# Patient Record
Sex: Female | Born: 1935
Health system: Southern US, Community
[De-identification: ages and names within clinical notes are randomized; demographics above are authoritative.]

## PROBLEM LIST (undated history)

## (undated) DIAGNOSIS — I4891 Unspecified atrial fibrillation: Secondary | ICD-10-CM

## (undated) DIAGNOSIS — J449 Chronic obstructive pulmonary disease, unspecified: Secondary | ICD-10-CM

## (undated) DIAGNOSIS — J45909 Unspecified asthma, uncomplicated: Secondary | ICD-10-CM

## (undated) DIAGNOSIS — F0390 Unspecified dementia without behavioral disturbance: Secondary | ICD-10-CM

---

## 2013-11-10 ENCOUNTER — Emergency Department (HOSPITAL_BASED_OUTPATIENT_CLINIC_OR_DEPARTMENT_OTHER): Payer: Medicare Other

## 2013-11-10 ENCOUNTER — Encounter (HOSPITAL_BASED_OUTPATIENT_CLINIC_OR_DEPARTMENT_OTHER): Payer: Self-pay | Admitting: Emergency Medicine

## 2013-11-10 ENCOUNTER — Inpatient Hospital Stay (HOSPITAL_BASED_OUTPATIENT_CLINIC_OR_DEPARTMENT_OTHER)
Admission: EM | Admit: 2013-11-10 | Discharge: 2013-11-14 | DRG: 492 | Disposition: A | Discharge status: Skilled Nursing Facility | Payer: Medicare Other | Attending: Internal Medicine | Admitting: Internal Medicine

## 2013-11-10 DIAGNOSIS — I4892 Unspecified atrial flutter: Secondary | ICD-10-CM | POA: Diagnosis not present

## 2013-11-10 DIAGNOSIS — J9600 Acute respiratory failure, unspecified whether with hypoxia or hypercapnia: Secondary | ICD-10-CM | POA: Diagnosis not present

## 2013-11-10 DIAGNOSIS — Z9119 Patient's noncompliance with other medical treatment and regimen: Secondary | ICD-10-CM

## 2013-11-10 DIAGNOSIS — F411 Generalized anxiety disorder: Secondary | ICD-10-CM | POA: Diagnosis present

## 2013-11-10 DIAGNOSIS — S82842A Displaced bimalleolar fracture of left lower leg, initial encounter for closed fracture: Secondary | ICD-10-CM

## 2013-11-10 DIAGNOSIS — F03918 Unspecified dementia, unspecified severity, with other behavioral disturbance: Secondary | ICD-10-CM | POA: Diagnosis present

## 2013-11-10 DIAGNOSIS — J8410 Pulmonary fibrosis, unspecified: Secondary | ICD-10-CM | POA: Diagnosis present

## 2013-11-10 DIAGNOSIS — F0391 Unspecified dementia with behavioral disturbance: Secondary | ICD-10-CM | POA: Diagnosis present

## 2013-11-10 DIAGNOSIS — W19XXXA Unspecified fall, initial encounter: Secondary | ICD-10-CM | POA: Diagnosis present

## 2013-11-10 DIAGNOSIS — J449 Chronic obstructive pulmonary disease, unspecified: Secondary | ICD-10-CM | POA: Diagnosis present

## 2013-11-10 DIAGNOSIS — S82843A Displaced bimalleolar fracture of unspecified lower leg, initial encounter for closed fracture: Principal | ICD-10-CM | POA: Diagnosis present

## 2013-11-10 DIAGNOSIS — Z79899 Other long term (current) drug therapy: Secondary | ICD-10-CM

## 2013-11-10 DIAGNOSIS — J4489 Other specified chronic obstructive pulmonary disease: Secondary | ICD-10-CM | POA: Diagnosis present

## 2013-11-10 DIAGNOSIS — J811 Chronic pulmonary edema: Secondary | ICD-10-CM | POA: Diagnosis present

## 2013-11-10 DIAGNOSIS — R0902 Hypoxemia: Secondary | ICD-10-CM

## 2013-11-10 DIAGNOSIS — Z91199 Patient's noncompliance with other medical treatment and regimen due to unspecified reason: Secondary | ICD-10-CM

## 2013-11-10 DIAGNOSIS — Z66 Do not resuscitate: Secondary | ICD-10-CM | POA: Diagnosis present

## 2013-11-10 DIAGNOSIS — J849 Interstitial pulmonary disease, unspecified: Secondary | ICD-10-CM

## 2013-11-10 HISTORY — DX: Chronic obstructive pulmonary disease, unspecified: J44.9

## 2013-11-10 HISTORY — DX: Unspecified dementia, unspecified severity, without behavioral disturbance, psychotic disturbance, mood disturbance, and anxiety: F03.90

## 2013-11-10 LAB — CBC
HEMATOCRIT: 43.2 % (ref 36.0–46.0)
HEMOGLOBIN: 14 g/dL (ref 12.0–15.0)
MCH: 30.8 pg (ref 26.0–34.0)
MCHC: 32.4 g/dL (ref 30.0–36.0)
MCV: 94.9 fL (ref 78.0–100.0)
Platelets: 228 10*3/uL (ref 150–400)
RBC: 4.55 MIL/uL (ref 3.87–5.11)
RDW: 13.1 % (ref 11.5–15.5)
WBC: 7.2 10*3/uL (ref 4.0–10.5)

## 2013-11-10 LAB — DIFFERENTIAL
BASOS PCT: 0 % (ref 0–1)
Basophils Absolute: 0 10*3/uL (ref 0.0–0.1)
EOS PCT: 2 % (ref 0–5)
Eosinophils Absolute: 0.1 10*3/uL (ref 0.0–0.7)
LYMPHS ABS: 2.6 10*3/uL (ref 0.7–4.0)
Lymphocytes Relative: 36 % (ref 12–46)
MONO ABS: 0.6 10*3/uL (ref 0.1–1.0)
Monocytes Relative: 8 % (ref 3–12)
NEUTROS ABS: 3.9 10*3/uL (ref 1.7–7.7)
Neutrophils Relative %: 54 % (ref 43–77)

## 2013-11-10 LAB — URINALYSIS, ROUTINE W REFLEX MICROSCOPIC
Glucose, UA: NEGATIVE mg/dL
Hgb urine dipstick: NEGATIVE
KETONES UR: 40 mg/dL — AB
Leukocytes, UA: NEGATIVE
NITRITE: NEGATIVE
PROTEIN: 30 mg/dL — AB
Specific Gravity, Urine: 1.027 (ref 1.005–1.030)
Urobilinogen, UA: 1 mg/dL (ref 0.0–1.0)
pH: 5.5 (ref 5.0–8.0)

## 2013-11-10 LAB — URINE MICROSCOPIC-ADD ON

## 2013-11-10 LAB — BASIC METABOLIC PANEL
BUN: 20 mg/dL (ref 6–23)
CO2: 26 mEq/L (ref 19–32)
CREATININE: 0.9 mg/dL (ref 0.50–1.10)
Calcium: 9.5 mg/dL (ref 8.4–10.5)
Chloride: 102 mEq/L (ref 96–112)
GFR calc Af Amer: 67 mL/min — ABNORMAL LOW (ref >90)
GFR calc non Af Amer: 58 mL/min — ABNORMAL LOW (ref >90)
Glucose, Bld: 108 mg/dL — ABNORMAL HIGH (ref 70–99)
Potassium: 3.7 mEq/L (ref 3.7–5.3)
Sodium: 145 mEq/L (ref 137–147)

## 2013-11-10 LAB — PRO B NATRIURETIC PEPTIDE: PRO B NATRI PEPTIDE: 1284 pg/mL — AB (ref 0–450)

## 2013-11-10 LAB — TROPONIN I: Troponin I: <0.3 ng/mL (ref <0.30)

## 2013-11-10 NOTE — ED Provider Notes (Signed)
CSN: [id number]     Arrival date & time 11/10/13  1946 History  This chart was scribed for Brooke B. Bernette Mayers, MD by Danella Maiers, ED Scribe. This patient was seen in room MH10/MH10 and the patient's care was started at 7:47 PM.   Chief Complaint  Patient presents with  . Fall  . Leg Pain   The history is provided by the patient and a relative. No language interpreter was used.   HPI Comments: Level 5 caveat due to ?dementia Brooke Phelps is a 80 y.o. female who presents to the Emergency Department complaining of left ankle pain with associated swelling this morning after a fall. Pt has had difficulty bearing weight on the left leg since that time. She denies hitting her head, LOC, neck pain.   Past Medical History  Diagnosis Date  . COPD (chronic obstructive pulmonary disease)   . Dementia    History reviewed. No pertinent past surgical history. History reviewed. No pertinent family history. History  Substance Use Topics  . Smoking status: Never Smoker   . Smokeless tobacco: Not on file  . Alcohol Use: No   OB History   Grav Para Term Preterm Abortions TAB SAB Ect Mult Living                 Review of Systems  Musculoskeletal: Positive for arthralgias (left ankle). Negative for neck pain.  Neurological: Negative for syncope.  Unable to fully assess due to mental status.   Allergies  Review of patient's allergies indicates not on file.  Home Medications   Current Outpatient Rx  Name  Route  Sig  Dispense  Refill  . ALPRAZolam (XANAX) 0.5 MG tablet   Oral   Take 0.5 mg by mouth as needed for anxiety.         Marland Kitchen PARoxetine (PAXIL) 10 MG tablet   Oral   Take 10 mg by mouth daily.          BP 155/75  Pulse 90  Temp(Src) 98 F (36.7 C) (Oral)  Resp 16  Ht 5\' 2"  (1.575 m)  Wt 150 lb (68.04 kg)  BMI 27.43 kg/m2  SpO2 89% Physical Exam  Nursing note and vitals reviewed. Constitutional: She appears well-developed and well-nourished.  HENT:  Head:  Normocephalic and atraumatic.  Eyes: EOM are normal. Pupils are equal, round, and reactive to light.  Neck: Normal range of motion. Neck supple.  Cardiovascular: Normal rate, normal heart sounds and intact distal pulses.   Pulmonary/Chest: Effort normal. No respiratory distress. She has no wheezes. She has no rales.  Rhonchi  Abdominal: Bowel sounds are normal. She exhibits no distension. There is no tenderness.  Musculoskeletal: She exhibits tenderness. She exhibits no edema.  left ankle swollen, ttp, ecchymosis.   Neurological: She is alert. She has normal strength. No cranial nerve deficit or sensory deficit.  Skin: Skin is warm and dry. No rash noted.  Psychiatric: She has a normal mood and affect.    ED Course  Procedures (including critical care time) Medications - No data to display  DIAGNOSTIC STUDIES: Oxygen Saturation is 89% on RA, low by my interpretation.    COORDINATION OF CARE: 8:03 PM- Discussed treatment plan with pt which includes ankle x-ray. Pt agrees to plan.    Labs Review Labs Reviewed  BASIC METABOLIC PANEL - Abnormal; Notable for the following:    Glucose, Bld 108 (*)    GFR calc non Af Amer 58 (*)    GFR calc  Af Amer 67 (*)    All other components within normal limits  URINALYSIS, ROUTINE W REFLEX MICROSCOPIC - Abnormal; Notable for the following:    Color, Urine AMBER (*)    APPearance CLOUDY (*)    Bilirubin Urine MODERATE (*)    Ketones, ur 40 (*)    Protein, ur 30 (*)    All other components within normal limits  URINE MICROSCOPIC-ADD ON - Abnormal; Notable for the following:    Squamous Epithelial / LPF FEW (*)    Bacteria, UA MANY (*)    All other components within normal limits  TROPONIN I  CBC  DIFFERENTIAL  CBC WITH DIFFERENTIAL   Imaging Review Dg Chest 2 View  11/10/2013   CLINICAL DATA:  Shortness of breath, cough.  EXAM: CHEST  2 VIEW  COMPARISON:  None.  FINDINGS: The heart size and mediastinal contours are within normal limits.  No pneumothorax or pleural effusion is noted. Small calcified granuloma is noted in right midlung. Mild central pulmonary vascular congestion is noted with possible bilateral perihilar edema. The visualized skeletal structures are unremarkable.  IMPRESSION: Mild central pulmonary vascular congestion with possible mild bilateral perihilar edema.   Electronically Signed   By: Roque Lias M.D.   On: 11/10/2013 21:24   Dg Ankle Complete Left  11/10/2013   CLINICAL DATA:  Traumatic injury with pain  EXAM: LEFT ANKLE COMPLETE - 3+ VIEW  COMPARISON:  None.  FINDINGS: Diffuse soft tissue swelling is identified. There is an oblique fracture through the distal fibula with mild displacement. Additionally a fracture through the medial malleolus is noted.  IMPRESSION: Bimalleolar fracture with generalized soft tissue swelling.   Electronically Signed   By: Alcide Clever M.D.   On: 11/10/2013 20:24   Ct Head Wo Contrast  11/10/2013   CLINICAL DATA:  Post fall, history of dementia  EXAM: CT HEAD WITHOUT CONTRAST  TECHNIQUE: Contiguous axial images were obtained from the base of the skull through the vertex without intravenous contrast.  COMPARISON:  None.  FINDINGS: Advanced atrophy with sulcal prominence and centralized volume loss with mild-to-moderate commensurate ex vacuo dilatation of the ventricular system. Periventricular hypodensities compatible with microvascular ischemic disease. Given background parenchymal abnormalities, there is no CT evidence of acute superimposed large territory infarct. No intraparenchymal or extra-axial mass or hemorrhage. Incidental note is made of a septum cavum pellucidum. Otherwise, normal configuration of the ventricles and basal cisterns. No midline shift. Intracranial atherosclerosis. An air-fluid level is noted within the left sphenoid sinus. The remaining paranasal sinuses and mastoid air cells appear normally aerated. Regional soft tissues appear normal. Post bilateral cataract  surgery. No displaced calvarial fracture.  IMPRESSION: Advanced atrophy and microvascular ischemic disease without acute intracranial process.   Electronically Signed   By: Simonne Come M.D.   On: 11/10/2013 21:32    EKG Interpretation    Date/Time:  Friday November 10 2013 21:02:01 EST Ventricular Rate:  85 PR Interval:  136 QRS Duration: 78 QT Interval:  394 QTC Calculation: 468 R Axis:   16 Text Interpretation:  Normal sinus rhythm Normal ECG No old tracing to compare Confirmed by [facility]  MD, Brooke [phone]) on 11/10/2013 9:09:02 PM            MDM   1. Bimalleolar fracture, left, closed, initial encounter   2. Hypoxia   3. ILD (interstitial lung disease)     Per daughter-in-law at bedside who is an Charity fundraiser, patient and husband have been living independently in [place]  until recently when family became concerned about their welfare. They had gone to their house yesterday to move them back to the [place] area when the patient had her fall. The DIL is also concerned about patient developing dementia, but states she has no regular medical care. A doctor there recently started her on Paxil for unclear reason and daughter gave her Xanax to facilitate moving her to this area. The patient is hypoxic on RA with recent coughing per daughter. No known fever. She has prior diagnosis of COPD, but has never been a smoker. Had previous admission for UTI as well. Xray images reviewed, will place patient in splint, due to hypoxia and other medical issues will expand workup to include labs, EKG, CXR and Head CT. Anticipate she will require admission for further evaluation of her multiple problems.    I personally performed the services described in this documentation, which was scribed in my presence. The recorded information has been reviewed and is accurate.      Brooke B. Bernette MayersSheldon, MD 11/11/13 1350

## 2013-11-10 NOTE — Progress Notes (Signed)
Performed left brachial arterial puncture to obtain blood for labs.  Pressure held until bleeding stopped.

## 2013-11-10 NOTE — ED Notes (Signed)
Patient transported to X-ray 

## 2013-11-10 NOTE — Progress Notes (Signed)
PENDING ACCEPTANCE TRANFER NOTE:  Call received from:   Dr Bernette [facility]ldon of [facility].  REASON FOR REQUESTING TRANSFER:  No local MD here, having fallen and Fx ankle.    HPI: Elderly patient, fell and hx of her ankle.  Also found to have COPD and a little Hypoxia.  She needs ortho consult, and EDP will speak with ortho.  Will need local set up for her.   PLAN:  According to telephone report, this patient was accepted for transfer to [facility],   [facility] team: 10,  I have requested an order be written to call Flow Manager at [phone] upon patient arrival to the floor for final physician assignment who will do the admission and give admitting orders.  SIGNEHouston Siren: Luciann Gossett, MD [facility]  11/10/2013, 11:08 PM

## 2013-11-10 NOTE — ED Notes (Signed)
Pt family states that this morning she fell, due to pain in her left leg. Pt has been complaining of pain to her left leg for a week. Pt family this evening noticed that her left ankle was swollen and pt could not put any pressure down on her left leg. Pt has pain to touch of left ankle and left calf.

## 2013-11-11 ENCOUNTER — Encounter (HOSPITAL_COMMUNITY): Payer: Self-pay | Admitting: Internal Medicine

## 2013-11-11 ENCOUNTER — Inpatient Hospital Stay (HOSPITAL_COMMUNITY): Payer: Medicare Other | Admitting: Certified Registered Nurse Anesthetist

## 2013-11-11 ENCOUNTER — Encounter (HOSPITAL_COMMUNITY): Payer: Medicare Other | Admitting: Certified Registered Nurse Anesthetist

## 2013-11-11 ENCOUNTER — Encounter (HOSPITAL_COMMUNITY)
Admission: EM | Disposition: A | Discharge status: Skilled Nursing Facility | Payer: Self-pay | Source: Home / Self Care | Attending: Internal Medicine

## 2013-11-11 DIAGNOSIS — S82843A Displaced bimalleolar fracture of unspecified lower leg, initial encounter for closed fracture: Principal | ICD-10-CM

## 2013-11-11 DIAGNOSIS — F0391 Unspecified dementia with behavioral disturbance: Secondary | ICD-10-CM | POA: Diagnosis present

## 2013-11-11 DIAGNOSIS — J8410 Pulmonary fibrosis, unspecified: Secondary | ICD-10-CM

## 2013-11-11 DIAGNOSIS — R0902 Hypoxemia: Secondary | ICD-10-CM

## 2013-11-11 DIAGNOSIS — J849 Interstitial pulmonary disease, unspecified: Secondary | ICD-10-CM | POA: Diagnosis present

## 2013-11-11 DIAGNOSIS — F03918 Unspecified dementia, unspecified severity, with other behavioral disturbance: Secondary | ICD-10-CM | POA: Diagnosis present

## 2013-11-11 HISTORY — PX: ORIF ANKLE FRACTURE: SHX5408

## 2013-11-11 LAB — CBC
HCT: 41.4 % (ref 36.0–46.0)
Hemoglobin: 13.9 g/dL (ref 12.0–15.0)
MCH: 31.7 pg (ref 26.0–34.0)
MCHC: 33.6 g/dL (ref 30.0–36.0)
MCV: 94.3 fL (ref 78.0–100.0)
PLATELETS: 261 10*3/uL (ref 150–400)
RBC: 4.39 MIL/uL (ref 3.87–5.11)
RDW: 13.2 % (ref 11.5–15.5)
WBC: 6.1 10*3/uL (ref 4.0–10.5)

## 2013-11-11 LAB — GLUCOSE, CAPILLARY: Glucose-Capillary: 112 mg/dL — ABNORMAL HIGH (ref 70–99)

## 2013-11-11 LAB — CREATININE, SERUM
CREATININE: 0.77 mg/dL (ref 0.50–1.10)
GFR, EST AFRICAN AMERICAN: 88 mL/min — AB (ref >90)
GFR, EST NON AFRICAN AMERICAN: 76 mL/min — AB (ref >90)

## 2013-11-11 SURGERY — OPEN REDUCTION INTERNAL FIXATION (ORIF) ANKLE FRACTURE
Anesthesia: Monitor Anesthesia Care | Site: Ankle | Laterality: Left

## 2013-11-11 MED ORDER — CEFAZOLIN SODIUM-DEXTROSE 2-3 GM-% IV SOLR
INTRAVENOUS | Status: AC
  Administered 2013-11-11: 09:00:00 2 g via INTRAVENOUS
  Filled 2013-11-11: qty 50

## 2013-11-11 MED ORDER — ONDANSETRON HCL 4 MG/2ML IJ SOLN
4.0000 mg | Freq: Once | INTRAMUSCULAR | Status: DC | PRN
Start: 2013-11-11 — End: 2013-11-11

## 2013-11-11 MED ORDER — ONDANSETRON HCL 4 MG PO TABS: 4.0000 mg | ORAL_TABLET | Freq: Four times a day (QID) | ORAL | Status: DC | PRN

## 2013-11-11 MED ORDER — ONDANSETRON HCL 4 MG/2ML IJ SOLN: 4.0000 mg | Freq: Four times a day (QID) | INTRAMUSCULAR | Status: DC | PRN

## 2013-11-11 MED ORDER — MEPERIDINE HCL 25 MG/ML IJ SOLN: 6.2500 mg | INTRAMUSCULAR | Status: DC | PRN

## 2013-11-11 MED ORDER — METOCLOPRAMIDE HCL 5 MG/ML IJ SOLN
5.0000 mg | Freq: Three times a day (TID) | INTRAMUSCULAR | Status: DC | PRN
  Filled 2013-11-11: qty 2

## 2013-11-11 MED ORDER — HYDROCODONE-ACETAMINOPHEN 5-325 MG PO TABS
1.0000 | ORAL_TABLET | ORAL | Status: DC | PRN
  Administered 2013-11-11 – 2013-11-13 (×6): 2 via ORAL
  Filled 2013-11-11 (×6): qty 2

## 2013-11-11 MED ORDER — PAROXETINE HCL 10 MG PO TABS
10.0000 mg | ORAL_TABLET | Freq: Every day | ORAL | Status: DC
  Administered 2013-11-12 – 2013-11-14 (×3): 10 mg via ORAL
  Filled 2013-11-11 (×4): qty 1

## 2013-11-11 MED ORDER — OXYCODONE HCL 5 MG PO TABS: 5.0000 mg | ORAL_TABLET | Freq: Once | ORAL | Status: DC | PRN

## 2013-11-11 MED ORDER — FENTANYL CITRATE 0.05 MG/ML IJ SOLN
INTRAMUSCULAR | Status: DC | PRN
  Administered 2013-11-11 (×4): 25 ug via INTRAVENOUS

## 2013-11-11 MED ORDER — HYDROMORPHONE HCL PF 1 MG/ML IJ SOLN: 1.0000 mg | INTRAMUSCULAR | Status: DC | PRN

## 2013-11-11 MED ORDER — CEFAZOLIN SODIUM-DEXTROSE 2-3 GM-% IV SOLR
2.0000 g | Freq: Four times a day (QID) | INTRAVENOUS | Status: AC
  Administered 2013-11-11 – 2013-11-12 (×3): 2 g via INTRAVENOUS
  Filled 2013-11-11 (×4): qty 50

## 2013-11-11 MED ORDER — DEXTROSE-NACL 5-0.9 % IV SOLN
INTRAVENOUS | Status: DC
  Administered 2013-11-11 – 2013-11-13 (×2): via INTRAVENOUS

## 2013-11-11 MED ORDER — OXYCODONE HCL 5 MG/5ML PO SOLN: 5.0000 mg | Freq: Once | ORAL | Status: DC | PRN

## 2013-11-11 MED ORDER — PROPOFOL 10 MG/ML IV BOLUS
INTRAVENOUS | Status: DC | PRN
  Administered 2013-11-11: 20 mg via INTRAVENOUS

## 2013-11-11 MED ORDER — HYDROMORPHONE HCL PF 1 MG/ML IJ SOLN: 0.2500 mg | INTRAMUSCULAR | Status: DC | PRN

## 2013-11-11 MED ORDER — HEPARIN SODIUM (PORCINE) 5000 UNIT/ML IJ SOLN
5000.0000 [IU] | Freq: Three times a day (TID) | INTRAMUSCULAR | Status: DC
  Administered 2013-11-11 – 2013-11-14 (×8): 5000 [IU] via SUBCUTANEOUS
  Filled 2013-11-11 (×13): qty 1

## 2013-11-11 MED ORDER — BUPIVACAINE-EPINEPHRINE PF 0.5-1:200000 % IJ SOLN
INTRAMUSCULAR | Status: DC | PRN
  Administered 2013-11-11: 30 mL via PERINEURAL

## 2013-11-11 MED ORDER — LIDOCAINE-EPINEPHRINE (PF) 1.5 %-1:200000 IJ SOLN
INTRAMUSCULAR | Status: DC | PRN
  Administered 2013-11-11: 30 mL via PERINEURAL

## 2013-11-11 MED ORDER — ALPRAZOLAM 0.5 MG PO TABS
0.5000 mg | ORAL_TABLET | Freq: Three times a day (TID) | ORAL | Status: DC | PRN
  Administered 2013-11-13 – 2013-11-14 (×3): 0.5 mg via ORAL
  Filled 2013-11-11 (×2): qty 1
  Filled 2013-11-11: qty 2

## 2013-11-11 MED ORDER — DOCUSATE SODIUM 100 MG PO CAPS
100.0000 mg | ORAL_CAPSULE | Freq: Two times a day (BID) | ORAL | Status: DC
  Administered 2013-11-12 – 2013-11-14 (×5): 100 mg via ORAL
  Filled 2013-11-11 (×9): qty 1

## 2013-11-11 MED ORDER — SODIUM CHLORIDE 0.9 % IV SOLN
INTRAVENOUS | Status: DC
  Administered 2013-11-11: 23:00:00 via INTRAVENOUS

## 2013-11-11 MED ORDER — LACTATED RINGERS IV SOLN
INTRAVENOUS | Status: DC | PRN
  Administered 2013-11-11: 09:00:00 via INTRAVENOUS

## 2013-11-11 MED ORDER — 0.9 % SODIUM CHLORIDE (POUR BTL) OPTIME
TOPICAL | Status: DC | PRN
  Administered 2013-11-11: 1000 mL

## 2013-11-11 MED ORDER — METOPROLOL TARTRATE 1 MG/ML IV SOLN
5.0000 mg | Freq: Three times a day (TID) | INTRAVENOUS | Status: DC
Start: 2013-11-11 — End: 2013-11-12
  Administered 2013-11-11 – 2013-11-12 (×2): 5 mg via INTRAVENOUS
  Filled 2013-11-11 (×5): qty 5

## 2013-11-11 MED ORDER — METOCLOPRAMIDE HCL 10 MG PO TABS
5.0000 mg | ORAL_TABLET | Freq: Three times a day (TID) | ORAL | Status: DC | PRN
  Filled 2013-11-11: qty 1

## 2013-11-11 SURGICAL SUPPLY — 52 items
BANDAGE ESMARK 6X9 LF (GAUZE/BANDAGES/DRESSINGS) IMPLANT
BANDAGE GAUZE ELAST BULKY 4 IN (GAUZE/BANDAGES/DRESSINGS) ×1 IMPLANT
BIT DRILL 2.5X2.75 QC CALB (BIT) ×1 IMPLANT
BNDG CMPR 9X6 STRL LF SNTH (GAUZE/BANDAGES/DRESSINGS) ×1
BNDG COHESIVE 4X5 TAN STRL (GAUZE/BANDAGES/DRESSINGS) ×2 IMPLANT
BNDG ESMARK 6X9 LF (GAUZE/BANDAGES/DRESSINGS) ×2
BNDG GAUZE STRTCH 6 (GAUZE/BANDAGES/DRESSINGS) ×2 IMPLANT
CANISTER SUCTION 2500CC (MISCELLANEOUS) ×1 IMPLANT
CLOTH BEACON ORANGE TIMEOUT ST (SAFETY) ×1 IMPLANT
COVER SURGICAL LIGHT HANDLE (MISCELLANEOUS) ×2 IMPLANT
CUFF TOURNIQUET SINGLE 34IN LL (TOURNIQUET CUFF) IMPLANT
CUFF TOURNIQUET SINGLE 44IN (TOURNIQUET CUFF) IMPLANT
DRAPE C-ARM MINI 42X72 WSTRAPS (DRAPES) ×1 IMPLANT
DRAPE INCISE IOBAN 66X45 STRL (DRAPES) ×2 IMPLANT
DRAPE PROXIMA HALF (DRAPES) ×2 IMPLANT
DRAPE U-SHAPE 47X51 STRL (DRAPES) ×2 IMPLANT
DRSG ADAPTIC 3X8 NADH LF (GAUZE/BANDAGES/DRESSINGS) ×2 IMPLANT
DRSG EMULSION OIL 3X3 NADH (GAUZE/BANDAGES/DRESSINGS) ×1 IMPLANT
DRSG PAD ABDOMINAL 8X10 ST (GAUZE/BANDAGES/DRESSINGS) ×1 IMPLANT
DURAPREP 26ML APPLICATOR (WOUND CARE) ×2 IMPLANT
ELECT REM PT RETURN 9FT ADLT (ELECTROSURGICAL) ×2
ELECTRODE REM PT RTRN 9FT ADLT (ELECTROSURGICAL) ×1 IMPLANT
GLOVE BIOGEL PI IND STRL 9 (GLOVE) ×1 IMPLANT
GLOVE BIOGEL PI INDICATOR 9 (GLOVE) ×1
GLOVE SURG ORTHO 9.0 STRL STRW (GLOVE) ×2 IMPLANT
GOWN PREVENTION PLUS XLARGE (GOWN DISPOSABLE) ×2 IMPLANT
GOWN SRG XL XLNG 56XLVL 4 (GOWN DISPOSABLE) ×2 IMPLANT
GOWN STRL NON-REIN XL XLG LVL4 (GOWN DISPOSABLE) ×2
K-WIRE ACE 1.6X6 (WIRE) ×2
KIT BASIN OR (CUSTOM PROCEDURE TRAY) ×2 IMPLANT
KIT ROOM TURNOVER OR (KITS) ×2 IMPLANT
KWIRE ACE 1.6X6 (WIRE) IMPLANT
MANIFOLD NEPTUNE II (INSTRUMENTS) ×1 IMPLANT
NS IRRIG 1000ML POUR BTL (IV SOLUTION) ×2 IMPLANT
PACK ORTHO EXTREMITY (CUSTOM PROCEDURE TRAY) ×2 IMPLANT
PAD ARMBOARD 7.5X6 YLW CONV (MISCELLANEOUS) ×4 IMPLANT
PADDING CAST COTTON 6X4 STRL (CAST SUPPLIES) ×2 IMPLANT
PLATE LOCK 7H 92 BILAT FIB (Plate) ×1 IMPLANT
SCREW ACE CAN 4.0 40M (Screw) ×1 IMPLANT
SCREW LOCK CORT STAR 3.5X10 (Screw) ×1 IMPLANT
SCREW LOCK CORT STAR 3.5X12 (Screw) ×2 IMPLANT
SCREW LOW PROFILE 12MMX3.5MM (Screw) ×3 IMPLANT
SPONGE GAUZE 4X4 12PLY (GAUZE/BANDAGES/DRESSINGS) ×2 IMPLANT
SPONGE LAP 18X18 X RAY DECT (DISPOSABLE) ×2 IMPLANT
STAPLER VISISTAT 35W (STAPLE) IMPLANT
SUCTION FRAZIER TIP 10 FR DISP (SUCTIONS) ×2 IMPLANT
SUT ETHILON 2 0 PSLX (SUTURE) ×2 IMPLANT
SUT VIC AB 2-0 CTB1 (SUTURE) ×4 IMPLANT
TOWEL OR 17X24 6PK STRL BLUE (TOWEL DISPOSABLE) ×2 IMPLANT
TOWEL OR 17X26 10 PK STRL BLUE (TOWEL DISPOSABLE) ×2 IMPLANT
TUBE CONNECTING 12X1/4 (SUCTIONS) ×2 IMPLANT
WATER STERILE IRR 1000ML POUR (IV SOLUTION) ×1 IMPLANT

## 2013-11-11 NOTE — H&P (Signed)
[facility] History and Physical  Brooke Phelps ZOX:[id number] DOB: 01-22-1928    PCP:   NONE  Chief Complaint: left ankle fracture after a mechanical fall.  HPI: Brooke Phelps is an 80 y.o. female with hx of dementia, progressive decline this year, but still recognizes her son who was at her bedside tonight, hx of COPD, [facility] with a broken L ankle after a mechanical fall in [place] where she used to live.  She was [facility] for consultation with orthopedics.  She has hx of COPD, but was never a smoker.  Evaluation there also included a CXR with mild central pulmonary vascular congestion, and head CT was negative.  Her renal fx test, WBC, Hb, were all normal.  Her UA showed many bacteria, but no WBC and neg leukocytes.  Apparently, she was living with her husband in [place], and her son and daughter in laws had brought them to [place] to take care of them.  She fell and wasn't able to put weight on her leg today.    Rewiew of Systems: Unable.   Past Medical History  Diagnosis Date  . COPD (chronic obstructive pulmonary disease)   . Dementia     History reviewed. No pertinent past surgical history.  Medications:  HOME MEDS: Prior to Admission medications   Medication Sig Start Date End Date Taking? Authorizing Provider  ALPRAZolam Prudy Feeler(XANAX) 0.5 MG tablet Take 0.5 mg by mouth as needed for anxiety.   Yes Historical Provider, MD  PARoxetine (PAXIL) 10 MG tablet Take 10 mg by mouth daily.   Yes Historical Provider, MD     Allergies:  Not on File  Social History:   reports that she has never smoked. She does not have any smokeless tobacco history on file. She reports that she does not drink alcohol or use illicit drugs.  Family History: History reviewed. No pertinent family history.   Physical Exam: Filed Vitals:   11/10/13 1950 11/10/13 2110 11/10/13 2222  BP: 155/75  178/76  Pulse: 90  86  Temp: 98 F (36.7 C)    TempSrc: Oral    Resp: 16  24  Height:  5\' 2"  (1.575 m)    Weight: 68.04 kg (150 lb)    SpO2: 89% 93% 96%   Blood pressure 178/76, pulse 86, temperature 98 F (36.7 C), temperature source Oral, resp. rate 24, height 5\' 2"  (1.575 m), weight 68.04 kg (150 lb), SpO2 96.00%.  GEN:  Pleasant  patient lying in the stretcher in no acute distress; cooperative with exam when asked to open her mouth. PSYCH: does not appear anxious or depressed; affect is appropriate. HEENT: Mucous membranes pink and anicteric; PERRLA; EOM intact; no cervical lymphadenopathy nor thyromegaly or carotid bruit; no JVD; There were no stridor. Neck is very supple. Breasts:: Not examined CHEST WALL: No tenderness CHEST: Normal respiration, clear to auscultation bilaterally.  HEART: Regular rate and rhythm.  There is a soft murmur with a split P2. BACK: No kyphosis or scoliosis; no CVA tenderness ABDOMEN: soft and non-tender; no masses, no organomegaly, normal abdominal bowel sounds; no pannus; no intertriginous candida. There is no rebound and no distention. Rectal Exam: Not done EXTREMITIES: No bone or joint deformity; age-appropriate arthropathy of the hands and knees; no edema; no ulcerations.  There is no calf tenderness. Genitalia: not examined PULSES: 2+ and symmetric SKIN: Normal hydration no rash or ulceration CNS: Cranial nerves 2-12 grossly intact no focal lateralizing neurologic deficit.  Speech is fluent;  uvula elevated with phonation, facial symmetry and tongue midline. DTR are normal bilaterally, cerebella exam is intact, barbinski is negative and strengths are equaled bilaterally.  No sensory loss.   Labs on Admission:  Basic Metabolic Panel:  Recent Labs Lab 11/10/13 2100  NA 145  K 3.7  CL 102  CO2 26  GLUCOSE 108*  BUN 20  CREATININE 0.90  CALCIUM 9.5   Liver Function Tests: No results found for this basename: AST, ALT, ALKPHOS, BILITOT, PROT, ALBUMIN,  in the last 168 hours No results found for this basename: LIPASE, AMYLASE,  in  the last 168 hours No results found for this basename: AMMONIA,  in the last 168 hours CBC:  Recent Labs Lab 11/10/13 2200  WBC 7.2  NEUTROABS 3.9  HGB 14.0  HCT 43.2  MCV 94.9  PLT 228   Cardiac Enzymes:  Recent Labs Lab 11/10/13 2100  TROPONINI <0.30    CBG: No results found for this basename: GLUCAP,  in the last 168 hours   Radiological Exams on Admission: Dg Chest 2 View  11/10/2013   CLINICAL DATA:  Shortness of breath, cough.  EXAM: CHEST  2 VIEW  COMPARISON:  None.  FINDINGS: The heart size and mediastinal contours are within normal limits. No pneumothorax or pleural effusion is noted. Small calcified granuloma is noted in right midlung. Mild central pulmonary vascular congestion is noted with possible bilateral perihilar edema. The visualized skeletal structures are unremarkable.  IMPRESSION: Mild central pulmonary vascular congestion with possible mild bilateral perihilar edema.   Electronically Signed   By: Roque Lias M.D.   On: 11/10/2013 21:24   Dg Ankle Complete Left  11/10/2013   CLINICAL DATA:  Traumatic injury with pain  EXAM: LEFT ANKLE COMPLETE - 3+ VIEW  COMPARISON:  None.  FINDINGS: Diffuse soft tissue swelling is identified. There is an oblique fracture through the distal fibula with mild displacement. Additionally a fracture through the medial malleolus is noted.  IMPRESSION: Bimalleolar fracture with generalized soft tissue swelling.   Electronically Signed   By: Alcide Clever M.D.   On: 11/10/2013 20:24   Ct Head Wo Contrast  11/10/2013   CLINICAL DATA:  Post fall, history of dementia  EXAM: CT HEAD WITHOUT CONTRAST  TECHNIQUE: Contiguous axial images were obtained from the base of the skull through the vertex without intravenous contrast.  COMPARISON:  None.  FINDINGS: Advanced atrophy with sulcal prominence and centralized volume loss with mild-to-moderate commensurate ex vacuo dilatation of the ventricular system. Periventricular hypodensities compatible  with microvascular ischemic disease. Given background parenchymal abnormalities, there is no CT evidence of acute superimposed large territory infarct. No intraparenchymal or extra-axial mass or hemorrhage. Incidental note is made of a septum cavum pellucidum. Otherwise, normal configuration of the ventricles and basal cisterns. No midline shift. Intracranial atherosclerosis. An air-fluid level is noted within the left sphenoid sinus. The remaining paranasal sinuses and mastoid air cells appear normally aerated. Regional soft tissues appear normal. Post bilateral cataract surgery. No displaced calvarial fracture.  IMPRESSION: Advanced atrophy and microvascular ischemic disease without acute intracranial process.   Electronically Signed   By: Simonne Come M.D.   On: 11/10/2013 21:32    EKG: Independently reviewed. NSR with no acute ST-T changes.   Assessment/Plan Present on Admission:  . Dementia with behavioral disturbance . ILD (interstitial lung disease) Left ankle Fx Spit P2. Slight hypoxia.  PLAN:  Will admit her for left ankle Fx.  She will be placed on NPO, orthopedics was  consulted by Dr Bernette Mayers in [facility].  She will be given SQ hep prophylaxis.  For her split P2 in the heart sound, will get ECHO.  This will help assess her LV fx as well.  She has dementia, and will likely benefit taking medication like Exelon patch, but will defer during this acute hospitalization.  Her hypoxia could be from edema, so ECHO will help.  She is otherwise stable.  I discussed code status with family, and determined that she is a DNR.  We ll honor this.  Thank you for asking me to participate in her care.  Other plans as per orders.  Code Status: DNR.   [place] Siren, MD. [facility] Pager [phone] 7pm to 7am.  11/11/2013, 2:05 AM

## 2013-11-11 NOTE — Progress Notes (Signed)
Patient briefly seen earlier today. She had a mechanical fall resulting in a left ankle fracture. Is for repair today with Dr. Lajoyce Cornersuda. Will require PT/OT evals after surgery to determine disposition. Will continue to follow.  Peggye PittEstela Hernandez, MD [facility] Pager: [phone]

## 2013-11-11 NOTE — H&P (Signed)
Brooke Phelps is an 80 y.o. female.   Chief Complaint: Left ankle bimalleolar fracture HPI: Brooke Phelps is an 80 year old woman with dementia who fell in [place] at skilled nursing sustaining a bimalleolar fracture. She was brought to the [facility] in [facility] and was transferred to [facility] for definitive treatment of her medical conditions and ankle fracture.  Past Medical History  Diagnosis Date  . COPD (chronic obstructive pulmonary disease)   . Dementia     History reviewed. No pertinent past surgical history.  History reviewed. No pertinent family history. Social History:  reports that she has never smoked. She does not have any smokeless tobacco history on file. She reports that she does not drink alcohol or use illicit drugs.  Allergies: Not on File  Medications Prior to Admission  Medication Sig Dispense Refill  . ALPRAZolam (XANAX) 0.5 MG tablet Take 0.5 mg by mouth as needed for anxiety.      Marland Kitchen PARoxetine (PAXIL) 10 MG tablet Take 10 mg by mouth daily.        Results for orders placed during the hospital encounter of 11/10/13 (from the past 48 hour(s))  BASIC METABOLIC PANEL     Status: Abnormal   Collection Time    11/10/13  9:00 PM      Result Value Range   Sodium 145  137 - 147 mEq/L   Potassium 3.7  3.7 - 5.3 mEq/L   Chloride 102  96 - 112 mEq/L   CO2 26  19 - 32 mEq/L   Glucose, Bld 108 (*) 70 - 99 mg/dL   BUN 20  6 - 23 mg/dL   Creatinine, Ser 0.90  0.50 - 1.10 mg/dL   Calcium 9.5  8.4 - 10.5 mg/dL   GFR calc non Af Amer 58 (*) >90 mL/min   GFR calc Af Amer 67 (*) >90 mL/min   Comment: (NOTE)     The eGFR has been calculated using the CKD EPI equation.     This calculation has not been validated in all clinical situations.     eGFR's persistently <90 mL/min signify possible Chronic Kidney     Disease.  TROPONIN I     Status: None   Collection Time    11/10/13  9:00 PM      Result Value Range   Troponin I <0.30  <0.30 ng/mL   Comment:             Due to the release kinetics of cTnI,     a negative result within the first hours     of the onset of symptoms does not rule out     myocardial infarction with certainty.     If myocardial infarction is still suspected,     repeat the test at appropriate intervals.  PRO B NATRIURETIC PEPTIDE     Status: Abnormal   Collection Time    11/10/13  9:00 PM      Result Value Range   Pro B Natriuretic peptide (BNP) 1284.0 (*) 0 - 450 pg/mL  URINALYSIS, ROUTINE W REFLEX MICROSCOPIC     Status: Abnormal   Collection Time    11/10/13  9:30 PM      Result Value Range   Color, Urine AMBER (*) YELLOW   Comment: BIOCHEMICALS MAY BE AFFECTED BY COLOR   APPearance CLOUDY (*) CLEAR   Specific Gravity, Urine 1.027  1.005 - 1.030   pH 5.5  5.0 - 8.0   Glucose, UA  NEGATIVE  NEGATIVE mg/dL   Hgb urine dipstick NEGATIVE  NEGATIVE   Bilirubin Urine MODERATE (*) NEGATIVE   Ketones, ur 40 (*) NEGATIVE mg/dL   Protein, ur 30 (*) NEGATIVE mg/dL   Urobilinogen, UA 1.0  0.0 - 1.0 mg/dL   Nitrite NEGATIVE  NEGATIVE   Leukocytes, UA NEGATIVE  NEGATIVE  URINE MICROSCOPIC-ADD ON     Status: Abnormal   Collection Time    11/10/13  9:30 PM      Result Value Range   Squamous Epithelial / LPF FEW (*) RARE   WBC, UA 0-2  <3 WBC/hpf   Bacteria, UA MANY (*) RARE  CBC     Status: None   Collection Time    11/10/13 10:00 PM      Result Value Range   WBC 7.2  4.0 - 10.5 K/uL   RBC 4.55  3.87 - 5.11 MIL/uL   Hemoglobin 14.0  12.0 - 15.0 g/dL   HCT 43.2  36.0 - 46.0 %   MCV 94.9  78.0 - 100.0 fL   MCH 30.8  26.0 - 34.0 pg   MCHC 32.4  30.0 - 36.0 g/dL   RDW 13.1  11.5 - 15.5 %   Platelets 228  150 - 400 K/uL  DIFFERENTIAL     Status: None   Collection Time    11/10/13 10:00 PM      Result Value Range   Neutrophils Relative % 54  43 - 77 %   Lymphocytes Relative 36  12 - 46 %   Monocytes Relative 8  3 - 12 %   Eosinophils Relative 2  0 - 5 %   Basophils Relative 0  0 - 1 %   Neutro Abs 3.9  1.7  - 7.7 K/uL   Lymphs Abs 2.6  0.7 - 4.0 K/uL   Monocytes Absolute 0.6  0.1 - 1.0 K/uL   Eosinophils Absolute 0.1  0.0 - 0.7 K/uL   Basophils Absolute 0.0  0.0 - 0.1 K/uL  CBC     Status: None   Collection Time    11/11/13  5:15 AM      Result Value Range   WBC 6.1  4.0 - 10.5 K/uL   Comment: WHITE COUNT CONFIRMED ON SMEAR     REPEATED TO VERIFY   RBC 4.39  3.87 - 5.11 MIL/uL   Hemoglobin 13.9  12.0 - 15.0 g/dL   HCT 41.4  36.0 - 46.0 %   MCV 94.3  78.0 - 100.0 fL   MCH 31.7  26.0 - 34.0 pg   MCHC 33.6  30.0 - 36.0 g/dL   RDW 13.2  11.5 - 15.5 %   Platelets 261  150 - 400 K/uL  CREATININE, SERUM     Status: Abnormal   Collection Time    11/11/13  5:15 AM      Result Value Range   Creatinine, Ser 0.77  0.50 - 1.10 mg/dL   GFR calc non Af Amer 76 (*) >90 mL/min   GFR calc Af Amer 88 (*) >90 mL/min   Comment: (NOTE)     The eGFR has been calculated using the CKD EPI equation.     This calculation has not been validated in all clinical situations.     eGFR's persistently <90 mL/min signify possible Chronic Kidney     Disease.   Dg Chest 2 View  11/10/2013   CLINICAL DATA:  Shortness of breath, cough.  EXAM: CHEST  2 VIEW  COMPARISON:  None.  FINDINGS: The heart size and mediastinal contours are within normal limits. No pneumothorax or pleural effusion is noted. Small calcified granuloma is noted in right midlung. Mild central pulmonary vascular congestion is noted with possible bilateral perihilar edema. The visualized skeletal structures are unremarkable.  IMPRESSION: Mild central pulmonary vascular congestion with possible mild bilateral perihilar edema.   Electronically Signed   By: Sabino Dick M.D.   On: 11/10/2013 21:24   Dg Ankle Complete Left  11/10/2013   CLINICAL DATA:  Traumatic injury with pain  EXAM: LEFT ANKLE COMPLETE - 3+ VIEW  COMPARISON:  None.  FINDINGS: Diffuse soft tissue swelling is identified. There is an oblique fracture through the distal fibula with mild  displacement. Additionally a fracture through the medial malleolus is noted.  IMPRESSION: Bimalleolar fracture with generalized soft tissue swelling.   Electronically Signed   By: Inez Catalina M.D.   On: 11/10/2013 20:24   Ct Head Wo Contrast  11/10/2013   CLINICAL DATA:  Post fall, history of dementia  EXAM: CT HEAD WITHOUT CONTRAST  TECHNIQUE: Contiguous axial images were obtained from the base of the skull through the vertex without intravenous contrast.  COMPARISON:  None.  FINDINGS: Advanced atrophy with sulcal prominence and centralized volume loss with mild-to-moderate commensurate ex vacuo dilatation of the ventricular system. Periventricular hypodensities compatible with microvascular ischemic disease. Given background parenchymal abnormalities, there is no CT evidence of acute superimposed large territory infarct. No intraparenchymal or extra-axial mass or hemorrhage. Incidental note is made of a septum cavum pellucidum. Otherwise, normal configuration of the ventricles and basal cisterns. No midline shift. Intracranial atherosclerosis. An air-fluid level is noted within the left sphenoid sinus. The remaining paranasal sinuses and mastoid air cells appear normally aerated. Regional soft tissues appear normal. Post bilateral cataract surgery. No displaced calvarial fracture.  IMPRESSION: Advanced atrophy and microvascular ischemic disease without acute intracranial process.   Electronically Signed   By: Sandi Mariscal M.D.   On: 11/10/2013 21:32    Review of Systems  All other systems reviewed and are negative.    Blood pressure 158/71, pulse 91, temperature 97.8 F (36.6 C), temperature source Oral, resp. rate 18, height $RemoveBe'5\' 2"'ywsvwbBfr$  (1.575 m), weight 68.04 kg (150 lb), SpO2 92.00%. Physical Exam  On examination Brooke Phelps has deformity of the ankle. There is no skin breakdown. She has good capillary refill. Radiographs shows a impacted medial malleolar fracture with displaced Weber B. fibular  fracture Assessment/Plan Assessment: Bimalleolar left ankle fracture displaced Weber B. fracture.  Plan: Will plan for open reduction internal fixation do to the displacement of the mortise. Risks and benefits were discussed with the Brooke Phelps's family including infection neurovascular injury nonhealing of the wound nonhealing of the bone need for additional surgery. Brooke Phelps and her family state understand and wish to proceed at this time.  DUDA,MARCUS V 11/11/2013, 8:14 AM

## 2013-11-11 NOTE — Anesthesia Procedure Notes (Signed)
Anesthesia Regional Block:  Popliteal block  Pre-Anesthetic Checklist: ,, timeout performed, Correct Patient, Correct Site, Correct Laterality, Correct Procedure, Correct Position, site marked, Risks and benefits discussed,  Surgical consent,  Pre-op evaluation,  At surgeon's request and post-op pain management  Laterality: Left  Prep: chloraprep       Needles:  Injection technique: Single-shot  Needle Type: Echogenic Stimulator Needle     Needle Length: 10cm 10 cm Needle Gauge: 21 and 21 G    Additional Needles:  Procedures: ultrasound guided (picture in chart) and nerve stimulator Popliteal block  Nerve Stimulator or Paresthesia:  Response: 0.4 mA,   Additional Responses:   Narrative:  Start time: 11/11/2013 9:15 AM End time: 11/11/2013 9:25 AM Injection made incrementally with aspirations every 5 mL.  Performed by: Personally  Anesthesiologist: Arta BruceKevin Dysen Edmondson MD  Additional Notes: Monitors applied. Patient sedated. Sterile prep and drape,hand hygiene and sterile gloves were used. Relevant anatomy identified.Needle position confirmed.Local anesthetic injected incrementally after negative aspiration. Local anesthetic spread visualized around nerve(s). Vascular puncture avoided. No complications. Image printed for medical record.The patient tolerated the procedure well.  Additional Saphenous nerve block performed. 15cc Local Anesthetic mixture placed under ultrasonic guidance along the medio-inferior border of the Sartorious muscle 6 inches above the knee.  No Problems encountered.  Arta BruceKevin Lavaun Greenfield MD

## 2013-11-11 NOTE — Op Note (Signed)
OPERATIVE REPORT  DATE OF SURGERY: 11/11/2013  PATIENT:  Brooke Phelps,  80 y.o. female  PRE-OPERATIVE DIAGNOSIS:  left ankle fracture bimalleolar. Onychomycotic nails x10  POST-OPERATIVE DIAGNOSIS:  left ankle fracture bimalleolar . Onychomycotic nails x10  PROCEDURE:  Procedure(s): OPEN REDUCTION INTERNAL FIXATION (ORIF) ANKLE FRACTURE medial and lateral malleolus. Nails trimmed x10  SURGEON:  Surgeon(s): Nadara MustardMarcus V Anavey Coombes, MD  ANESTHESIA:   regional  EBL:  min ML  SPECIMEN:  No Specimen  TOURNIQUET:  * No tourniquets in log *  PROCEDURE DETAILS: Patient is an 80 year old woman with dementia who fell in [place] sustaining a bimalleolar fracture. The patient's family [place] for [place] and postoperative care. Risks and benefits were discussed with the patient and her son including infection neurovascular injury arthritis wound healing complications DVT need for additional surgery. Patient and her son state they understand and wish to proceed at this time. Description of procedure patient was brought to the operating room and underwent a popliteal block. After adequate levels and anesthesia obtained a timeout was called and the left lower extremity was prepped using DuraPrep and draped into a sterile field. A lateral incision was made this carried down to the fracture site. The fracture was essentially transverse in a lag screw was not placed. The fracture was reduced and stabilized with a locking plate with 3 locking screws distally and 3 compression screws proximally. The wound is irrigated with normal saline the incision was closed using 2-0 nylon. A separate incision was made over the medial malleolus the fracture was reduced a guidewire was placed and a 40 mm partially-threaded screw was placed to stabilize the fracture. C-arm fluoroscopy verified reduction in AP and lateral planes. The wounds were irrigated incisions closed in 2-0 nylon the wounds were covered  with Adaptic orthopedic sponges ABDs dressing Kerlix and Coban. Patient was taken to the PACU in stable condition. Patient had extremely long onychomycotic nails and after the dressing was applied the nails were trimmed x10 without complications.  PLAN OF CARE: Admit to inpatient   PATIENT DISPOSITION:  PACU - hemodynamically stable.   Nadara MustardUDA,Jayse Hodkinson V, MD 11/11/2013 10:08 AM

## 2013-11-11 NOTE — Transfer of Care (Signed)
Immediate Anesthesia Transfer of Care Note  Patient: Brooke Phelps  Procedure(s) Performed: Procedure(s): OPEN REDUCTION INTERNAL FIXATION (ORIF) ANKLE FRACTURE (Left)  Patient Location: PACU  Anesthesia Type:MAC and Regional  Level of Consciousness: patient cooperative and responds to stimulation  Airway & Oxygen Therapy: Patient Spontanous Breathing and Patient connected to nasal cannula oxygen  Post-op Assessment: Report given to PACU RN and Post -op Vital signs reviewed and stable  Post vital signs: Reviewed and stable  Complications: No apparent anesthesia complications

## 2013-11-11 NOTE — Preoperative (Signed)
Beta Blockers   Reason not to administer Beta Blockers:Not Applicable 

## 2013-11-11 NOTE — Progress Notes (Signed)
Dentures removed and given to son at bedside.

## 2013-11-11 NOTE — Anesthesia Postprocedure Evaluation (Signed)
Anesthesia Post Note  Patient: Brooke Phelps  Procedure(s) Performed: Procedure(s) (LRB): OPEN REDUCTION INTERNAL FIXATION (ORIF) ANKLE FRACTURE (Left)  Anesthesia type: general  Patient location: PACU  Post pain: Pain level controlled  Post assessment: Patient's Cardiovascular Status Stable  Last Vitals:  Filed Vitals:   11/11/13 1102  BP: 154/62  Pulse: 84  Temp: 37 C  Resp: 19    Post vital signs: Reviewed and stable  Level of consciousness: sedated  Complications: No apparent anesthesia complications

## 2013-11-11 NOTE — Progress Notes (Signed)
Orthopedic Tech Progress Note Patient Details:  Brooke Phelps 01-Apr-1928 [id number]  Ortho Devices Type of Ortho Device: CAM walker Ortho Device/Splint Location: lle Ortho Device/Splint Interventions: Application   Brooke Phelps 11/11/2013, 1:20 PM

## 2013-11-11 NOTE — Anesthesia Preprocedure Evaluation (Addendum)
Anesthesia Evaluation  Patient identified by MRN, date of birth, ID band Patient confused    Reviewed: Allergy & Precautions, H&P , NPO status , Patient's Chart, lab work & pertinent test results  Airway Mallampati: II TM Distance: >3 FB Neck ROM: Full    Dental  (+) Upper Dentures   Pulmonary pneumonia -, COPD         Cardiovascular + dysrhythmias Atrial Fibrillation Rhythm:Irregular Rate:Normal  BNP elevated, CXR shows mild edema. Pt has a chronic cough. ? Incipient heart failure. Will proceed with regional.   Neuro/Psych negative neurological ROS     GI/Hepatic negative GI ROS, Neg liver ROS,   Endo/Other  negative endocrine ROS  Renal/GU negative Renal ROS     Musculoskeletal   Abdominal Normal abdominal exam  (+)   Peds  Hematology negative hematology ROS (+)   Anesthesia Other Findings   Reproductive/Obstetrics                          Anesthesia Physical Anesthesia Plan  ASA: III  Anesthesia Plan: Regional   Post-op Pain Management:    Induction: Intravenous  Airway Management Planned: Natural Airway  Additional Equipment:   Intra-op Plan:   Post-operative Plan:   Informed Consent: I have reviewed the patients History and Physical, chart, labs and discussed the procedure including the risks, benefits and alternatives for the proposed anesthesia with the patient or authorized representative who has indicated his/her understanding and acceptance.   Dental advisory given  Plan Discussed with: CRNA and Surgeon  Anesthesia Plan Comments:        Anesthesia Quick Evaluation

## 2013-11-12 ENCOUNTER — Inpatient Hospital Stay (HOSPITAL_COMMUNITY): Payer: Medicare Other

## 2013-11-12 DIAGNOSIS — I4892 Unspecified atrial flutter: Secondary | ICD-10-CM

## 2013-11-12 DIAGNOSIS — I369 Nonrheumatic tricuspid valve disorder, unspecified: Secondary | ICD-10-CM

## 2013-11-12 LAB — BLOOD GAS, ARTERIAL
Acid-Base Excess: 6.6 mmol/L — ABNORMAL HIGH (ref 0.0–2.0)
Bicarbonate: 31.7 mEq/L — ABNORMAL HIGH (ref 20.0–24.0)
DRAWN BY: 36527
O2 CONTENT: 2 L/min
O2 Saturation: 94.4 %
PATIENT TEMPERATURE: 98.6
TCO2: 33.4 mmol/L (ref 0–100)
pCO2 arterial: 55.3 mmHg — ABNORMAL HIGH (ref 35.0–45.0)
pH, Arterial: 7.377 (ref 7.350–7.450)
pO2, Arterial: 72.5 mmHg — ABNORMAL LOW (ref 80.0–100.0)

## 2013-11-12 LAB — GLUCOSE, CAPILLARY
GLUCOSE-CAPILLARY: 109 mg/dL — AB (ref 70–99)
Glucose-Capillary: 91 mg/dL (ref 70–99)
Glucose-Capillary: 141 mg/dL — ABNORMAL HIGH (ref 70–99)

## 2013-11-12 MED ORDER — METOPROLOL TARTRATE 12.5 MG HALF TABLET
12.5000 mg | ORAL_TABLET | Freq: Two times a day (BID) | ORAL | Status: DC
  Administered 2013-11-12 – 2013-11-13 (×3): 12.5 mg via ORAL
  Filled 2013-11-12 (×4): qty 1

## 2013-11-12 MED ORDER — PNEUMOCOCCAL VAC POLYVALENT 25 MCG/0.5ML IJ INJ
0.5000 mL | INJECTION | INTRAMUSCULAR | Status: DC
  Filled 2013-11-12: qty 0.5

## 2013-11-12 MED ORDER — INFLUENZA VAC SPLIT QUAD 0.5 ML IM SUSP
0.5000 mL | INTRAMUSCULAR | Status: AC
  Administered 2013-11-13: 0.5 mL via INTRAMUSCULAR
  Filled 2013-11-12: qty 0.5

## 2013-11-12 NOTE — Progress Notes (Signed)
Utilization review completed.  

## 2013-11-12 NOTE — Evaluation (Signed)
Physical Therapy Evaluation Patient Details Name: Brooke Phelps [place] MRN: [id number] DOB: [birth year] Today's Date: 11/12/2013 Time: 0920-0950 PT Time Calculation (min): 30 min  PT Assessment / Plan / Recommendation History of Present Illness  patient fell and sustained left ankle fx, ORIF on 11/11/13  Clinical Impression  Patient did well with mobility today, mostly limited by pain.  Feel that patient will steadily improve as pain decreases and reach min assist for transfers.  Agree with short term SNF as MD wants limited weight bearing on left and thus patient will mostly be bed to chair transfers only.  Will benefit from PT to increase independence with mobility.    PT Assessment  Patient needs continued PT services    Follow Up Recommendations  SNF    Does the patient have the potential to tolerate intense rehabilitation      Barriers to Discharge        Equipment Recommendations  None recommended by PT    Recommendations for Other Services     Frequency Min 3X/week    Precautions / Restrictions Precautions Precautions: Fall Required Braces or Orthoses: Other Brace/Splint Other Brace/Splint: cam walker left leg Restrictions Weight Bearing Restrictions: Yes LLE Weight Bearing: Weight bearing as tolerated Other Position/Activity Restrictions: MD would prefer limited WB but is allowing WBAT due to dementia.  Limit weight bearing on left.   Pertinent Vitals/Pain Patient with pain throughout session.  RN gave pain meds at beginning of session.      Mobility  Bed Mobility Overal bed mobility: Needs Assistance Bed Mobility: Supine to Sit Supine to sit: Min assist Transfers Overall transfer level: Needs assistance Equipment used: None Transfers: Stand Pivot Transfers Stand pivot transfers: Mod assist    Exercises     PT Diagnosis: Acute pain;Difficulty walking  PT Problem List: Decreased activity tolerance;Decreased mobility;Decreased knowledge of use of DME;Pain PT  Treatment Interventions: DME instruction;Gait training;Functional mobility training;Therapeutic activities     PT Goals(Current goals can be found in the care plan section) Acute Rehab PT Goals Patient Stated Goal: none per patient PT Goal Formulation: With patient/family Time For Goal Achievement: 11/26/13 Potential to Achieve Goals: Good  Visit Information  Last PT Received On: 11/12/13 Assistance Needed: +1 History of Present Illness: patient fell and sustained left ankle fx, ORIF on 11/11/13       Prior Functioning  Home Living Family/patient expects to be discharged to:: Skilled nursing facility Additional Comments: hopes to return to home once able to fully weight bearing and mobilize Prior Function Level of Independence: Independent Communication Communication: No difficulties    Cognition  Cognition Arousal/Alertness: Awake/alert Behavior During Therapy: Anxious Overall Cognitive Status: History of cognitive impairments - at baseline    Extremity/Trunk Assessment Upper Extremity Assessment Upper Extremity Assessment: Overall WFL for tasks assessed Lower Extremity Assessment Lower Extremity Assessment: Overall WFL for tasks assessed;LLE deficits/detail LLE: Unable to fully assess due to immobilization;Unable to fully assess due to pain   Balance    End of Session PT - End of Session Activity Tolerance: Patient limited by pain Patient left: in chair;with call bell/phone within reach;with family/visitor present Nurse Communication: Mobility status  GP     Olivia CanterMoton, Cianni Manny M, [place] CarolinaPT [phone] 11/12/2013, 9:56 AM

## 2013-11-12 NOTE — Progress Notes (Signed)
[facility] PROGRESS NOTE  Brooke Phelps ZOX:[id number] DOB: [birth year] DOA: 11/10/2013 PCP: No primary provider on file.  Assessment/Plan: Left Ankle Fracture -Management as per ortho. -Will likely need SNF.  Transient A Flutter -After surgery. -Converted back to NSR after a few hours. -Continue PO metoprolol. -See no need for chronic anticoagulation unless this were to recur.   Pulmonary Edema -Noted on admission CXR. -ECHO pending to eval for CHF. -Repeat CXR shows edema has resolved.  Hypoxemia -Will check an ABG. -Per daughter she has been told she has COPD but refuses to treat it.  Code Status: Full COde Family Communication: Discussed with multiple family members at bedside, including husband, son and daughter.  Disposition Plan: Likely SNF.   Consultants:  Ortho   Antibiotics:  None   Subjective: No complaints  Objective: Filed Vitals:   11/11/13 1811 11/11/13 2000 11/12/13 0511 11/12/13 0916  BP: 117/65 140/72 153/73 166/68  Pulse:  80 76 68  Temp:  98.2 F (36.8 C) 98.1 F (36.7 C)   TempSrc:  Oral Oral   Resp:  16 16   Height:  5\' 2"  (1.575 m)    Weight:      SpO2:  90% 94%    No intake or output data in the 24 hours ending 11/12/13 1618 Filed Weights   11/10/13 1950  Weight: 68.04 kg (150 lb)    Exam:   General:  AA Ox2  Cardiovascular: RRR  Respiratory: ronchorus BS  Abdomen: S/NT/ND/+BS  Extremities: 1+ edema bilaterally   Neurologic:  Moves all 4 spontaneously.  Data Reviewed: Basic Metabolic Panel:  Recent Labs Lab 11/10/13 2100 11/11/13 0515  NA 145  --   K 3.7  --   CL 102  --   CO2 26  --   GLUCOSE 108*  --   BUN 20  --   CREATININE 0.90 0.77  CALCIUM 9.5  --    Liver Function Tests: No results found for this basename: AST, ALT, ALKPHOS, BILITOT, PROT, ALBUMIN,  in the last 168 hours No results found for this basename: LIPASE, AMYLASE,  in the last 168 hours No results found for this basename:  AMMONIA,  in the last 168 hours CBC:  Recent Labs Lab 11/10/13 2200 11/11/13 0515  WBC 7.2 6.1  NEUTROABS 3.9  --   HGB 14.0 13.9  HCT 43.2 41.4  MCV 94.9 94.3  PLT 228 261   Cardiac Enzymes:  Recent Labs Lab 11/10/13 2100  TROPONINI <0.30   BNP (last 3 results)  Recent Labs  11/10/13 2100  PROBNP 1284.0*   CBG:  Recent Labs Lab 11/11/13 2040 11/12/13 0750 11/12/13 1108  GLUCAP 112* 109* 91    No results found for this or any previous visit (from the past 240 hour(s)).   Studies: Dg Chest 2 View  11/10/2013   CLINICAL DATA:  Shortness of breath, cough.  EXAM: CHEST  2 VIEW  COMPARISON:  None.  FINDINGS: The heart size and mediastinal contours are within normal limits. No pneumothorax or pleural effusion is noted. Small calcified granuloma is noted in right midlung. Mild central pulmonary vascular congestion is noted with possible bilateral perihilar edema. The visualized skeletal structures are unremarkable.  IMPRESSION: Mild central pulmonary vascular congestion with possible mild bilateral perihilar edema.   Electronically Signed   By: Roque Lias M.D.   On: 11/10/2013 21:24   Dg Ankle Complete Left  11/10/2013   CLINICAL DATA:  Traumatic injury with pain  EXAM: LEFT  ANKLE COMPLETE - 3+ VIEW  COMPARISON:  None.  FINDINGS: Diffuse soft tissue swelling is identified. There is an oblique fracture through the distal fibula with mild displacement. Additionally a fracture through the medial malleolus is noted.  IMPRESSION: Bimalleolar fracture with generalized soft tissue swelling.   Electronically Signed   By: Alcide CleverMark  Lukens M.D.   On: 11/10/2013 20:24   Ct Head Wo Contrast  11/10/2013   CLINICAL DATA:  Post fall, history of dementia  EXAM: CT HEAD WITHOUT CONTRAST  TECHNIQUE: Contiguous axial images were obtained from the base of the skull through the vertex without intravenous contrast.  COMPARISON:  None.  FINDINGS: Advanced atrophy with sulcal prominence and centralized  volume loss with mild-to-moderate commensurate ex vacuo dilatation of the ventricular system. Periventricular hypodensities compatible with microvascular ischemic disease. Given background parenchymal abnormalities, there is no CT evidence of acute superimposed large territory infarct. No intraparenchymal or extra-axial mass or hemorrhage. Incidental note is made of a septum cavum pellucidum. Otherwise, normal configuration of the ventricles and basal cisterns. No midline shift. Intracranial atherosclerosis. An air-fluid level is noted within the left sphenoid sinus. The remaining paranasal sinuses and mastoid air cells appear normally aerated. Regional soft tissues appear normal. Post bilateral cataract surgery. No displaced calvarial fracture.  IMPRESSION: Advanced atrophy and microvascular ischemic disease without acute intracranial process.   Electronically Signed   By: Simonne ComeJohn  Watts M.D.   On: 11/10/2013 21:32   Dg Chest Port 1 View  11/12/2013   CLINICAL DATA:  Hypoxia, edema, COPD and cough. Status post ORIF of the left ankle.  EXAM: PORTABLE CHEST - 1 VIEW  COMPARISON:  11/10/2013  FINDINGS: Stable small calcified granuloma of the right lung. No edema, infiltrate or pleural effusion is identified. The heart size is stable and within normal limits.  IMPRESSION: No acute findings.   Electronically Signed   By: Irish LackGlenn  Yamagata M.D.   On: 11/12/2013 14:33    Scheduled Meds: . docusate sodium  100 mg Oral BID  . heparin  5,000 Units Subcutaneous Q8H  . [START ON 06-20-202015] influenza vac split quadrivalent PF  0.5 mL Intramuscular Tomorrow-1000  . metoprolol tartrate  12.5 mg Oral BID  . PARoxetine  10 mg Oral Daily  . [START ON 06-20-202015] pneumococcal 23 valent vaccine  0.5 mL Intramuscular Tomorrow-1000   Continuous Infusions: . sodium chloride 20 mL/hr at 11/11/13 2236  . dextrose 5 % and 0.9% NaCl 50 mL/hr at 11/11/13 0225    Principal Problem:   Bimalleolar ankle fracture Active Problems:    Hypoxia   Dementia with behavioral disturbance   ILD (interstitial lung disease)   Atrial flutter    Time spent: 35 minutes. Greater than 50% of this time was spent in direct contact with the patient coordinating care.    Chaya JanHERNANDEZ ACOSTA,ESTELA  [facility] Pager [phone]  If 7PM-7AM, please contact night-coverage at [email or web address], password [email or web address] 11/12/2013, 4:18 PM  LOS: 2 days

## 2013-11-12 NOTE — Progress Notes (Signed)
Late entry for 11/11/13 Received call from RN that when ECHO tech went in to perform 2D ECHO her HR was in the 150s. EKG ordered that confirmed a flutter with a HR in the 140s. Have started on metoprolol and have requested a transfer to telemetry to monitor HR. Will continue to follow.  Peggye PittEstela Hernandez, MD [facility] Pager: [phone]

## 2013-11-12 NOTE — Progress Notes (Signed)
Patient ID: Brooke Phelps, female   DOB: 16-May-1928, 80 y.o.   MRN: [id number] Postoperative day 1 open reduction internal fixation left ankle bimalleolar fracture. Patient can be compliant with protected weightbearing on the left lower extremity so she may be weightbearing as tolerated with the fracture boot in place. Minimize weightbearing on the left lower extremity. Plan for discharge to skilled nursing. Keep dressing clean and dry. I will followup in the office in one week.

## 2013-11-12 NOTE — Progress Notes (Signed)
Echo Lab  2D Echocardiogram completed.  Clairessa Boulet L Nashira Mcglynn, RDCS 11/12/2013 1:58 PM   

## 2013-11-13 ENCOUNTER — Encounter (HOSPITAL_COMMUNITY): Payer: Self-pay | Admitting: Orthopedic Surgery

## 2013-11-13 DIAGNOSIS — F0391 Unspecified dementia with behavioral disturbance: Secondary | ICD-10-CM

## 2013-11-13 DIAGNOSIS — F03918 Unspecified dementia, unspecified severity, with other behavioral disturbance: Secondary | ICD-10-CM

## 2013-11-13 LAB — BASIC METABOLIC PANEL
BUN: 14 mg/dL (ref 6–23)
CALCIUM: 8.6 mg/dL (ref 8.4–10.5)
CO2: 30 mEq/L (ref 19–32)
CREATININE: 0.64 mg/dL (ref 0.50–1.10)
Chloride: 100 mEq/L (ref 96–112)
GFR calc non Af Amer: 80 mL/min — ABNORMAL LOW (ref >90)
GLUCOSE: 86 mg/dL (ref 70–99)
Potassium: 3.4 mEq/L — ABNORMAL LOW (ref 3.7–5.3)
Sodium: 142 mEq/L (ref 137–147)

## 2013-11-13 LAB — CBC
HCT: 38.8 % (ref 36.0–46.0)
Hemoglobin: 12.6 g/dL (ref 12.0–15.0)
MCH: 30.3 pg (ref 26.0–34.0)
MCHC: 32.5 g/dL (ref 30.0–36.0)
MCV: 93.3 fL (ref 78.0–100.0)
PLATELETS: 218 10*3/uL (ref 150–400)
RBC: 4.16 MIL/uL (ref 3.87–5.11)
RDW: 13.1 % (ref 11.5–15.5)
WBC: 5.5 10*3/uL (ref 4.0–10.5)

## 2013-11-13 MED ORDER — SODIUM CHLORIDE 0.9 % IV BOLUS (SEPSIS)
250.0000 mL | Freq: Once | INTRAVENOUS | Status: AC
  Administered 2013-11-13: 250 mL via INTRAVENOUS

## 2013-11-13 MED ORDER — METOPROLOL TARTRATE 25 MG PO TABS
25.0000 mg | ORAL_TABLET | Freq: Two times a day (BID) | ORAL | Status: DC
  Administered 2013-11-13 (×2): 25 mg via ORAL
  Filled 2013-11-13 (×4): qty 1

## 2013-11-13 MED ORDER — ACETAMINOPHEN 500 MG PO TABS
1000.0000 mg | ORAL_TABLET | Freq: Three times a day (TID) | ORAL | Status: DC
  Administered 2013-11-13 – 2013-11-14 (×3): 1000 mg via ORAL
  Filled 2013-11-13 (×5): qty 2

## 2013-11-13 MED ORDER — ASPIRIN 325 MG PO TABS
325.0000 mg | ORAL_TABLET | Freq: Every day | ORAL | Status: DC
  Administered 2013-11-13 – 2013-11-14 (×2): 325 mg via ORAL
  Filled 2013-11-13 (×2): qty 1

## 2013-11-13 MED ORDER — TRAMADOL HCL 50 MG PO TABS
50.0000 mg | ORAL_TABLET | Freq: Four times a day (QID) | ORAL | Status: DC | PRN
  Administered 2013-11-13: 50 mg via ORAL
  Filled 2013-11-13: qty 1

## 2013-11-13 MED ORDER — SODIUM CHLORIDE 0.9 % IV SOLN: INTRAVENOUS | Status: DC

## 2013-11-13 MED ORDER — LEVALBUTEROL HCL 0.63 MG/3ML IN NEBU
0.6300 mg | INHALATION_SOLUTION | Freq: Four times a day (QID) | RESPIRATORY_TRACT | Status: DC
  Administered 2013-11-13 – 2013-11-14 (×2): 0.63 mg via RESPIRATORY_TRACT
  Filled 2013-11-13 (×6): qty 3

## 2013-11-13 NOTE — Clinical Social Work Placement (Addendum)
Clinical Social Work Department  CLINICAL SOCIAL WORK PLACEMENT NOTE   Patient: Brooke Phelps  Account Number:  [id number] Admit date: 11/10/13 Clinical Social Worker: Sabino NiemannAmy Valjean Ruppel LCSWA Date/time: April 21, 202015 11:30 AM  Clinical Social Work is seeking post-discharge placement for this patient at the following level of care: SKILLED NURSING (*CSW will update this form in Epic as items are completed)  April 21, 202015 Patient/family provided with [facility] of Clinical Social Work's list of facilities offering this level of care within the geographic area requested by the patient (or if unable, by the patient's family).  April 21, 202015  Patient/family informed of their freedom to choose among providers that offer the needed level of care, that participate in Medicare, Medicaid or managed care program needed by the patient, have an available bed and are willing to accept the patient.  April 21, 202015 Patient/family informed of [facility]' ownership interest in [facility], as well as of the fact that they are under no obligation to receive care at this facility.  PASARR submitted to EDS on 11/14/2013 PASARR number received from EDS on 11/14/2013  FL2 transmitted to all facilities in geographic area requested by pt/family on April 21, 202015   FL2 transmitted to all facilities within larger geographic area on  Patient informed that his/her managed care company has contracts with or will negotiate with certain facilities, including the following:  Patient/family informed of bed offers received:  11/14/2013 [facility]  Patient to be transferred to on 11/14/2013 [facility] The following physician request were entered in Epic:  Additional Comments:

## 2013-11-13 NOTE — Progress Notes (Signed)
Physical Therapy Treatment Patient Details Name: Brooke Phelps MRN: [id number] DOB: 05/09/1928 Today's Date: 03/03/202015 Time: [id number] PT Time Calculation (min): 29 min  PT Assessment / Plan / Recommendation  History of Present Illness patient fell and sustained left ankle fx, ORIF on 11/11/13   PT Comments   Pt continues to demonstrate increased anxiety and pain with being upright.  She was able to stand at min/mod assist, however once in standing, requires mod assist to complete transfer with daughter in law providing verbal encouragement for pt.    Follow Up Recommendations  SNF     Does the patient have the potential to tolerate intense rehabilitation     Barriers to Discharge        Equipment Recommendations  None recommended by PT    Recommendations for Other Services    Frequency Min 3X/week   Progress towards PT Goals Progress towards PT goals: Progressing toward goals  Plan Current plan remains appropriate    Precautions / Restrictions Precautions Precautions: Fall Required Braces or Orthoses: Other Brace/Splint Other Brace/Splint: cam walker left leg Restrictions Weight Bearing Restrictions: Yes LLE Weight Bearing: Weight bearing as tolerated Other Position/Activity Restrictions: MD would prefer limited WB but is allowing WBAT due to dementia.  Limit weight bearing on left.   Pertinent Vitals/Pain Per RN, pt received pain meds prior to session.  Also applied ice pack inside of Cam boot at end of session.     Mobility  Bed Mobility Overal bed mobility: Needs Assistance Bed Mobility: Supine to Sit Supine to sit: Min assist General bed mobility comments: Pt able to bring BLEs out of bed on her own, however requires min HHA to elevate trunk into sitting, as well as mod assist to scoot hips to EOB.  Transfers Overall transfer level: Needs assistance Equipment used: Rolling walker (2 wheeled) Transfers: Stand Pivot Transfers Stand pivot transfers: Mod  assist General transfer comment: Pt does well standing, requiring min to mod assist, however once in standing, demonstrates increased anxiety and pain in ankle (probable) and requires mod assist at hips to maintain standing, as she demonstrates "bouncing" when in standing.  Had daughter in law provide encouragement during transfer in order for pt to continue with transfer.  Also attempted use of RW, however due to dementia, feel she will not be able to use one until pain decreases? Ambulation/Gait Ambulation/Gait assistance:  (not tested)    Exercises     PT Diagnosis:    PT Problem List:   PT Treatment Interventions:     PT Goals (current goals can now be found in the care plan section) Acute Rehab PT Goals PT Goal Formulation: With patient/family Time For Goal Achievement: 11/26/13 Potential to Achieve Goals: Good  Visit Information  Last PT Received On: 11/13/13 Assistance Needed: +1 History of Present Illness: patient fell and sustained left ankle fx, ORIF on 11/11/13    Subjective Data      Cognition  Cognition Arousal/Alertness: Awake/alert Behavior During Therapy: Anxious Overall Cognitive Status: History of cognitive impairments - at baseline Memory: Decreased recall of precautions;Decreased short-term memory    Balance     End of Session PT - End of Session Equipment Utilized During Treatment: Gait belt Activity Tolerance: Patient limited by pain Patient left: in chair;with call bell/phone within reach;with family/visitor present Nurse Communication: Mobility status   GP     Vista Deckarcell, Naszir Cott Ann 03/03/202015, 9:52 AM

## 2013-11-13 NOTE — Clinical Social Work Psychosocial (Signed)
Clinical Social Work Department  BRIEF PSYCHOSOCIAL ASSESSMENT  Patient: Brooke Phelps  Account Number: [id number]   Admit date: 11/10/12 Clinical Social Worker Rhea Pink, MSW Date/Time: 11/13/2013 Referred by: Physician Date Referred: 11/12/13 Referred for   SNF Placement   Other Referral:  Interview type: Patient's daughter. Patient has dementia Other interview type: PSYCHOSOCIAL DATA  Living Status: Family Admitted from facility:  Level of care:  Primary support name: Brooke Phelps Primary support relationship to patient: Son Degree of support available:  Strong and vested  CURRENT CONCERNS  Current Concerns   Post-Acute Placement   Other Concerns:  SOCIAL WORK ASSESSMENT / PLAN  CSW met with pt re: PT recommendation for SNF.   Pt lives with family  CSW explained placement process and answered questions.   Pt reports [facility]  as their preference    CSW completed FL2 and initiated SNF search.     Assessment/plan status: Information/Referral to [employer]  Other assessment/ plan:  Information/referral to community resources:  SNF     PATIENT'S/FAMILY'S RESPONSE TO PLAN OF CARE:  Pt's daughter  reports that the patient is agreeable to [facility] SNF in order to increase strength and independence with mobility prior to returning home. Patient expressed anxiety over placement but  verbalized understanding of placement process and appreciation for CSW assist.   Rhea Pink, MSW, [place]

## 2013-11-13 NOTE — Progress Notes (Signed)
PATIENT DETAILS Name: Brooke Phelps Age: 80 y.o. Sex: female Date of Birth: 06-20-1928 Admit Date: 11/10/2013 Admitting Physician Houston Siren, MD PCP:No primary provider on file.  Subjective: Pleasantly confused- but is able to answer some questions appropriately. Husband and daughter at bedside.  Assessment/Plan: Principal Problem:  Left Bimalleolar ankle fracture - POD 2-open reduction and internal fixation of left ankle fracture - Defer care to orthopedics - This occurred after a mechanical fall  Paroxysmal atrial flutter/atrial fibrillation - Currently sinus rhythm - Continue with metoprolol for rate control - Given history of dementia, fall causing left ankle fracture-will not anticoagulate. Family aware of risk of catastrophic embolic phenomenon including CVA, accepting chances on just aspirin. - Per family, and they're aware of this issue, previously she was on amiodarone and Cardizem, however she has been noncompliant with his medications. - Echocardiogram done on 11/12/13 shows EF around 45-50%.  Acute hypoxic respiratory failure - Multifactorial-? Suspected COPD, atrial flutter, atelectasis - Apparently history of COPD in the past for Dr. Hardie Shackleton note, however a nonsmoker - Will start nebulized bronchodilators, incentive spirometry.    Dementia with behavioral disturbance - Currently stable  Disposition: Remain inpatient- SNF on discharge  DVT Prophylaxis: Prophylactic  Heparin   Code Status: Full code  Family Communication Daughter and husband at bedside  Procedures:  Open reduction internal fixation of left ankle on 1/10  CONSULTS:  orthopedic surgery  Time spent 40 minutes-which includes 50% of the time with face-to-face with patient/ family and coordinating care related to the above assessment and plan.   MEDICATIONS: Scheduled Meds: . docusate sodium  100 mg Oral BID  . heparin  5,000 Units Subcutaneous Q8H  . influenza vac split  quadrivalent PF  0.5 mL Intramuscular Tomorrow-1000  . metoprolol tartrate  12.5 mg Oral BID  . PARoxetine  10 mg Oral Daily  . pneumococcal 23 valent vaccine  0.5 mL Intramuscular Tomorrow-1000   Continuous Infusions: . sodium chloride 20 mL/hr at 11/11/13 2236  . dextrose 5 % and 0.9% NaCl 50 mL/hr at 11/11/13 0225   PRN Meds:.ALPRAZolam, HYDROcodone-acetaminophen, HYDROmorphone (DILAUDID) injection, metoCLOPramide (REGLAN) injection, metoCLOPramide, ondansetron (ZOFRAN) IV, ondansetron  Antibiotics: Anti-infectives   Start     Dose/Rate Route Frequency Ordered Stop   11/11/13 1330  ceFAZolin (ANCEF) IVPB 2 g/50 mL premix     2 g 100 mL/hr over 30 Minutes Intravenous Every 6 hours 11/11/13 1302 11/12/13 0745       PHYSICAL EXAM: Vital signs in last 24 hours: Filed Vitals:   11/12/13 0916 11/12/13 2100 11/13/13 0600 11/13/13 1111  BP: 166/68 184/77 161/67 127/51  Pulse: 68 65 79 72  Temp:  97.8 F (36.6 C) 98.3 F (36.8 C)   TempSrc:      Resp:  15 16   Height:      Weight:      SpO2:  94% 90%     Weight change:  Filed Weights   11/10/13 1950  Weight: 68.04 kg (150 lb)   Body mass index is 27.43 kg/(m^2).   Gen Exam: Awake and somewhat pleasantly confused, with clear speech.  Neck: Supple, No JVD.   Chest: Good air entry bilaterally, few bibasilar rales.  CVS: S1 S2 Regular, no murmurs.  Abdomen: soft, BS +, non tender, non distended.  Extremities: no edema, lower extremities warm to touch. Neurologic: Non Focal.   Skin: No Rash.   Wounds: N/A.    Intake/Output from previous day:  Intake/Output Summary (Last 24 hours) at  11/13/13 1410 Last data filed at 11/13/13 1225  Gross per 24 hour  Intake    480 ml  Output    850 ml  Net   -370 ml     LAB RESULTS: CBC  Recent Labs Lab 11/10/13 2200 11/11/13 0515 11/13/13 0625  WBC 7.2 6.1 5.5  HGB 14.0 13.9 12.6  HCT 43.2 41.4 38.8  PLT 228 261 218  MCV 94.9 94.3 93.3  MCH 30.8 31.7 30.3  MCHC 32.4  33.6 32.5  RDW 13.1 13.2 13.1  LYMPHSABS 2.6  --   --   MONOABS 0.6  --   --   EOSABS 0.1  --   --   BASOSABS 0.0  --   --     Chemistries   Recent Labs Lab 11/10/13 2100 11/11/13 0515 11/13/13 0625  NA 145  --  142  K 3.7  --  3.4*  CL 102  --  100  CO2 26  --  30  GLUCOSE 108*  --  86  BUN 20  --  14  CREATININE 0.90 0.77 0.64  CALCIUM 9.5  --  8.6    CBG:  Recent Labs Lab 11/11/13 2040 11/12/13 0750 11/12/13 1108 11/12/13 1631  GLUCAP 112* 109* 91 141*    GFR Estimated Creatinine Clearance: 48.2 ml/min (by C-G formula based on Cr of 0.64).  Coagulation profile No results found for this basename: INR, PROTIME,  in the last 168 hours  Cardiac Enzymes  Recent Labs Lab 11/10/13 2100  TROPONINI <0.30    No components found with this basename: POCBNP,  No results found for this basename: DDIMER,  in the last 72 hours No results found for this basename: HGBA1C,  in the last 72 hours No results found for this basename: CHOL, HDL, LDLCALC, TRIG, CHOLHDL, LDLDIRECT,  in the last 72 hours No results found for this basename: TSH, T4TOTAL, FREET3, T3FREE, THYROIDAB,  in the last 72 hours No results found for this basename: VITAMINB12, FOLATE, FERRITIN, TIBC, IRON, RETICCTPCT,  in the last 72 hours No results found for this basename: LIPASE, AMYLASE,  in the last 72 hours  Urine Studies No results found for this basename: UACOL, UAPR, USPG, UPH, UTP, UGL, UKET, UBIL, UHGB, UNIT, UROB, ULEU, UEPI, UWBC, URBC, UBAC, CAST, CRYS, UCOM, BILUA,  in the last 72 hours  MICROBIOLOGY: No results found for this or any previous visit (from the past 240 hour(s)).  RADIOLOGY STUDIES/RESULTS: Dg Chest 2 View  11/10/2013   CLINICAL DATA:  Shortness of breath, cough.  EXAM: CHEST  2 VIEW  COMPARISON:  None.  FINDINGS: The heart size and mediastinal contours are within normal limits. No pneumothorax or pleural effusion is noted. Small calcified granuloma is noted in right midlung.  Mild central pulmonary vascular congestion is noted with possible bilateral perihilar edema. The visualized skeletal structures are unremarkable.  IMPRESSION: Mild central pulmonary vascular congestion with possible mild bilateral perihilar edema.   Electronically Signed   By: Roque LiasJames  Green M.D.   On: 11/10/2013 21:24   Dg Ankle Complete Left  11/10/2013   CLINICAL DATA:  Traumatic injury with pain  EXAM: LEFT ANKLE COMPLETE - 3+ VIEW  COMPARISON:  None.  FINDINGS: Diffuse soft tissue swelling is identified. There is an oblique fracture through the distal fibula with mild displacement. Additionally a fracture through the medial malleolus is noted.  IMPRESSION: Bimalleolar fracture with generalized soft tissue swelling.   Electronically Signed   By: Eulah PontMark  Lukens M.D.  On: 11/10/2013 20:24   Ct Head Wo Contrast  11/10/2013   CLINICAL DATA:  Post fall, history of dementia  EXAM: CT HEAD WITHOUT CONTRAST  TECHNIQUE: Contiguous axial images were obtained from the base of the skull through the vertex without intravenous contrast.  COMPARISON:  None.  FINDINGS: Advanced atrophy with sulcal prominence and centralized volume loss with mild-to-moderate commensurate ex vacuo dilatation of the ventricular system. Periventricular hypodensities compatible with microvascular ischemic disease. Given background parenchymal abnormalities, there is no CT evidence of acute superimposed large territory infarct. No intraparenchymal or extra-axial mass or hemorrhage. Incidental note is made of a septum cavum pellucidum. Otherwise, normal configuration of the ventricles and basal cisterns. No midline shift. Intracranial atherosclerosis. An air-fluid level is noted within the left sphenoid sinus. The remaining paranasal sinuses and mastoid air cells appear normally aerated. Regional soft tissues appear normal. Post bilateral cataract surgery. No displaced calvarial fracture.  IMPRESSION: Advanced atrophy and microvascular ischemic  disease without acute intracranial process.   Electronically Signed   By: Simonne Come M.D.   On: 11/10/2013 21:32   Dg Chest Port 1 View  11/12/2013   CLINICAL DATA:  Hypoxia, edema, COPD and cough. Status post ORIF of the left ankle.  EXAM: PORTABLE CHEST - 1 VIEW  COMPARISON:  11/10/2013  FINDINGS: Stable small calcified granuloma of the right lung. No edema, infiltrate or pleural effusion is identified. The heart size is stable and within normal limits.  IMPRESSION: No acute findings.   Electronically Signed   By: Irish Lack M.D.   On: 11/12/2013 14:33    Jeoffrey Massed, MD  [facility] Pager:[phone]  If 7PM-7AM, please contact night-coverage [email or web address] Password [email or web address] 04/28/202015, 2:10 PM   LOS: 3 days

## 2013-11-14 LAB — BASIC METABOLIC PANEL
BUN: 14 mg/dL (ref 6–23)
CALCIUM: 8.4 mg/dL (ref 8.4–10.5)
CHLORIDE: 99 meq/L (ref 96–112)
CO2: 30 meq/L (ref 19–32)
CREATININE: 0.59 mg/dL (ref 0.50–1.10)
GFR calc Af Amer: >90 mL/min (ref >90)
GFR calc non Af Amer: 82 mL/min — ABNORMAL LOW (ref >90)
Glucose, Bld: 117 mg/dL — ABNORMAL HIGH (ref 70–99)
Potassium: 3.3 mEq/L — ABNORMAL LOW (ref 3.7–5.3)
SODIUM: 138 meq/L (ref 137–147)

## 2013-11-14 LAB — CBC
HCT: 38.6 % (ref 36.0–46.0)
Hemoglobin: 13 g/dL (ref 12.0–15.0)
MCH: 31.1 pg (ref 26.0–34.0)
MCHC: 33.7 g/dL (ref 30.0–36.0)
MCV: 92.3 fL (ref 78.0–100.0)
Platelets: 240 10*3/uL (ref 150–400)
RBC: 4.18 MIL/uL (ref 3.87–5.11)
RDW: 13 % (ref 11.5–15.5)
WBC: 6 10*3/uL (ref 4.0–10.5)

## 2013-11-14 MED ORDER — LEVALBUTEROL HCL 0.63 MG/3ML IN NEBU: 0.6300 mg | INHALATION_SOLUTION | RESPIRATORY_TRACT | Status: DC | PRN

## 2013-11-14 MED ORDER — ASPIRIN 325 MG PO TABS: 325.0000 mg | ORAL_TABLET | Freq: Every day | ORAL | Status: AC

## 2013-11-14 MED ORDER — METOPROLOL TARTRATE 50 MG PO TABS: 50.0000 mg | ORAL_TABLET | Freq: Two times a day (BID) | ORAL | Status: DC

## 2013-11-14 MED ORDER — DSS 100 MG PO CAPS: 100.0000 mg | ORAL_CAPSULE | Freq: Two times a day (BID) | ORAL | Status: AC

## 2013-11-14 MED ORDER — PAROXETINE HCL 10 MG PO TABS: 10.0000 mg | ORAL_TABLET | Freq: Every day | ORAL | Status: AC

## 2013-11-14 MED ORDER — LEVALBUTEROL HCL 0.63 MG/3ML IN NEBU: 0.6300 mg | INHALATION_SOLUTION | Freq: Three times a day (TID) | RESPIRATORY_TRACT | Status: DC

## 2013-11-14 MED ORDER — ALPRAZOLAM 0.5 MG PO TABS: 0.5000 mg | ORAL_TABLET | Freq: Three times a day (TID) | ORAL | Status: DC | PRN

## 2013-11-14 MED ORDER — METOPROLOL TARTRATE 50 MG PO TABS
50.0000 mg | ORAL_TABLET | Freq: Two times a day (BID) | ORAL | Status: DC
  Administered 2013-11-14: 50 mg via ORAL
  Filled 2013-11-14 (×2): qty 1

## 2013-11-14 MED ORDER — ACETAMINOPHEN 500 MG PO TABS: 1000.0000 mg | ORAL_TABLET | Freq: Three times a day (TID) | ORAL | Status: AC

## 2013-11-14 MED ORDER — ONDANSETRON HCL 4 MG PO TABS: 4.0000 mg | ORAL_TABLET | Freq: Four times a day (QID) | ORAL | Status: DC | PRN

## 2013-11-14 MED ORDER — TRAMADOL HCL 50 MG PO TABS: 50.0000 mg | ORAL_TABLET | Freq: Four times a day (QID) | ORAL | Status: DC | PRN

## 2013-11-14 NOTE — Care Management Note (Signed)
    Page 1 of 1   11/14/2013     9:51:24 AM   CARE MANAGEMENT NOTE 11/14/2013  Patient:  Brooke Phelps,[name]   Account Number:  [id number]  Date Initiated:  11/14/2013  Documentation initiated by:  GRAVES-BIGELOW,Romy Ipock  Subjective/Objective Assessment:   Pt admitted for  Left ankle bimalleolar fracture. S/p open reduction and internal fixation of left ankle fracture. Plan for SNF 11-14-13.     Action/Plan:   CSW to assist with disposition needs.   Anticipated DC Date:  11/14/2013   Anticipated DC Plan:  SKILLED NURSING FACILITY  In-house referral  Clinical Social Worker      DC Planning Services  CM consult      Choice offered to / List presented to:             Status of service:  Completed, signed off Medicare Important Message given?   (If response is "NO", the following Medicare IM given date fields will be blank) Date Medicare IM given:   Date Additional Medicare IM given:    Discharge Disposition:  SKILLED NURSING FACILITY  Per UR Regulation:  Reviewed for med. necessity/level of care/duration of stay  If discussed at Long Length of Stay Meetings, dates discussed:    Comments:

## 2013-11-14 NOTE — Progress Notes (Signed)
Patient unable to void after getting up to [facility].  [facility].  Bladder scan shows 323 ml.  Maren ReamerKaren Kirby notified.  Order received for I&O cath.  250 ml clear amber urine obtained via I&O cath.  Patient reports relief from full bladder.  Will continue to monitor.  Alonza Bogusuvall, Sabeen Piechocki Gray

## 2013-11-14 NOTE — Progress Notes (Signed)
Clinical social worker assisted with patient discharge to skilled nursing facility, [facility].  CSW addressed all family questions and concerns. CSW copied chart and added all important documents. CSW also set up patient transportation with [facility]. Clinical Social Worker will sign off for now as social work intervention is no longer needed.   Faigy Stretch, MSW, LCSWA [phone] 

## 2013-11-14 NOTE — Discharge Instructions (Signed)
Weightbearing as tolerated with fracture boot in place on left. Wet-to-dry dressing as needed Voiding trial in 2 weeks to see her if Foley catheter can be discontinued, may need urology evaluation.

## 2013-11-14 NOTE — Discharge Summary (Signed)
PATIENT DETAILS Name: Brooke Phelps Age: 80 y.o. Sex: female Date of Birth: [birth year] MRN: [id number]. Admit Date: 11/10/2013 Admitting Physician: Houston Siren, MD PCP:No primary provider on file.  Recommendations for Outpatient Follow-up:  1. Check CBC and chemistries periodically 2. Wet-to-dry dressing as needed 3. Will need a primary care practitioner on discharge from skilled nursing facility  PRIMARY DISCHARGE DIAGNOSIS:  Principal Problem:   Bimalleolar ankle fracture Active Problems:   Hypoxia   Dementia with behavioral disturbance   ILD (interstitial lung disease)   Atrial flutter      PAST MEDICAL HISTORY: Past Medical History  Diagnosis Date  . COPD (chronic obstructive pulmonary disease)   . Dementia     DISCHARGE MEDICATIONS:   Medication List         acetaminophen 500 MG tablet  Commonly known as:  TYLENOL  Take 2 tablets (1,000 mg total) by mouth 3 (three) times daily.     ALPRAZolam 0.5 MG tablet  Commonly known as:  XANAX  Take 1 tablet (0.5 mg total) by mouth every 8 (eight) hours as needed for anxiety.     aspirin 325 MG tablet  Take 1 tablet (325 mg total) by mouth daily.     DSS 100 MG Caps  Take 100 mg by mouth 2 (two) times daily.     levalbuterol 0.63 MG/3ML nebulizer solution  Commonly known as:  XOPENEX  Take 3 mLs (0.63 mg total) by nebulization 3 (three) times daily.     levalbuterol 0.63 MG/3ML nebulizer solution  Commonly known as:  XOPENEX  Take 3 mLs (0.63 mg total) by nebulization every 4 (four) hours as needed for wheezing or shortness of breath.     metoprolol 50 MG tablet  Commonly known as:  LOPRESSOR  Take 1 tablet (50 mg total) by mouth 2 (two) times daily.     ondansetron 4 MG tablet  Commonly known as:  ZOFRAN  Take 1 tablet (4 mg total) by mouth every 6 (six) hours as needed for nausea.     PARoxetine 10 MG tablet  Commonly known as:  PAXIL  Take 1 tablet (10 mg total) by mouth daily.     traMADol 50 MG tablet   Commonly known as:  ULTRAM  Take 1 tablet (50 mg total) by mouth every 6 (six) hours as needed for moderate pain.        ALLERGIES:  No Known Allergies  BRIEF HPI:  See H&P, Labs, Consult and Test reports for all details in brief, patient is 80 year old female with a history of dementia, progressive decline over the past few months, who felt in [place] at the skilled nursing facility sustaining a left ankle fracture, she was brought with [facility] by her family as they were in the process of moving to [place]. She was then admitted for further evaluation and treatment   CONSULTATIONS:   cardiology  PERTINENT RADIOLOGIC STUDIES: Dg Chest 2 View  11/10/2013   CLINICAL DATA:  Shortness of breath, cough.  EXAM: CHEST  2 VIEW  COMPARISON:  None.  FINDINGS: The heart size and mediastinal contours are within normal limits. No pneumothorax or pleural effusion is noted. Small calcified granuloma is noted in right midlung. Mild central pulmonary vascular congestion is noted with possible bilateral perihilar edema. The visualized skeletal structures are unremarkable.  IMPRESSION: Mild central pulmonary vascular congestion with possible mild bilateral perihilar edema.   Electronically Signed   By: Roque Lias M.D.   On: 11/10/2013 21:24  Dg Ankle Complete Left  11/10/2013   CLINICAL DATA:  Traumatic injury with pain  EXAM: LEFT ANKLE COMPLETE - 3+ VIEW  COMPARISON:  None.  FINDINGS: Diffuse soft tissue swelling is identified. There is an oblique fracture through the distal fibula with mild displacement. Additionally a fracture through the medial malleolus is noted.  IMPRESSION: Bimalleolar fracture with generalized soft tissue swelling.   Electronically Signed   By: Alcide CleverMark  Lukens M.D.   On: 11/10/2013 20:24   Ct Head Wo Contrast  11/10/2013   CLINICAL DATA:  Post fall, history of dementia  EXAM: CT HEAD WITHOUT CONTRAST  TECHNIQUE: Contiguous axial images were obtained from the base of the skull  through the vertex without intravenous contrast.  COMPARISON:  None.  FINDINGS: Advanced atrophy with sulcal prominence and centralized volume loss with mild-to-moderate commensurate ex vacuo dilatation of the ventricular system. Periventricular hypodensities compatible with microvascular ischemic disease. Given background parenchymal abnormalities, there is no CT evidence of acute superimposed large territory infarct. No intraparenchymal or extra-axial mass or hemorrhage. Incidental note is made of a septum cavum pellucidum. Otherwise, normal configuration of the ventricles and basal cisterns. No midline shift. Intracranial atherosclerosis. An air-fluid level is noted within the left sphenoid sinus. The remaining paranasal sinuses and mastoid air cells appear normally aerated. Regional soft tissues appear normal. Post bilateral cataract surgery. No displaced calvarial fracture.  IMPRESSION: Advanced atrophy and microvascular ischemic disease without acute intracranial process.   Electronically Signed   By: Simonne ComeJohn  Watts M.D.   On: 11/10/2013 21:32   Dg Chest Port 1 View  11/12/2013   CLINICAL DATA:  Hypoxia, edema, COPD and cough. Status post ORIF of the left ankle.  EXAM: PORTABLE CHEST - 1 VIEW  COMPARISON:  11/10/2013  FINDINGS: Stable small calcified granuloma of the right lung. No edema, infiltrate or pleural effusion is identified. The heart size is stable and within normal limits.  IMPRESSION: No acute findings.   Electronically Signed   By: Irish LackGlenn  Yamagata M.D.   On: 11/12/2013 14:33     PERTINENT LAB RESULTS: CBC:  Recent Labs  11/13/13 0625 11/14/13 0345  WBC 5.5 6.0  HGB 12.6 13.0  HCT 38.8 38.6  PLT 218 240   CMET CMP     Component Value Date/Time   NA 138 11/14/2013 0345   K 3.3* 11/14/2013 0345   CL 99 11/14/2013 0345   CO2 30 11/14/2013 0345   GLUCOSE 117* 11/14/2013 0345   BUN 14 11/14/2013 0345   CREATININE 0.59 11/14/2013 0345   CALCIUM 8.4 11/14/2013 0345   GFRNONAA 82*  11/14/2013 0345   GFRAA >90 11/14/2013 0345    GFR Estimated Creatinine Clearance: 48.2 ml/min (by C-G formula based on Cr of 0.59). No results found for this basename: LIPASE, AMYLASE,  in the last 72 hours No results found for this basename: CKTOTAL, CKMB, CKMBINDEX, TROPONINI,  in the last 72 hours No components found with this basename: POCBNP,  No results found for this basename: DDIMER,  in the last 72 hours No results found for this basename: HGBA1C,  in the last 72 hours No results found for this basename: CHOL, HDL, LDLCALC, TRIG, CHOLHDL, LDLDIRECT,  in the last 72 hours No results found for this basename: TSH, T4TOTAL, FREET3, T3FREE, THYROIDAB,  in the last 72 hours No results found for this basename: VITAMINB12, FOLATE, FERRITIN, TIBC, IRON, RETICCTPCT,  in the last 72 hours Coags: No results found for this basename: PT, INR,  in the last  72 hours Microbiology: No results found for this or any previous visit (from the past 240 hour(s)).   BRIEF HOSPITAL COURSE:   Principal Problem:   Left Bimalleolar ankle fracture - This is secondary to a mechanical fall - Patient was admitted, seen in consultation by orthopedics-Dr. Lajoyce Corners. Patient was taken to the operating room on 11/11/13, for a open reduction and internal fixation. - Postop course has been largely unremarkable except for development of mild delirium and a brief episode of atrial flutter with rapid ventricular response. - Unfortunately, patient did require Foley catheter during this admission, it was attempted to be removed yesterday, however she has failed a voiding trial. Foley catheter will be reinserted today on the day of discharge, she would need a voiding trial to be done in 2 weeks, she will likely need a urology evaluation at some point as well. We will defer this to be done while at the skilled nursing facility.  Paroxysmal atrial flutter/atrial fibrillation - As noted above, patient did have a brief episode of  atrial flutter this admission.Per family, and they're aware of this issue, she apparently has had numerous episodes in the past as well, previously she was on amiodarone and Cardizem, however she has been noncompliant with his medications. - In any event, she was monitored on telemetry, echocardiogram done on 11/12/13 shows EF around 45-50%. - Even a history of fall, worsening dementia, after discussion with family, we have decided not to anticoagulate, we will just place on aspirin. Family aware of risk of catastrophic embolic phenomenon including CVA, accepting chances on just aspirin.  Acute hypoxic respiratory failure  - Multifactorial-? Suspected COPD, atrial flutter, atelectasis  - Apparently history of COPD in the past for Dr. Hardie Shackleton note, however a nonsmoker  - Started nebulized bronchodilators, incentive spirometry- this will be continued on admission, significantly better, use oxygen as needed while at skilled nursing facility  Dementia with behavioral disturbance  - Currently stable  - Does have episodes of mild delirium, continue with Xanax and Paxil on discharge.   TODAY-DAY OF DISCHARGE:  Subjective:   Brooke Phelps today has no headache,no chest abdominal pain,no new weakness tingling or numbness.  Objective:   Blood pressure 174/59, pulse 61, temperature 98.8 F (37.1 C), temperature source Oral, resp. rate 18, height 5\' 2"  (1.575 m), weight 68.04 kg (150 lb), SpO2 95.00%.  Intake/Output Summary (Last 24 hours) at 11/14/13 1109 Last data filed at 11/14/13 0641  Gross per 24 hour  Intake    240 ml  Output    250 ml  Net    -10 ml   Filed Weights   11/10/13 1950  Weight: 68.04 kg (150 lb)    Exam Awake Alert, , No new F.N deficits, Normal affect [place].AT,PERRAL Supple Neck,No JVD, No cervical lymphadenopathy appriciated.  Symmetrical Chest wall movement, Good air movement bilaterally, CTAB RRR,No Gallops,Rubs or new Murmurs, No Parasternal Heave +ve B.Sounds,  Abd Soft, Non tender, No organomegaly appriciated, No rebound -guarding or rigidity. No Cyanosis, Clubbing or edema, No new Rash or bruise  DISCHARGE CONDITION: Stable  DISPOSITION: SNF  DISCHARGE INSTRUCTIONS:    Activity:  As tolerated with Full fall precautions use walker/cane & assistance as needed  Diet recommendation: Heart Healthy diet       Discharge Orders   Future Orders Complete By Expires   Call MD for:  extreme fatigue  As directed    Call MD for:  redness, tenderness, or signs of infection (pain, swelling, redness, odor or  green/yellow discharge around incision site)  As directed    Diet - low sodium heart healthy  As directed    Increase activity slowly  As directed       Follow-up Information   Follow up with DUDA,MARCUS V, MD In 2 weeks.   Specialty:  Orthopedic Surgery   Contact information:   [address] [place] [address] [place] [address] [phone]       Schedule an appointment as soon as possible for a visit in 2 weeks to follow up. (after discharge from SNF)    Contact information:   PCP of your choice     Total Time spent on discharge equals 45 minutes.  SignedJeoffrey Massed 11/14/2013 11:09 AM

## 2014-12-25 DIAGNOSIS — I4891 Unspecified atrial fibrillation: Secondary | ICD-10-CM | POA: Diagnosis not present

## 2014-12-25 DIAGNOSIS — D649 Anemia, unspecified: Secondary | ICD-10-CM | POA: Diagnosis not present

## 2014-12-25 DIAGNOSIS — Z7982 Long term (current) use of aspirin: Secondary | ICD-10-CM | POA: Diagnosis not present

## 2014-12-25 DIAGNOSIS — F0390 Unspecified dementia without behavioral disturbance: Secondary | ICD-10-CM | POA: Diagnosis not present

## 2014-12-25 DIAGNOSIS — M6281 Muscle weakness (generalized): Secondary | ICD-10-CM | POA: Diagnosis not present

## 2014-12-25 DIAGNOSIS — J449 Chronic obstructive pulmonary disease, unspecified: Secondary | ICD-10-CM | POA: Diagnosis not present

## 2014-12-25 DIAGNOSIS — Z9181 History of falling: Secondary | ICD-10-CM | POA: Diagnosis not present

## 2014-12-25 DIAGNOSIS — R269 Unspecified abnormalities of gait and mobility: Secondary | ICD-10-CM | POA: Diagnosis not present

## 2014-12-31 DIAGNOSIS — M79671 Pain in right foot: Secondary | ICD-10-CM | POA: Diagnosis not present

## 2014-12-31 DIAGNOSIS — B351 Tinea unguium: Secondary | ICD-10-CM | POA: Diagnosis not present

## 2014-12-31 DIAGNOSIS — M79674 Pain in right toe(s): Secondary | ICD-10-CM | POA: Diagnosis not present

## 2015-01-04 DIAGNOSIS — D649 Anemia, unspecified: Secondary | ICD-10-CM | POA: Diagnosis not present

## 2015-01-04 DIAGNOSIS — F0390 Unspecified dementia without behavioral disturbance: Secondary | ICD-10-CM | POA: Diagnosis not present

## 2015-01-04 DIAGNOSIS — M6281 Muscle weakness (generalized): Secondary | ICD-10-CM | POA: Diagnosis not present

## 2015-01-04 DIAGNOSIS — R269 Unspecified abnormalities of gait and mobility: Secondary | ICD-10-CM | POA: Diagnosis not present

## 2015-01-04 DIAGNOSIS — J449 Chronic obstructive pulmonary disease, unspecified: Secondary | ICD-10-CM | POA: Diagnosis not present

## 2015-01-04 DIAGNOSIS — I4891 Unspecified atrial fibrillation: Secondary | ICD-10-CM | POA: Diagnosis not present

## 2015-01-05 DIAGNOSIS — I4891 Unspecified atrial fibrillation: Secondary | ICD-10-CM | POA: Diagnosis not present

## 2015-01-05 DIAGNOSIS — F0390 Unspecified dementia without behavioral disturbance: Secondary | ICD-10-CM | POA: Diagnosis not present

## 2015-01-05 DIAGNOSIS — J449 Chronic obstructive pulmonary disease, unspecified: Secondary | ICD-10-CM | POA: Diagnosis not present

## 2015-01-05 DIAGNOSIS — M6281 Muscle weakness (generalized): Secondary | ICD-10-CM | POA: Diagnosis not present

## 2015-01-05 DIAGNOSIS — D649 Anemia, unspecified: Secondary | ICD-10-CM | POA: Diagnosis not present

## 2015-01-05 DIAGNOSIS — R269 Unspecified abnormalities of gait and mobility: Secondary | ICD-10-CM | POA: Diagnosis not present

## 2015-01-07 DIAGNOSIS — D649 Anemia, unspecified: Secondary | ICD-10-CM | POA: Diagnosis not present

## 2015-01-07 DIAGNOSIS — F0390 Unspecified dementia without behavioral disturbance: Secondary | ICD-10-CM | POA: Diagnosis not present

## 2015-01-07 DIAGNOSIS — I4891 Unspecified atrial fibrillation: Secondary | ICD-10-CM | POA: Diagnosis not present

## 2015-01-07 DIAGNOSIS — J449 Chronic obstructive pulmonary disease, unspecified: Secondary | ICD-10-CM | POA: Diagnosis not present

## 2015-01-07 DIAGNOSIS — R269 Unspecified abnormalities of gait and mobility: Secondary | ICD-10-CM | POA: Diagnosis not present

## 2015-01-07 DIAGNOSIS — M6281 Muscle weakness (generalized): Secondary | ICD-10-CM | POA: Diagnosis not present

## 2015-01-08 DIAGNOSIS — J449 Chronic obstructive pulmonary disease, unspecified: Secondary | ICD-10-CM | POA: Diagnosis not present

## 2015-01-08 DIAGNOSIS — I4891 Unspecified atrial fibrillation: Secondary | ICD-10-CM | POA: Diagnosis not present

## 2015-01-08 DIAGNOSIS — R269 Unspecified abnormalities of gait and mobility: Secondary | ICD-10-CM | POA: Diagnosis not present

## 2015-01-08 DIAGNOSIS — D649 Anemia, unspecified: Secondary | ICD-10-CM | POA: Diagnosis not present

## 2015-01-08 DIAGNOSIS — M6281 Muscle weakness (generalized): Secondary | ICD-10-CM | POA: Diagnosis not present

## 2015-01-08 DIAGNOSIS — F0390 Unspecified dementia without behavioral disturbance: Secondary | ICD-10-CM | POA: Diagnosis not present

## 2015-01-09 DIAGNOSIS — F0390 Unspecified dementia without behavioral disturbance: Secondary | ICD-10-CM | POA: Diagnosis not present

## 2015-01-09 DIAGNOSIS — D649 Anemia, unspecified: Secondary | ICD-10-CM | POA: Diagnosis not present

## 2015-01-09 DIAGNOSIS — J449 Chronic obstructive pulmonary disease, unspecified: Secondary | ICD-10-CM | POA: Diagnosis not present

## 2015-01-09 DIAGNOSIS — R269 Unspecified abnormalities of gait and mobility: Secondary | ICD-10-CM | POA: Diagnosis not present

## 2015-01-09 DIAGNOSIS — I4891 Unspecified atrial fibrillation: Secondary | ICD-10-CM | POA: Diagnosis not present

## 2015-01-09 DIAGNOSIS — M6281 Muscle weakness (generalized): Secondary | ICD-10-CM | POA: Diagnosis not present

## 2015-01-12 DIAGNOSIS — F0390 Unspecified dementia without behavioral disturbance: Secondary | ICD-10-CM | POA: Diagnosis not present

## 2015-01-12 DIAGNOSIS — I4891 Unspecified atrial fibrillation: Secondary | ICD-10-CM | POA: Diagnosis not present

## 2015-01-12 DIAGNOSIS — J449 Chronic obstructive pulmonary disease, unspecified: Secondary | ICD-10-CM | POA: Diagnosis not present

## 2015-01-12 DIAGNOSIS — M6281 Muscle weakness (generalized): Secondary | ICD-10-CM | POA: Diagnosis not present

## 2015-01-12 DIAGNOSIS — D649 Anemia, unspecified: Secondary | ICD-10-CM | POA: Diagnosis not present

## 2015-01-12 DIAGNOSIS — R269 Unspecified abnormalities of gait and mobility: Secondary | ICD-10-CM | POA: Diagnosis not present

## 2015-01-14 DIAGNOSIS — J449 Chronic obstructive pulmonary disease, unspecified: Secondary | ICD-10-CM | POA: Diagnosis not present

## 2015-01-14 DIAGNOSIS — M6281 Muscle weakness (generalized): Secondary | ICD-10-CM | POA: Diagnosis not present

## 2015-01-14 DIAGNOSIS — I4891 Unspecified atrial fibrillation: Secondary | ICD-10-CM | POA: Diagnosis not present

## 2015-01-14 DIAGNOSIS — F0390 Unspecified dementia without behavioral disturbance: Secondary | ICD-10-CM | POA: Diagnosis not present

## 2015-01-14 DIAGNOSIS — R269 Unspecified abnormalities of gait and mobility: Secondary | ICD-10-CM | POA: Diagnosis not present

## 2015-01-14 DIAGNOSIS — D649 Anemia, unspecified: Secondary | ICD-10-CM | POA: Diagnosis not present

## 2015-01-15 DIAGNOSIS — M6281 Muscle weakness (generalized): Secondary | ICD-10-CM | POA: Diagnosis not present

## 2015-01-15 DIAGNOSIS — R269 Unspecified abnormalities of gait and mobility: Secondary | ICD-10-CM | POA: Diagnosis not present

## 2015-01-15 DIAGNOSIS — I4891 Unspecified atrial fibrillation: Secondary | ICD-10-CM | POA: Diagnosis not present

## 2015-01-15 DIAGNOSIS — F0390 Unspecified dementia without behavioral disturbance: Secondary | ICD-10-CM | POA: Diagnosis not present

## 2015-01-15 DIAGNOSIS — D649 Anemia, unspecified: Secondary | ICD-10-CM | POA: Diagnosis not present

## 2015-01-15 DIAGNOSIS — J449 Chronic obstructive pulmonary disease, unspecified: Secondary | ICD-10-CM | POA: Diagnosis not present

## 2015-01-16 DIAGNOSIS — D649 Anemia, unspecified: Secondary | ICD-10-CM | POA: Diagnosis not present

## 2015-01-16 DIAGNOSIS — I4891 Unspecified atrial fibrillation: Secondary | ICD-10-CM | POA: Diagnosis not present

## 2015-01-16 DIAGNOSIS — M6281 Muscle weakness (generalized): Secondary | ICD-10-CM | POA: Diagnosis not present

## 2015-01-16 DIAGNOSIS — J449 Chronic obstructive pulmonary disease, unspecified: Secondary | ICD-10-CM | POA: Diagnosis not present

## 2015-01-16 DIAGNOSIS — R269 Unspecified abnormalities of gait and mobility: Secondary | ICD-10-CM | POA: Diagnosis not present

## 2015-01-16 DIAGNOSIS — F0390 Unspecified dementia without behavioral disturbance: Secondary | ICD-10-CM | POA: Diagnosis not present

## 2015-01-17 DIAGNOSIS — R269 Unspecified abnormalities of gait and mobility: Secondary | ICD-10-CM | POA: Diagnosis not present

## 2015-01-17 DIAGNOSIS — J449 Chronic obstructive pulmonary disease, unspecified: Secondary | ICD-10-CM | POA: Diagnosis not present

## 2015-01-17 DIAGNOSIS — F0390 Unspecified dementia without behavioral disturbance: Secondary | ICD-10-CM | POA: Diagnosis not present

## 2015-01-17 DIAGNOSIS — D649 Anemia, unspecified: Secondary | ICD-10-CM | POA: Diagnosis not present

## 2015-01-17 DIAGNOSIS — M6281 Muscle weakness (generalized): Secondary | ICD-10-CM | POA: Diagnosis not present

## 2015-01-17 DIAGNOSIS — I4891 Unspecified atrial fibrillation: Secondary | ICD-10-CM | POA: Diagnosis not present

## 2015-01-18 DIAGNOSIS — F0390 Unspecified dementia without behavioral disturbance: Secondary | ICD-10-CM | POA: Diagnosis not present

## 2015-01-18 DIAGNOSIS — M6281 Muscle weakness (generalized): Secondary | ICD-10-CM | POA: Diagnosis not present

## 2015-01-18 DIAGNOSIS — R269 Unspecified abnormalities of gait and mobility: Secondary | ICD-10-CM | POA: Diagnosis not present

## 2015-01-18 DIAGNOSIS — J449 Chronic obstructive pulmonary disease, unspecified: Secondary | ICD-10-CM | POA: Diagnosis not present

## 2015-01-18 DIAGNOSIS — I4891 Unspecified atrial fibrillation: Secondary | ICD-10-CM | POA: Diagnosis not present

## 2015-01-18 DIAGNOSIS — D649 Anemia, unspecified: Secondary | ICD-10-CM | POA: Diagnosis not present

## 2015-01-21 DIAGNOSIS — M6281 Muscle weakness (generalized): Secondary | ICD-10-CM | POA: Diagnosis not present

## 2015-01-21 DIAGNOSIS — D649 Anemia, unspecified: Secondary | ICD-10-CM | POA: Diagnosis not present

## 2015-01-21 DIAGNOSIS — R269 Unspecified abnormalities of gait and mobility: Secondary | ICD-10-CM | POA: Diagnosis not present

## 2015-01-21 DIAGNOSIS — I4891 Unspecified atrial fibrillation: Secondary | ICD-10-CM | POA: Diagnosis not present

## 2015-01-21 DIAGNOSIS — F0390 Unspecified dementia without behavioral disturbance: Secondary | ICD-10-CM | POA: Diagnosis not present

## 2015-01-21 DIAGNOSIS — J449 Chronic obstructive pulmonary disease, unspecified: Secondary | ICD-10-CM | POA: Diagnosis not present

## 2015-01-23 DIAGNOSIS — J449 Chronic obstructive pulmonary disease, unspecified: Secondary | ICD-10-CM | POA: Diagnosis not present

## 2015-01-23 DIAGNOSIS — D649 Anemia, unspecified: Secondary | ICD-10-CM | POA: Diagnosis not present

## 2015-01-23 DIAGNOSIS — M6281 Muscle weakness (generalized): Secondary | ICD-10-CM | POA: Diagnosis not present

## 2015-01-23 DIAGNOSIS — F0390 Unspecified dementia without behavioral disturbance: Secondary | ICD-10-CM | POA: Diagnosis not present

## 2015-01-23 DIAGNOSIS — R269 Unspecified abnormalities of gait and mobility: Secondary | ICD-10-CM | POA: Diagnosis not present

## 2015-01-23 DIAGNOSIS — I4891 Unspecified atrial fibrillation: Secondary | ICD-10-CM | POA: Diagnosis not present

## 2015-01-26 DIAGNOSIS — M6281 Muscle weakness (generalized): Secondary | ICD-10-CM | POA: Diagnosis not present

## 2015-01-26 DIAGNOSIS — F0390 Unspecified dementia without behavioral disturbance: Secondary | ICD-10-CM | POA: Diagnosis not present

## 2015-01-26 DIAGNOSIS — R269 Unspecified abnormalities of gait and mobility: Secondary | ICD-10-CM | POA: Diagnosis not present

## 2015-01-26 DIAGNOSIS — I4891 Unspecified atrial fibrillation: Secondary | ICD-10-CM | POA: Diagnosis not present

## 2015-01-26 DIAGNOSIS — D649 Anemia, unspecified: Secondary | ICD-10-CM | POA: Diagnosis not present

## 2015-01-26 DIAGNOSIS — J449 Chronic obstructive pulmonary disease, unspecified: Secondary | ICD-10-CM | POA: Diagnosis not present

## 2015-01-28 DIAGNOSIS — J449 Chronic obstructive pulmonary disease, unspecified: Secondary | ICD-10-CM | POA: Diagnosis not present

## 2015-01-28 DIAGNOSIS — R269 Unspecified abnormalities of gait and mobility: Secondary | ICD-10-CM | POA: Diagnosis not present

## 2015-01-28 DIAGNOSIS — M6281 Muscle weakness (generalized): Secondary | ICD-10-CM | POA: Diagnosis not present

## 2015-01-28 DIAGNOSIS — F0390 Unspecified dementia without behavioral disturbance: Secondary | ICD-10-CM | POA: Diagnosis not present

## 2015-01-28 DIAGNOSIS — D649 Anemia, unspecified: Secondary | ICD-10-CM | POA: Diagnosis not present

## 2015-01-28 DIAGNOSIS — I4891 Unspecified atrial fibrillation: Secondary | ICD-10-CM | POA: Diagnosis not present

## 2015-01-30 DIAGNOSIS — R739 Hyperglycemia, unspecified: Secondary | ICD-10-CM | POA: Diagnosis not present

## 2015-01-30 DIAGNOSIS — M6281 Muscle weakness (generalized): Secondary | ICD-10-CM | POA: Diagnosis not present

## 2015-01-30 DIAGNOSIS — L821 Other seborrheic keratosis: Secondary | ICD-10-CM | POA: Diagnosis not present

## 2015-01-30 DIAGNOSIS — I4891 Unspecified atrial fibrillation: Secondary | ICD-10-CM | POA: Diagnosis not present

## 2015-01-30 DIAGNOSIS — I10 Essential (primary) hypertension: Secondary | ICD-10-CM | POA: Diagnosis not present

## 2015-01-30 DIAGNOSIS — D649 Anemia, unspecified: Secondary | ICD-10-CM | POA: Diagnosis not present

## 2015-01-30 DIAGNOSIS — F0390 Unspecified dementia without behavioral disturbance: Secondary | ICD-10-CM | POA: Diagnosis not present

## 2015-01-30 DIAGNOSIS — R54 Age-related physical debility: Secondary | ICD-10-CM | POA: Diagnosis not present

## 2015-01-30 DIAGNOSIS — R269 Unspecified abnormalities of gait and mobility: Secondary | ICD-10-CM | POA: Diagnosis not present

## 2015-01-30 DIAGNOSIS — R5383 Other fatigue: Secondary | ICD-10-CM | POA: Diagnosis not present

## 2015-01-30 DIAGNOSIS — E785 Hyperlipidemia, unspecified: Secondary | ICD-10-CM | POA: Diagnosis not present

## 2015-01-30 DIAGNOSIS — R5381 Other malaise: Secondary | ICD-10-CM | POA: Diagnosis not present

## 2015-01-30 DIAGNOSIS — J449 Chronic obstructive pulmonary disease, unspecified: Secondary | ICD-10-CM | POA: Diagnosis not present

## 2015-02-02 DIAGNOSIS — J449 Chronic obstructive pulmonary disease, unspecified: Secondary | ICD-10-CM | POA: Diagnosis not present

## 2015-02-02 DIAGNOSIS — R269 Unspecified abnormalities of gait and mobility: Secondary | ICD-10-CM | POA: Diagnosis not present

## 2015-02-02 DIAGNOSIS — F0390 Unspecified dementia without behavioral disturbance: Secondary | ICD-10-CM | POA: Diagnosis not present

## 2015-02-02 DIAGNOSIS — M6281 Muscle weakness (generalized): Secondary | ICD-10-CM | POA: Diagnosis not present

## 2015-02-02 DIAGNOSIS — D649 Anemia, unspecified: Secondary | ICD-10-CM | POA: Diagnosis not present

## 2015-02-02 DIAGNOSIS — I4891 Unspecified atrial fibrillation: Secondary | ICD-10-CM | POA: Diagnosis not present

## 2015-02-04 DIAGNOSIS — J449 Chronic obstructive pulmonary disease, unspecified: Secondary | ICD-10-CM | POA: Diagnosis not present

## 2015-02-04 DIAGNOSIS — M6281 Muscle weakness (generalized): Secondary | ICD-10-CM | POA: Diagnosis not present

## 2015-02-04 DIAGNOSIS — R269 Unspecified abnormalities of gait and mobility: Secondary | ICD-10-CM | POA: Diagnosis not present

## 2015-02-04 DIAGNOSIS — I4891 Unspecified atrial fibrillation: Secondary | ICD-10-CM | POA: Diagnosis not present

## 2015-02-04 DIAGNOSIS — F0390 Unspecified dementia without behavioral disturbance: Secondary | ICD-10-CM | POA: Diagnosis not present

## 2015-02-04 DIAGNOSIS — D649 Anemia, unspecified: Secondary | ICD-10-CM | POA: Diagnosis not present

## 2015-02-05 DIAGNOSIS — M6281 Muscle weakness (generalized): Secondary | ICD-10-CM | POA: Diagnosis not present

## 2015-02-05 DIAGNOSIS — F0390 Unspecified dementia without behavioral disturbance: Secondary | ICD-10-CM | POA: Diagnosis not present

## 2015-02-05 DIAGNOSIS — I4891 Unspecified atrial fibrillation: Secondary | ICD-10-CM | POA: Diagnosis not present

## 2015-02-05 DIAGNOSIS — D649 Anemia, unspecified: Secondary | ICD-10-CM | POA: Diagnosis not present

## 2015-02-05 DIAGNOSIS — J449 Chronic obstructive pulmonary disease, unspecified: Secondary | ICD-10-CM | POA: Diagnosis not present

## 2015-02-05 DIAGNOSIS — R269 Unspecified abnormalities of gait and mobility: Secondary | ICD-10-CM | POA: Diagnosis not present

## 2015-02-07 DIAGNOSIS — I4891 Unspecified atrial fibrillation: Secondary | ICD-10-CM | POA: Diagnosis not present

## 2015-02-07 DIAGNOSIS — M6281 Muscle weakness (generalized): Secondary | ICD-10-CM | POA: Diagnosis not present

## 2015-02-07 DIAGNOSIS — D649 Anemia, unspecified: Secondary | ICD-10-CM | POA: Diagnosis not present

## 2015-02-07 DIAGNOSIS — R269 Unspecified abnormalities of gait and mobility: Secondary | ICD-10-CM | POA: Diagnosis not present

## 2015-02-07 DIAGNOSIS — J449 Chronic obstructive pulmonary disease, unspecified: Secondary | ICD-10-CM | POA: Diagnosis not present

## 2015-02-07 DIAGNOSIS — F0390 Unspecified dementia without behavioral disturbance: Secondary | ICD-10-CM | POA: Diagnosis not present

## 2015-02-12 DIAGNOSIS — J449 Chronic obstructive pulmonary disease, unspecified: Secondary | ICD-10-CM | POA: Diagnosis not present

## 2015-02-12 DIAGNOSIS — R269 Unspecified abnormalities of gait and mobility: Secondary | ICD-10-CM | POA: Diagnosis not present

## 2015-02-12 DIAGNOSIS — D649 Anemia, unspecified: Secondary | ICD-10-CM | POA: Diagnosis not present

## 2015-02-12 DIAGNOSIS — I4891 Unspecified atrial fibrillation: Secondary | ICD-10-CM | POA: Diagnosis not present

## 2015-02-12 DIAGNOSIS — M6281 Muscle weakness (generalized): Secondary | ICD-10-CM | POA: Diagnosis not present

## 2015-02-12 DIAGNOSIS — F0390 Unspecified dementia without behavioral disturbance: Secondary | ICD-10-CM | POA: Diagnosis not present

## 2015-02-14 DIAGNOSIS — M6281 Muscle weakness (generalized): Secondary | ICD-10-CM | POA: Diagnosis not present

## 2015-02-14 DIAGNOSIS — R269 Unspecified abnormalities of gait and mobility: Secondary | ICD-10-CM | POA: Diagnosis not present

## 2015-02-14 DIAGNOSIS — J449 Chronic obstructive pulmonary disease, unspecified: Secondary | ICD-10-CM | POA: Diagnosis not present

## 2015-02-14 DIAGNOSIS — F0390 Unspecified dementia without behavioral disturbance: Secondary | ICD-10-CM | POA: Diagnosis not present

## 2015-02-14 DIAGNOSIS — I4891 Unspecified atrial fibrillation: Secondary | ICD-10-CM | POA: Diagnosis not present

## 2015-02-14 DIAGNOSIS — D649 Anemia, unspecified: Secondary | ICD-10-CM | POA: Diagnosis not present

## 2015-02-18 DIAGNOSIS — F0390 Unspecified dementia without behavioral disturbance: Secondary | ICD-10-CM | POA: Diagnosis not present

## 2015-02-18 DIAGNOSIS — I4891 Unspecified atrial fibrillation: Secondary | ICD-10-CM | POA: Diagnosis not present

## 2015-02-18 DIAGNOSIS — M6281 Muscle weakness (generalized): Secondary | ICD-10-CM | POA: Diagnosis not present

## 2015-02-18 DIAGNOSIS — R269 Unspecified abnormalities of gait and mobility: Secondary | ICD-10-CM | POA: Diagnosis not present

## 2015-02-18 DIAGNOSIS — J449 Chronic obstructive pulmonary disease, unspecified: Secondary | ICD-10-CM | POA: Diagnosis not present

## 2015-02-18 DIAGNOSIS — D649 Anemia, unspecified: Secondary | ICD-10-CM | POA: Diagnosis not present

## 2015-02-28 ENCOUNTER — Emergency Department (HOSPITAL_COMMUNITY): Payer: Medicare Other

## 2015-02-28 ENCOUNTER — Emergency Department (HOSPITAL_COMMUNITY)
Admission: EM | Admit: 2015-02-28 | Discharge: 2015-02-28 | Disposition: A | Discharge status: Home / Self Care | Payer: Medicare Other | Attending: Emergency Medicine | Admitting: Emergency Medicine

## 2015-02-28 ENCOUNTER — Encounter (HOSPITAL_COMMUNITY): Payer: Self-pay | Admitting: *Deleted

## 2015-02-28 DIAGNOSIS — M25521 Pain in right elbow: Secondary | ICD-10-CM | POA: Diagnosis not present

## 2015-02-28 DIAGNOSIS — F0390 Unspecified dementia without behavioral disturbance: Secondary | ICD-10-CM | POA: Insufficient documentation

## 2015-02-28 DIAGNOSIS — R9431 Abnormal electrocardiogram [ECG] [EKG]: Secondary | ICD-10-CM | POA: Diagnosis not present

## 2015-02-28 DIAGNOSIS — Y9389 Activity, other specified: Secondary | ICD-10-CM | POA: Diagnosis not present

## 2015-02-28 DIAGNOSIS — I4891 Unspecified atrial fibrillation: Secondary | ICD-10-CM | POA: Insufficient documentation

## 2015-02-28 DIAGNOSIS — R52 Pain, unspecified: Secondary | ICD-10-CM | POA: Diagnosis not present

## 2015-02-28 DIAGNOSIS — W1839XA Other fall on same level, initial encounter: Secondary | ICD-10-CM | POA: Insufficient documentation

## 2015-02-28 DIAGNOSIS — J449 Chronic obstructive pulmonary disease, unspecified: Secondary | ICD-10-CM | POA: Insufficient documentation

## 2015-02-28 DIAGNOSIS — Y998 Other external cause status: Secondary | ICD-10-CM | POA: Diagnosis not present

## 2015-02-28 DIAGNOSIS — S5001XA Contusion of right elbow, initial encounter: Secondary | ICD-10-CM | POA: Diagnosis not present

## 2015-02-28 DIAGNOSIS — Y92128 Other place in nursing home as the place of occurrence of the external cause: Secondary | ICD-10-CM | POA: Insufficient documentation

## 2015-02-28 DIAGNOSIS — W19XXXA Unspecified fall, initial encounter: Secondary | ICD-10-CM

## 2015-02-28 DIAGNOSIS — S59901A Unspecified injury of right elbow, initial encounter: Secondary | ICD-10-CM | POA: Diagnosis not present

## 2015-02-28 DIAGNOSIS — Z79899 Other long term (current) drug therapy: Secondary | ICD-10-CM | POA: Insufficient documentation

## 2015-02-28 DIAGNOSIS — Z7982 Long term (current) use of aspirin: Secondary | ICD-10-CM | POA: Diagnosis not present

## 2015-02-28 DIAGNOSIS — J8410 Pulmonary fibrosis, unspecified: Secondary | ICD-10-CM | POA: Diagnosis not present

## 2015-02-28 DIAGNOSIS — S0990XA Unspecified injury of head, initial encounter: Secondary | ICD-10-CM | POA: Diagnosis not present

## 2015-02-28 DIAGNOSIS — T148 Other injury of unspecified body region: Secondary | ICD-10-CM | POA: Diagnosis not present

## 2015-02-28 DIAGNOSIS — S199XXA Unspecified injury of neck, initial encounter: Secondary | ICD-10-CM | POA: Diagnosis not present

## 2015-02-28 HISTORY — DX: Unspecified atrial fibrillation: I48.91

## 2015-02-28 MED ORDER — ALBUTEROL SULFATE (2.5 MG/3ML) 0.083% IN NEBU
5.0000 mg | INHALATION_SOLUTION | Freq: Once | RESPIRATORY_TRACT | Status: AC
  Administered 2015-02-28: 5 mg via RESPIRATORY_TRACT
  Filled 2015-02-28: qty 6

## 2015-02-28 NOTE — ED Notes (Signed)
Patient back in the room and placed on the monitor.

## 2015-02-28 NOTE — ED Notes (Signed)
Soft collar applied

## 2015-02-28 NOTE — Discharge Instructions (Signed)
Ice and elevate right elbow. Tylenol for pain. Follow up with primary care doctor. Pt did drop her oxygen saturation into 80s while in ED. She received a breathing treatment. Chest xray normal. Please follow up with primary care doctor for recheck in 1-2 days.    Contusion A contusion is a deep bruise. Contusions are the result of an injury that caused bleeding under the skin. The contusion may turn blue, purple, or yellow. Minor injuries will give you a painless contusion, but more severe contusions may stay painful and swollen for a few weeks.  CAUSES  A contusion is usually caused by a blow, trauma, or direct force to an area of the body. SYMPTOMS   Swelling and redness of the injured area.  Bruising of the injured area.  Tenderness and soreness of the injured area.  Pain. DIAGNOSIS  The diagnosis can be made by taking a history and physical exam. An X-ray, CT scan, or MRI may be needed to determine if there were any associated injuries, such as fractures. TREATMENT  Specific treatment will depend on what area of the body was injured. In general, the best treatment for a contusion is resting, icing, elevating, and applying cold compresses to the injured area. Over-the-counter medicines may also be recommended for pain control. Ask your caregiver what the best treatment is for your contusion. HOME CARE INSTRUCTIONS   Put ice on the injured area.  Put ice in a plastic bag.  Place a towel between your skin and the bag.  Leave the ice on for 15-20 minutes, 3-4 times a day, or as directed by your health care provider.  Only take over-the-counter or prescription medicines for pain, discomfort, or fever as directed by your caregiver. Your caregiver may recommend avoiding anti-inflammatory medicines (aspirin, ibuprofen, and naproxen) for 48 hours because these medicines may increase bruising.  Rest the injured area.  If possible, elevate the injured area to reduce swelling. SEEK  IMMEDIATE MEDICAL CARE IF:   You have increased bruising or swelling.  You have pain that is getting worse.  Your swelling or pain is not relieved with medicines. MAKE SURE YOU:   Understand these instructions.  Will watch your condition.  Will get help right away if you are not doing well or get worse. Document Released: 07/29/2005 Document Revised: 10/24/2013 Document Reviewed: 08/24/2011 [facility] Patient Information 2015 [employer], [place]. This information is not intended to replace advice given to you by your health care provider. Make sure you discuss any questions you have with your health care provider. Fall Prevention and Home Safety Falls cause injuries and can affect all age groups. It is possible to use preventive measures to significantly decrease the likelihood of falls. There are many simple measures which can make your home safer and prevent falls. OUTDOORS  Repair cracks and edges of walkways and driveways.  Remove high doorway thresholds.  Trim shrubbery on the main path into your home.  Have good outside lighting.  Clear walkways of tools, rocks, debris, and clutter.  Check that handrails are not broken and are securely fastened. Both sides of steps should have handrails.  Have leaves, snow, and ice cleared regularly.  Use sand or salt on walkways during winter months.  In the garage, clean up grease or oil spills. BATHROOM  Install night lights.  Install grab bars by the toilet and in the tub and shower.  Use non-skid mats or decals in the tub or shower.  Place a plastic non-slip stool in the  shower to sit on, if needed.  Keep floors dry and clean up all water on the floor immediately.  Remove soap buildup in the tub or shower on a regular basis.  Secure bath mats with non-slip, double-sided rug tape.  Remove throw rugs and tripping hazards from the floors. BEDROOMS  Install night lights.  Make sure a bedside light is easy to reach.  Do not  use oversized bedding.  Keep a telephone by your bedside.  Have a firm chair with side arms to use for getting dressed.  Remove throw rugs and tripping hazards from the floor. KITCHEN  Keep handles on pots and pans turned toward the center of the stove. Use back burners when possible.  Clean up spills quickly and allow time for drying.  Avoid walking on wet floors.  Avoid hot utensils and knives.  Position shelves so they are not too high or low.  Place commonly used objects within easy reach.  If necessary, use a sturdy step stool with a grab bar when reaching.  Keep electrical cables out of the way.  Do not use floor polish or wax that makes floors slippery. If you must use wax, use non-skid floor wax.  Remove throw rugs and tripping hazards from the floor. STAIRWAYS  Never leave objects on stairs.  Place handrails on both sides of stairways and use them. Fix any loose handrails. Make sure handrails on both sides of the stairways are as long as the stairs.  Check carpeting to make sure it is firmly attached along stairs. Make repairs to worn or loose carpet promptly.  Avoid placing throw rugs at the top or bottom of stairways, or properly secure the rug with carpet tape to prevent slippage. Get rid of throw rugs, if possible.  Have an electrician put in a light switch at the top and bottom of the stairs. OTHER FALL PREVENTION TIPS  Wear low-heel or rubber-soled shoes that are supportive and fit well. Wear closed toe shoes.  When using a stepladder, make sure it is fully opened and both spreaders are firmly locked. Do not climb a closed stepladder.  Add color or contrast paint or tape to grab bars and handrails in your home. Place contrasting color strips on first and last steps.  Learn and use mobility aids as needed. Install an electrical emergency response system.  Turn on lights to avoid dark areas. Replace light bulbs that burn out immediately. Get light  switches that glow.  Arrange furniture to create clear pathways. Keep furniture in the same place.  Firmly attach carpet with non-skid or double-sided tape.  Eliminate uneven floor surfaces.  Select a carpet pattern that does not visually hide the edge of steps.  Be aware of all pets. OTHER HOME SAFETY TIPS  Set the water temperature for 120 F (48.8 C).  Keep emergency numbers on or near the telephone.  Keep smoke detectors on every level of the home and near sleeping areas. Document Released: 10/09/2002 Document Revised: 04/19/2012 Document Reviewed: 01/08/2012 [facility] Patient Information 2015 [place], [place]. This information is not intended to replace advice given to you by your health care provider. Make sure you discuss any questions you have with your health care provider.

## 2015-02-28 NOTE — ED Notes (Signed)
[facility] Pt found sitting up against a wall. Hx of hypoxia, initial O2 sats 86%. Placed on 2L, sats returned to normal. No obvious deformities noted. Pt placed in C-Collar due to hx of dementia. Pt has no complaints. CBG 115.

## 2015-02-28 NOTE — ED Provider Notes (Signed)
CSN: [id number]     Arrival date & time 02/28/15  4098 History   First MD Initiated Contact with Patient 02/28/15 [phone]     Chief Complaint  Patient presents with  . Fall     (Consider location/radiation/quality/duration/timing/severity/associated sxs/prior Treatment) HPI Brooke Phelps is a 81 y.o. female with hx of COPD, afib, dementia, presents to ED after a fall. Pt states she fell in her room. Complaining of right elbow pain and bruising. Spoke with pt's nurse at [facility], pt was apparently found on the floor by a housekeeping staff. Pt told them that she was getting out of bed when she fell. Pt does have slightly unsteady gait. Supposed to use a walker but per staff she is refusing. Pt denies any headache at this time, however per staff pt was holding her head with incident happened. Pt denies any chest pain or shortness of breath. No abdominal pain. No back pain. Pt was able to get up and ambulate after her fall.   Past Medical History  Diagnosis Date  . COPD (chronic obstructive pulmonary disease)   . Dementia   . Atrial fibrillation    Past Surgical History  Procedure Laterality Date  . Orif ankle fracture Left 11/11/2013    Procedure: OPEN REDUCTION INTERNAL FIXATION (ORIF) ANKLE FRACTURE;  Surgeon: Nadara Mustard, MD;  Location: [facility] OR;  Service: Orthopedics;  Laterality: Left;   No family history on file. History  Substance Use Topics  . Smoking status: Never Smoker   . Smokeless tobacco: Not on file  . Alcohol Use: No   OB History    No data available     Review of Systems  Unable to perform ROS: Dementia  All other systems reviewed and are negative.     Allergies  Review of patient's allergies indicates no known allergies.  Home Medications   Prior to Admission medications   Medication Sig Start Date End Date Taking? Authorizing Provider  acetaminophen (TYLENOL) 500 MG tablet Take 2 tablets (1,000 mg total) by mouth 3 (three) times daily. 11/14/13    Shanker Levora Dredge, MD  ALPRAZolam Prudy Feeler) 0.5 MG tablet Take 1 tablet (0.5 mg total) by mouth every 8 (eight) hours as needed for anxiety. 11/14/13   Shanker Levora Dredge, MD  aspirin 325 MG tablet Take 1 tablet (325 mg total) by mouth daily. 11/14/13   Shanker Levora Dredge, MD  docusate sodium 100 MG CAPS Take 100 mg by mouth 2 (two) times daily. 11/14/13   Shanker Levora Dredge, MD  levalbuterol (XOPENEX) 0.63 MG/3ML nebulizer solution Take 3 mLs (0.63 mg total) by nebulization 3 (three) times daily. 11/14/13   Shanker Levora Dredge, MD  levalbuterol (XOPENEX) 0.63 MG/3ML nebulizer solution Take 3 mLs (0.63 mg total) by nebulization every 4 (four) hours as needed for wheezing or shortness of breath. 11/14/13   Shanker Levora Dredge, MD  metoprolol (LOPRESSOR) 50 MG tablet Take 1 tablet (50 mg total) by mouth 2 (two) times daily. 11/14/13   Shanker Levora Dredge, MD  ondansetron (ZOFRAN) 4 MG tablet Take 1 tablet (4 mg total) by mouth every 6 (six) hours as needed for nausea. 11/14/13   Shanker Levora Dredge, MD  PARoxetine (PAXIL) 10 MG tablet Take 1 tablet (10 mg total) by mouth daily. 11/14/13   Shanker Levora Dredge, MD  traMADol (ULTRAM) 50 MG tablet Take 1 tablet (50 mg total) by mouth every 6 (six) hours as needed for moderate pain. 11/14/13   Shanker Levora Dredge, MD  BP 185/69 mmHg  Pulse 52  Temp(Src) 98.3 F (36.8 C) (Oral)  Resp 23  SpO2 99% Physical Exam  Constitutional: She appears well-developed and well-nourished. No distress.  HENT:  Head: Normocephalic.  Eyes: Conjunctivae and EOM are normal.  Neck: Neck supple.  In c-collar  Cardiovascular: Normal rate, regular rhythm and normal heart sounds.   Pulmonary/Chest: Effort normal and breath sounds normal. No respiratory distress. She has no wheezes. She has no rales.  Abdominal: Soft. Bowel sounds are normal. She exhibits no distension. There is no tenderness. There is no rebound.  Musculoskeletal: She exhibits no edema.  Large contusion to the right elbow. Pain  with rom. No midline thoracic or lumbar spine tenderness. No pain with compression of the pelvis. Full rom of bilateral hips.   Neurological: She is alert. No cranial nerve deficit. Coordination normal.  oriented to self only. 5/5 and equal bilateral upper and lower extremity strength bilaterally  Skin: Skin is warm and dry.  Psychiatric: She has a normal mood and affect. Her behavior is normal.  Nursing note and vitals reviewed.   ED Course  Procedures (including critical care time) Labs Review Labs Reviewed - No data to display  Imaging Review Dg Chest 2 View  02/28/2015   CLINICAL DATA:  Decreased oxygen saturation.  EXAM: CHEST  2 VIEW  COMPARISON:  Single view of the chest 11/12/2013 and 11/10/2013.  FINDINGS: Punctate calcified granuloma is identified in the right lower lobe. The pulmonary interstitium is coarsened. No consolidative process, pneumothorax or pleural effusion is identified. Heart size is normal.  IMPRESSION: Chronic interstitial change.  No acute finding.   Electronically Signed   By: Drusilla Kannerhomas  Dalessio M.D.   On: 02/28/2015 11:48   Dg Elbow Complete Right  02/28/2015   CLINICAL DATA:  Pain following fall  EXAM: RIGHT ELBOW - COMPLETE 3+ VIEW  COMPARISON:  None.  FINDINGS: Frontal, lateral, and bilateral oblique views were obtained. There is no fracture or dislocation. No joint effusion. Joint spaces appear intact. No erosive change.  IMPRESSION: No fracture or dislocation.  No appreciable arthropathic change.   Electronically Signed   By: Bretta BangWilliam  Woodruff III M.D.   On: 02/28/2015 10:10   Ct Head Wo Contrast  02/28/2015   CLINICAL DATA:  Status post fall today.  Initial encounter.  EXAM: CT HEAD WITHOUT CONTRAST  CT CERVICAL SPINE WITHOUT CONTRAST  TECHNIQUE: Multidetector CT imaging of the head and cervical spine was performed following the standard protocol without intravenous contrast. Multiplanar CT image reconstructions of the cervical spine were also generated.   COMPARISON:  Head CT scan 11/10/2013.  FINDINGS: CT HEAD FINDINGS  Cortical atrophy and chronic microvascular ischemic change are identified. There is no evidence of acute intracranial abnormality including hemorrhage, infarct, mass lesion, mass effect, midline shift or abnormal extra-axial fluid collection. Cavum septum pellucidum is incidentally noted. Small bilateral mastoid effusions are unchanged. Mild mucosal thickening and a calcification dependently in the left sphenoid sinus is also unchanged.  CT CERVICAL SPINE FINDINGS  No fracture or malalignment of the cervical spine is identified. Scattered facet degenerative change is seen. Mild loss of disc space height is noted at C5-6. The lung apices are unremarkable.  IMPRESSION: No acute abnormality head or cervical spine.  Atrophy and chronic microvascular ischemic change.   Electronically Signed   By: Drusilla Kannerhomas  Dalessio M.D.   On: 02/28/2015 10:47   Ct Cervical Spine Wo Contrast  02/28/2015   CLINICAL DATA:  Status post fall today.  Initial encounter.  EXAM: CT HEAD WITHOUT CONTRAST  CT CERVICAL SPINE WITHOUT CONTRAST  TECHNIQUE: Multidetector CT imaging of the head and cervical spine was performed following the standard protocol without intravenous contrast. Multiplanar CT image reconstructions of the cervical spine were also generated.  COMPARISON:  Head CT scan 11/10/2013.  FINDINGS: CT HEAD FINDINGS  Cortical atrophy and chronic microvascular ischemic change are identified. There is no evidence of acute intracranial abnormality including hemorrhage, infarct, mass lesion, mass effect, midline shift or abnormal extra-axial fluid collection. Cavum septum pellucidum is incidentally noted. Small bilateral mastoid effusions are unchanged. Mild mucosal thickening and a calcification dependently in the left sphenoid sinus is also unchanged.  CT CERVICAL SPINE FINDINGS  No fracture or malalignment of the cervical spine is identified. Scattered facet degenerative  change is seen. Mild loss of disc space height is noted at C5-6. The lung apices are unremarkable.  IMPRESSION: No acute abnormality head or cervical spine.  Atrophy and chronic microvascular ischemic change.   Electronically Signed   By: Drusilla Kanner M.D.   On: 02/28/2015 10:47     EKG Interpretation None      MDM   Final diagnoses:  Fall, initial encounter  Elbow contusion, right, initial encounter    Pt with unwitnessed fall from SN[facility]. Found on the floor. Pt states she slipped while trying to get out of bed. Pt complaining of right elbow pain. Bruising noted to the elbow. She apparently was holding her head when she was found, however she denies any headache at this time. Pt denies any chest pain, dizziness, weakness, nausea. Will get ct head, cervical spine, right elbow xray. Pt is ambulatory since the fall.    Pt's xray and CTs are negative. Patient was found to desat into the upper 80s while in emergency department and was placed on oxygen. I took the oxygen off and patient seemed to be doing well without it and have oxygen saturation in the 90s. She did drop again, chest x-ray and breathing treatment was ordered. Patient stated she felt much better after. Her oxygen saturation is up to 100% on room air. Patient is anxious to go home. We'll discharge her home with close follow-up.  Filed Vitals:   02/28/15 1125 02/28/15 1128 02/28/15 1130 02/28/15 1145  BP: 171/64  154/59 142/61  Pulse: 53  51 62  Temp:      TempSrc:      Resp: SpO2: 88% 93% 91% 100%      Jaynie Crumble, PA-C 02/28/15 1409  Rolan Bucco, MD 02/28/15 1527

## 2015-02-28 NOTE — ED Notes (Signed)
Patient was ambulatory to the wheelchair with steady stand by assist gait.

## 2015-02-28 NOTE — ED Notes (Signed)
Pt given water to drink. Receiving breathing tx at this time. Family at bedside. Denies pain.

## 2015-03-12 DIAGNOSIS — B351 Tinea unguium: Secondary | ICD-10-CM | POA: Diagnosis not present

## 2015-03-12 DIAGNOSIS — M79675 Pain in left toe(s): Secondary | ICD-10-CM | POA: Diagnosis not present

## 2015-06-03 DIAGNOSIS — R296 Repeated falls: Secondary | ICD-10-CM | POA: Diagnosis not present

## 2015-06-03 DIAGNOSIS — M6281 Muscle weakness (generalized): Secondary | ICD-10-CM | POA: Diagnosis not present

## 2015-06-04 DIAGNOSIS — M6281 Muscle weakness (generalized): Secondary | ICD-10-CM | POA: Diagnosis not present

## 2015-06-04 DIAGNOSIS — R296 Repeated falls: Secondary | ICD-10-CM | POA: Diagnosis not present

## 2015-06-06 DIAGNOSIS — R296 Repeated falls: Secondary | ICD-10-CM | POA: Diagnosis not present

## 2015-06-06 DIAGNOSIS — M6281 Muscle weakness (generalized): Secondary | ICD-10-CM | POA: Diagnosis not present

## 2015-06-07 DIAGNOSIS — R296 Repeated falls: Secondary | ICD-10-CM | POA: Diagnosis not present

## 2015-06-07 DIAGNOSIS — M6281 Muscle weakness (generalized): Secondary | ICD-10-CM | POA: Diagnosis not present

## 2015-06-11 DIAGNOSIS — M79675 Pain in left toe(s): Secondary | ICD-10-CM | POA: Diagnosis not present

## 2015-06-11 DIAGNOSIS — B351 Tinea unguium: Secondary | ICD-10-CM | POA: Diagnosis not present

## 2015-06-13 DIAGNOSIS — R296 Repeated falls: Secondary | ICD-10-CM | POA: Diagnosis not present

## 2015-06-13 DIAGNOSIS — M6281 Muscle weakness (generalized): Secondary | ICD-10-CM | POA: Diagnosis not present

## 2015-06-14 DIAGNOSIS — M6281 Muscle weakness (generalized): Secondary | ICD-10-CM | POA: Diagnosis not present

## 2015-06-14 DIAGNOSIS — R296 Repeated falls: Secondary | ICD-10-CM | POA: Diagnosis not present

## 2015-06-18 DIAGNOSIS — R296 Repeated falls: Secondary | ICD-10-CM | POA: Diagnosis not present

## 2015-06-18 DIAGNOSIS — M6281 Muscle weakness (generalized): Secondary | ICD-10-CM | POA: Diagnosis not present

## 2015-06-19 DIAGNOSIS — R296 Repeated falls: Secondary | ICD-10-CM | POA: Diagnosis not present

## 2015-06-19 DIAGNOSIS — M6281 Muscle weakness (generalized): Secondary | ICD-10-CM | POA: Diagnosis not present

## 2015-06-22 DIAGNOSIS — M6281 Muscle weakness (generalized): Secondary | ICD-10-CM | POA: Diagnosis not present

## 2015-06-22 DIAGNOSIS — R296 Repeated falls: Secondary | ICD-10-CM | POA: Diagnosis not present

## 2015-06-27 DIAGNOSIS — M6281 Muscle weakness (generalized): Secondary | ICD-10-CM | POA: Diagnosis not present

## 2015-06-27 DIAGNOSIS — R296 Repeated falls: Secondary | ICD-10-CM | POA: Diagnosis not present

## 2015-06-28 DIAGNOSIS — M6281 Muscle weakness (generalized): Secondary | ICD-10-CM | POA: Diagnosis not present

## 2015-06-28 DIAGNOSIS — R296 Repeated falls: Secondary | ICD-10-CM | POA: Diagnosis not present

## 2015-07-01 DIAGNOSIS — R296 Repeated falls: Secondary | ICD-10-CM | POA: Diagnosis not present

## 2015-07-01 DIAGNOSIS — M6281 Muscle weakness (generalized): Secondary | ICD-10-CM | POA: Diagnosis not present

## 2015-07-03 DIAGNOSIS — R296 Repeated falls: Secondary | ICD-10-CM | POA: Diagnosis not present

## 2015-07-03 DIAGNOSIS — M6281 Muscle weakness (generalized): Secondary | ICD-10-CM | POA: Diagnosis not present

## 2015-07-11 DIAGNOSIS — R2689 Other abnormalities of gait and mobility: Secondary | ICD-10-CM | POA: Diagnosis not present

## 2015-07-11 DIAGNOSIS — M6281 Muscle weakness (generalized): Secondary | ICD-10-CM | POA: Diagnosis not present

## 2015-07-11 DIAGNOSIS — R262 Difficulty in walking, not elsewhere classified: Secondary | ICD-10-CM | POA: Diagnosis not present

## 2015-07-14 DIAGNOSIS — R262 Difficulty in walking, not elsewhere classified: Secondary | ICD-10-CM | POA: Diagnosis not present

## 2015-07-14 DIAGNOSIS — M6281 Muscle weakness (generalized): Secondary | ICD-10-CM | POA: Diagnosis not present

## 2015-07-14 DIAGNOSIS — R2689 Other abnormalities of gait and mobility: Secondary | ICD-10-CM | POA: Diagnosis not present

## 2015-07-16 DIAGNOSIS — R2689 Other abnormalities of gait and mobility: Secondary | ICD-10-CM | POA: Diagnosis not present

## 2015-07-16 DIAGNOSIS — R262 Difficulty in walking, not elsewhere classified: Secondary | ICD-10-CM | POA: Diagnosis not present

## 2015-07-16 DIAGNOSIS — M6281 Muscle weakness (generalized): Secondary | ICD-10-CM | POA: Diagnosis not present

## 2015-07-17 DIAGNOSIS — R262 Difficulty in walking, not elsewhere classified: Secondary | ICD-10-CM | POA: Diagnosis not present

## 2015-07-17 DIAGNOSIS — R2689 Other abnormalities of gait and mobility: Secondary | ICD-10-CM | POA: Diagnosis not present

## 2015-07-17 DIAGNOSIS — M6281 Muscle weakness (generalized): Secondary | ICD-10-CM | POA: Diagnosis not present

## 2015-07-19 DIAGNOSIS — R262 Difficulty in walking, not elsewhere classified: Secondary | ICD-10-CM | POA: Diagnosis not present

## 2015-07-19 DIAGNOSIS — R2689 Other abnormalities of gait and mobility: Secondary | ICD-10-CM | POA: Diagnosis not present

## 2015-07-19 DIAGNOSIS — M6281 Muscle weakness (generalized): Secondary | ICD-10-CM | POA: Diagnosis not present

## 2015-07-22 DIAGNOSIS — R262 Difficulty in walking, not elsewhere classified: Secondary | ICD-10-CM | POA: Diagnosis not present

## 2015-07-22 DIAGNOSIS — R2689 Other abnormalities of gait and mobility: Secondary | ICD-10-CM | POA: Diagnosis not present

## 2015-07-22 DIAGNOSIS — M6281 Muscle weakness (generalized): Secondary | ICD-10-CM | POA: Diagnosis not present

## 2015-07-23 DIAGNOSIS — I10 Essential (primary) hypertension: Secondary | ICD-10-CM | POA: Diagnosis not present

## 2015-07-23 DIAGNOSIS — E782 Mixed hyperlipidemia: Secondary | ICD-10-CM | POA: Diagnosis not present

## 2015-07-23 DIAGNOSIS — K047 Periapical abscess without sinus: Secondary | ICD-10-CM | POA: Diagnosis not present

## 2015-07-23 DIAGNOSIS — I480 Paroxysmal atrial fibrillation: Secondary | ICD-10-CM | POA: Diagnosis not present

## 2015-08-22 DIAGNOSIS — J440 Chronic obstructive pulmonary disease with acute lower respiratory infection: Secondary | ICD-10-CM | POA: Diagnosis not present

## 2015-08-23 DIAGNOSIS — J440 Chronic obstructive pulmonary disease with acute lower respiratory infection: Secondary | ICD-10-CM | POA: Diagnosis not present

## 2015-09-13 DIAGNOSIS — M79672 Pain in left foot: Secondary | ICD-10-CM | POA: Diagnosis not present

## 2015-09-13 DIAGNOSIS — B351 Tinea unguium: Secondary | ICD-10-CM | POA: Diagnosis not present

## 2015-09-13 DIAGNOSIS — M79675 Pain in left toe(s): Secondary | ICD-10-CM | POA: Diagnosis not present

## 2015-09-16 DIAGNOSIS — Z23 Encounter for immunization: Secondary | ICD-10-CM | POA: Diagnosis not present

## 2015-09-25 DIAGNOSIS — J449 Chronic obstructive pulmonary disease, unspecified: Secondary | ICD-10-CM | POA: Diagnosis not present

## 2015-09-25 DIAGNOSIS — J209 Acute bronchitis, unspecified: Secondary | ICD-10-CM | POA: Diagnosis not present

## 2015-11-02 DIAGNOSIS — M6281 Muscle weakness (generalized): Secondary | ICD-10-CM | POA: Diagnosis not present

## 2015-11-02 DIAGNOSIS — R296 Repeated falls: Secondary | ICD-10-CM | POA: Diagnosis not present

## 2015-11-04 DIAGNOSIS — R262 Difficulty in walking, not elsewhere classified: Secondary | ICD-10-CM | POA: Diagnosis not present

## 2015-11-04 DIAGNOSIS — M6281 Muscle weakness (generalized): Secondary | ICD-10-CM | POA: Diagnosis not present

## 2015-11-04 DIAGNOSIS — R2689 Other abnormalities of gait and mobility: Secondary | ICD-10-CM | POA: Diagnosis not present

## 2015-11-05 DIAGNOSIS — R2689 Other abnormalities of gait and mobility: Secondary | ICD-10-CM | POA: Diagnosis not present

## 2015-11-05 DIAGNOSIS — M6281 Muscle weakness (generalized): Secondary | ICD-10-CM | POA: Diagnosis not present

## 2015-11-05 DIAGNOSIS — R262 Difficulty in walking, not elsewhere classified: Secondary | ICD-10-CM | POA: Diagnosis not present

## 2015-11-06 DIAGNOSIS — Z23 Encounter for immunization: Secondary | ICD-10-CM | POA: Diagnosis not present

## 2015-11-07 DIAGNOSIS — M6281 Muscle weakness (generalized): Secondary | ICD-10-CM | POA: Diagnosis not present

## 2015-11-07 DIAGNOSIS — R2689 Other abnormalities of gait and mobility: Secondary | ICD-10-CM | POA: Diagnosis not present

## 2015-11-07 DIAGNOSIS — R262 Difficulty in walking, not elsewhere classified: Secondary | ICD-10-CM | POA: Diagnosis not present

## 2015-11-12 DIAGNOSIS — R2689 Other abnormalities of gait and mobility: Secondary | ICD-10-CM | POA: Diagnosis not present

## 2015-11-12 DIAGNOSIS — M6281 Muscle weakness (generalized): Secondary | ICD-10-CM | POA: Diagnosis not present

## 2015-11-12 DIAGNOSIS — R262 Difficulty in walking, not elsewhere classified: Secondary | ICD-10-CM | POA: Diagnosis not present

## 2015-11-13 DIAGNOSIS — M6281 Muscle weakness (generalized): Secondary | ICD-10-CM | POA: Diagnosis not present

## 2015-11-13 DIAGNOSIS — R262 Difficulty in walking, not elsewhere classified: Secondary | ICD-10-CM | POA: Diagnosis not present

## 2015-11-13 DIAGNOSIS — R2689 Other abnormalities of gait and mobility: Secondary | ICD-10-CM | POA: Diagnosis not present

## 2015-11-15 DIAGNOSIS — M6281 Muscle weakness (generalized): Secondary | ICD-10-CM | POA: Diagnosis not present

## 2015-11-15 DIAGNOSIS — R262 Difficulty in walking, not elsewhere classified: Secondary | ICD-10-CM | POA: Diagnosis not present

## 2015-11-15 DIAGNOSIS — R2689 Other abnormalities of gait and mobility: Secondary | ICD-10-CM | POA: Diagnosis not present

## 2015-11-16 DIAGNOSIS — M6281 Muscle weakness (generalized): Secondary | ICD-10-CM | POA: Diagnosis not present

## 2015-11-16 DIAGNOSIS — R262 Difficulty in walking, not elsewhere classified: Secondary | ICD-10-CM | POA: Diagnosis not present

## 2015-11-16 DIAGNOSIS — R2689 Other abnormalities of gait and mobility: Secondary | ICD-10-CM | POA: Diagnosis not present

## 2015-11-18 DIAGNOSIS — R2689 Other abnormalities of gait and mobility: Secondary | ICD-10-CM | POA: Diagnosis not present

## 2015-11-18 DIAGNOSIS — R262 Difficulty in walking, not elsewhere classified: Secondary | ICD-10-CM | POA: Diagnosis not present

## 2015-11-18 DIAGNOSIS — M6281 Muscle weakness (generalized): Secondary | ICD-10-CM | POA: Diagnosis not present

## 2015-11-19 DIAGNOSIS — M6281 Muscle weakness (generalized): Secondary | ICD-10-CM | POA: Diagnosis not present

## 2015-11-19 DIAGNOSIS — R262 Difficulty in walking, not elsewhere classified: Secondary | ICD-10-CM | POA: Diagnosis not present

## 2015-11-19 DIAGNOSIS — R2689 Other abnormalities of gait and mobility: Secondary | ICD-10-CM | POA: Diagnosis not present

## 2015-11-20 DIAGNOSIS — R2689 Other abnormalities of gait and mobility: Secondary | ICD-10-CM | POA: Diagnosis not present

## 2015-11-20 DIAGNOSIS — M6281 Muscle weakness (generalized): Secondary | ICD-10-CM | POA: Diagnosis not present

## 2015-11-20 DIAGNOSIS — R262 Difficulty in walking, not elsewhere classified: Secondary | ICD-10-CM | POA: Diagnosis not present

## 2015-11-21 DIAGNOSIS — R262 Difficulty in walking, not elsewhere classified: Secondary | ICD-10-CM | POA: Diagnosis not present

## 2015-11-21 DIAGNOSIS — R2689 Other abnormalities of gait and mobility: Secondary | ICD-10-CM | POA: Diagnosis not present

## 2015-11-21 DIAGNOSIS — M6281 Muscle weakness (generalized): Secondary | ICD-10-CM | POA: Diagnosis not present

## 2015-11-22 DIAGNOSIS — R2689 Other abnormalities of gait and mobility: Secondary | ICD-10-CM | POA: Diagnosis not present

## 2015-11-22 DIAGNOSIS — M6281 Muscle weakness (generalized): Secondary | ICD-10-CM | POA: Diagnosis not present

## 2015-11-22 DIAGNOSIS — R262 Difficulty in walking, not elsewhere classified: Secondary | ICD-10-CM | POA: Diagnosis not present

## 2015-11-25 DIAGNOSIS — R262 Difficulty in walking, not elsewhere classified: Secondary | ICD-10-CM | POA: Diagnosis not present

## 2015-11-25 DIAGNOSIS — R2689 Other abnormalities of gait and mobility: Secondary | ICD-10-CM | POA: Diagnosis not present

## 2015-11-25 DIAGNOSIS — M6281 Muscle weakness (generalized): Secondary | ICD-10-CM | POA: Diagnosis not present

## 2015-11-26 DIAGNOSIS — R262 Difficulty in walking, not elsewhere classified: Secondary | ICD-10-CM | POA: Diagnosis not present

## 2015-11-26 DIAGNOSIS — M6281 Muscle weakness (generalized): Secondary | ICD-10-CM | POA: Diagnosis not present

## 2015-11-26 DIAGNOSIS — R2689 Other abnormalities of gait and mobility: Secondary | ICD-10-CM | POA: Diagnosis not present

## 2015-11-27 DIAGNOSIS — R262 Difficulty in walking, not elsewhere classified: Secondary | ICD-10-CM | POA: Diagnosis not present

## 2015-11-27 DIAGNOSIS — R2689 Other abnormalities of gait and mobility: Secondary | ICD-10-CM | POA: Diagnosis not present

## 2015-11-27 DIAGNOSIS — M6281 Muscle weakness (generalized): Secondary | ICD-10-CM | POA: Diagnosis not present

## 2015-11-29 DIAGNOSIS — M6281 Muscle weakness (generalized): Secondary | ICD-10-CM | POA: Diagnosis not present

## 2015-11-29 DIAGNOSIS — R2689 Other abnormalities of gait and mobility: Secondary | ICD-10-CM | POA: Diagnosis not present

## 2015-11-29 DIAGNOSIS — R262 Difficulty in walking, not elsewhere classified: Secondary | ICD-10-CM | POA: Diagnosis not present

## 2015-12-03 DIAGNOSIS — M6281 Muscle weakness (generalized): Secondary | ICD-10-CM | POA: Diagnosis not present

## 2015-12-03 DIAGNOSIS — R262 Difficulty in walking, not elsewhere classified: Secondary | ICD-10-CM | POA: Diagnosis not present

## 2015-12-03 DIAGNOSIS — R2689 Other abnormalities of gait and mobility: Secondary | ICD-10-CM | POA: Diagnosis not present

## 2015-12-05 DIAGNOSIS — M6281 Muscle weakness (generalized): Secondary | ICD-10-CM | POA: Diagnosis not present

## 2015-12-05 DIAGNOSIS — R296 Repeated falls: Secondary | ICD-10-CM | POA: Diagnosis not present

## 2015-12-09 DIAGNOSIS — R296 Repeated falls: Secondary | ICD-10-CM | POA: Diagnosis not present

## 2015-12-09 DIAGNOSIS — M6281 Muscle weakness (generalized): Secondary | ICD-10-CM | POA: Diagnosis not present

## 2015-12-11 DIAGNOSIS — M6281 Muscle weakness (generalized): Secondary | ICD-10-CM | POA: Diagnosis not present

## 2015-12-11 DIAGNOSIS — R296 Repeated falls: Secondary | ICD-10-CM | POA: Diagnosis not present

## 2015-12-13 DIAGNOSIS — R296 Repeated falls: Secondary | ICD-10-CM | POA: Diagnosis not present

## 2015-12-13 DIAGNOSIS — M6281 Muscle weakness (generalized): Secondary | ICD-10-CM | POA: Diagnosis not present

## 2015-12-17 DIAGNOSIS — R296 Repeated falls: Secondary | ICD-10-CM | POA: Diagnosis not present

## 2015-12-17 DIAGNOSIS — M6281 Muscle weakness (generalized): Secondary | ICD-10-CM | POA: Diagnosis not present

## 2015-12-18 DIAGNOSIS — M6281 Muscle weakness (generalized): Secondary | ICD-10-CM | POA: Diagnosis not present

## 2015-12-18 DIAGNOSIS — R296 Repeated falls: Secondary | ICD-10-CM | POA: Diagnosis not present

## 2016-03-11 ENCOUNTER — Emergency Department (HOSPITAL_BASED_OUTPATIENT_CLINIC_OR_DEPARTMENT_OTHER): Payer: Medicare Other

## 2016-03-11 ENCOUNTER — Inpatient Hospital Stay (HOSPITAL_BASED_OUTPATIENT_CLINIC_OR_DEPARTMENT_OTHER)
Admission: EM | Admit: 2016-03-11 | Discharge: 2016-03-13 | DRG: 689 | Disposition: A | Discharge status: Home / Self Care | Payer: Medicare Other | Attending: Family Medicine | Admitting: Family Medicine

## 2016-03-11 ENCOUNTER — Encounter (HOSPITAL_BASED_OUTPATIENT_CLINIC_OR_DEPARTMENT_OTHER): Payer: Self-pay | Admitting: Emergency Medicine

## 2016-03-11 DIAGNOSIS — N390 Urinary tract infection, site not specified: Secondary | ICD-10-CM

## 2016-03-11 DIAGNOSIS — G92 Toxic encephalopathy: Secondary | ICD-10-CM | POA: Diagnosis present

## 2016-03-11 DIAGNOSIS — R4182 Altered mental status, unspecified: Secondary | ICD-10-CM | POA: Insufficient documentation

## 2016-03-11 DIAGNOSIS — R0902 Hypoxemia: Secondary | ICD-10-CM | POA: Diagnosis not present

## 2016-03-11 DIAGNOSIS — G9340 Encephalopathy, unspecified: Secondary | ICD-10-CM | POA: Diagnosis not present

## 2016-03-11 DIAGNOSIS — F0391 Unspecified dementia with behavioral disturbance: Secondary | ICD-10-CM | POA: Diagnosis not present

## 2016-03-11 DIAGNOSIS — N12 Tubulo-interstitial nephritis, not specified as acute or chronic: Secondary | ICD-10-CM | POA: Diagnosis not present

## 2016-03-11 DIAGNOSIS — I2699 Other pulmonary embolism without acute cor pulmonale: Secondary | ICD-10-CM | POA: Diagnosis not present

## 2016-03-11 DIAGNOSIS — Z66 Do not resuscitate: Secondary | ICD-10-CM | POA: Diagnosis present

## 2016-03-11 DIAGNOSIS — I4891 Unspecified atrial fibrillation: Secondary | ICD-10-CM | POA: Diagnosis present

## 2016-03-11 DIAGNOSIS — I4892 Unspecified atrial flutter: Secondary | ICD-10-CM | POA: Diagnosis present

## 2016-03-11 DIAGNOSIS — B9620 Unspecified Escherichia coli [E. coli] as the cause of diseases classified elsewhere: Secondary | ICD-10-CM | POA: Diagnosis present

## 2016-03-11 DIAGNOSIS — J449 Chronic obstructive pulmonary disease, unspecified: Secondary | ICD-10-CM | POA: Diagnosis present

## 2016-03-11 DIAGNOSIS — J9601 Acute respiratory failure with hypoxia: Secondary | ICD-10-CM | POA: Diagnosis present

## 2016-03-11 DIAGNOSIS — F03918 Unspecified dementia, unspecified severity, with other behavioral disturbance: Secondary | ICD-10-CM | POA: Diagnosis present

## 2016-03-11 DIAGNOSIS — Z9181 History of falling: Secondary | ICD-10-CM

## 2016-03-11 HISTORY — DX: Unspecified asthma, uncomplicated: J45.909

## 2016-03-11 LAB — URINALYSIS, ROUTINE W REFLEX MICROSCOPIC
Glucose, UA: NEGATIVE mg/dL
Ketones, ur: 15 mg/dL — AB
NITRITE: POSITIVE — AB
PH: 5.5 (ref 5.0–8.0)
Protein, ur: 100 mg/dL — AB
SPECIFIC GRAVITY, URINE: 1.019 (ref 1.005–1.030)

## 2016-03-11 LAB — COMPREHENSIVE METABOLIC PANEL
ALT: 10 U/L — ABNORMAL LOW (ref 14–54)
ANION GAP: 5 (ref 5–15)
AST: 19 U/L (ref 15–41)
Albumin: 3.5 g/dL (ref 3.5–5.0)
Alkaline Phosphatase: 59 U/L (ref 38–126)
BILIRUBIN TOTAL: 0.9 mg/dL (ref 0.3–1.2)
BUN: 14 mg/dL (ref 6–20)
CO2: 27 mmol/L (ref 22–32)
Calcium: 8.5 mg/dL — ABNORMAL LOW (ref 8.9–10.3)
Chloride: 101 mmol/L (ref 101–111)
Creatinine, Ser: 0.98 mg/dL (ref 0.44–1.00)
GFR, EST AFRICAN AMERICAN: 59 mL/min — AB (ref >60)
GFR, EST NON AFRICAN AMERICAN: 51 mL/min — AB (ref >60)
Glucose, Bld: 188 mg/dL — ABNORMAL HIGH (ref 65–99)
POTASSIUM: 3.7 mmol/L (ref 3.5–5.1)
Sodium: 133 mmol/L — ABNORMAL LOW (ref 135–145)
TOTAL PROTEIN: 7.1 g/dL (ref 6.5–8.1)

## 2016-03-11 LAB — I-STAT ARTERIAL BLOOD GAS, ED
ACID-BASE EXCESS: 2 mmol/L (ref 0.0–2.0)
BICARBONATE: 27.8 meq/L — AB (ref 20.0–24.0)
O2 Saturation: 95 %
TCO2: 29 mmol/L (ref 0–100)
pCO2 arterial: 48 mmHg — ABNORMAL HIGH (ref 35.0–45.0)
pH, Arterial: 7.371 (ref 7.350–7.450)
pO2, Arterial: 80 mmHg (ref 80.0–100.0)

## 2016-03-11 LAB — CBC WITH DIFFERENTIAL/PLATELET
Basophils Absolute: 0 10*3/uL (ref 0.0–0.1)
Basophils Relative: 0 %
EOS ABS: 0 10*3/uL (ref 0.0–0.7)
Eosinophils Relative: 0 %
HCT: 40.8 % (ref 36.0–46.0)
Hemoglobin: 13.3 g/dL (ref 12.0–15.0)
LYMPHS PCT: 8 %
Lymphs Abs: 0.8 10*3/uL (ref 0.7–4.0)
MCH: 31 pg (ref 26.0–34.0)
MCHC: 32.6 g/dL (ref 30.0–36.0)
MCV: 95.1 fL (ref 78.0–100.0)
Monocytes Absolute: 1 10*3/uL (ref 0.1–1.0)
Monocytes Relative: 11 %
NEUTROS PCT: 81 %
Neutro Abs: 7.4 10*3/uL (ref 1.7–7.7)
PLATELETS: 208 10*3/uL (ref 150–400)
RBC: 4.29 MIL/uL (ref 3.87–5.11)
RDW: 13.7 % (ref 11.5–15.5)
WBC: 9.2 10*3/uL (ref 4.0–10.5)

## 2016-03-11 LAB — URINE MICROSCOPIC-ADD ON

## 2016-03-11 LAB — LIPASE, BLOOD: Lipase: 18 U/L (ref 11–51)

## 2016-03-11 MED ORDER — SODIUM CHLORIDE 0.9 % IV BOLUS (SEPSIS)
500.0000 mL | Freq: Once | INTRAVENOUS | Status: AC
  Administered 2016-03-11: 500 mL via INTRAVENOUS

## 2016-03-11 MED ORDER — SODIUM CHLORIDE 0.9 % IV SOLN: INTRAVENOUS | Status: DC

## 2016-03-11 MED ORDER — DEXTROSE 5 % IV SOLN
1.0000 g | Freq: Once | INTRAVENOUS | Status: AC
  Administered 2016-03-11: 1 g via INTRAVENOUS
  Filled 2016-03-11: qty 10

## 2016-03-11 MED ORDER — SODIUM CHLORIDE 0.9 % IV SOLN
INTRAVENOUS | Status: DC
  Administered 2016-03-11 (×2): via INTRAVENOUS

## 2016-03-11 NOTE — ED Notes (Signed)
Report given to Assurantereasa RN on [address]

## 2016-03-11 NOTE — ED Notes (Signed)
Attempted to call report nurse not available

## 2016-03-11 NOTE — ED Notes (Signed)
Attempted to call report again, nurse not available.

## 2016-03-11 NOTE — ED Provider Notes (Addendum)
CSN: [id number]     Arrival date & time 03/11/16  1056 History   First MD Initiated Contact with Patient 03/11/16 1108     Chief Complaint  Patient presents with  . Altered Mental Status     (Consider location/radiation/quality/duration/timing/severity/associated sxs/prior Treatment) Patient is a 82 y.o. female presenting with altered mental status. The history is provided by the patient and a relative.  Altered Mental Status [facility]. Patient has a baseline history of dementia but has had increased confusion since Monday. Family member states this is usually do to a urinary tract infection. Patient has a history of COPD but was never a smoker. Patient is not on home oxygen. Patient arrived here hypoxic with oxygen sats 86%. Family member states is been no nausea vomiting or diarrhea she has been complaining of some low back pain. There has been no falls. Has been no fevers that they know of.  Past Medical History  Diagnosis Date  . COPD (chronic obstructive pulmonary disease) (HCC)   . Dementia   . Atrial fibrillation [facility](HCC)    Past Surgical History  Procedure Laterality Date  . Orif ankle fracture Left 11/11/2013    Procedure: OPEN REDUCTION INTERNAL FIXATION (ORIF) ANKLE FRACTURE;  Surgeon: Nadara MustardMarcus V Duda, MD;  Location: [facility] OR;  Service: Orthopedics;  Laterality: Left;   No family history on file. Social History  Substance Use Topics  . Smoking status: Never Smoker   . Smokeless tobacco: None  . Alcohol Use: No   OB History    No data available     Review of Systems  Unable to perform ROS: Dementia  Level V caveat applies    Allergies  Review of patient's allergies indicates no known allergies.  Home Medications   Prior to Admission medications   Medication Sig Start Date End Date Taking? Authorizing Provider  acetaminophen (TYLENOL) 500 MG tablet Take 2 tablets (1,000 mg total) by mouth 3 (three) times daily. 11/14/13   Yes Shanker Levora DredgeM Ghimire, MD  ALPRAZolam Prudy Feeler(XANAX) 0.5 MG tablet Take 1 tablet (0.5 mg total) by mouth every 8 (eight) hours as needed for anxiety. Patient taking differently: Take 0.5 mg by mouth 2 (two) times daily.  11/14/13  Yes Shanker Levora DredgeM Ghimire, MD  aspirin 325 MG tablet Take 1 tablet (325 mg total) by mouth daily. 11/14/13  Yes Shanker Levora DredgeM Ghimire, MD  docusate sodium 100 MG CAPS Take 100 mg by mouth 2 (two) times daily. Patient taking differently: Take 100 mg by mouth daily.  11/14/13  Yes Shanker Levora DredgeM Ghimire, MD  levalbuterol (XOPENEX) 0.63 MG/3ML nebulizer solution Take 3 mLs (0.63 mg total) by nebulization 3 (three) times daily. 11/14/13  Yes Shanker Levora DredgeM Ghimire, MD  metoprolol (LOPRESSOR) 50 MG tablet Take 1 tablet (50 mg total) by mouth 2 (two) times daily. 11/14/13  Yes Shanker Levora DredgeM Ghimire, MD  omeprazole (PRILOSEC) 20 MG capsule Take 20 mg by mouth every morning.   Yes Historical Provider, MD  PARoxetine (PAXIL) 10 MG tablet Take 1 tablet (10 mg total) by mouth daily. 11/14/13  Yes Shanker Levora DredgeM Ghimire, MD  alum & mag hydroxide-simeth (MINTOX) 200-200-20 MG/5ML suspension Take 30 mLs by mouth every 6 (six) hours as needed for indigestion or heartburn.    Historical Provider, MD  guaifenesin (ROBITUSSIN) 100 MG/5ML syrup Take 200 mg by mouth 4 (four) times daily as needed for cough.    Historical Provider, MD  loperamide (IMODIUM) 2 MG capsule Take 2 mg  by mouth as needed for diarrhea or loose stools.    Historical Provider, MD  magnesium hydroxide (MILK OF MAGNESIA) 400 MG/5ML suspension Take 30 mLs by mouth daily as needed for mild constipation.    Historical Provider, MD  Neomycin-Bacitracin-Polymyxin (TRIPLE ANTIBIOTIC) [phone] OINT Apply 1 application topically as needed.    Historical Provider, MD   BP 130/56 mmHg  Pulse 67  Temp(Src) 99.2 F (37.3 C) (Oral)  Resp 27  SpO2 95% Physical Exam  Constitutional: She appears well-developed and well-nourished. No distress.  HENT:  Head:  Normocephalic and atraumatic.  Mucous membranes dry  Eyes: Conjunctivae and EOM are normal. Pupils are equal, round, and reactive to light.  Neck: Normal range of motion. Neck supple.  Cardiovascular: Normal rate, regular rhythm and normal heart sounds.   Pulmonary/Chest: Effort normal and breath sounds normal. No respiratory distress. She has no wheezes. She has no rales.  Abdominal: Soft. Bowel sounds are normal. She exhibits no distension. There is no tenderness.  Musculoskeletal: Normal range of motion. She exhibits no edema.  Neurological: No cranial nerve deficit. She exhibits normal muscle tone. Coordination normal.  Drowsy but will follow commands.  Skin: Skin is warm. No rash noted.  Nursing note and vitals reviewed.   ED Course  Procedures (including critical care time) Labs Review Labs Reviewed  URINALYSIS, ROUTINE W REFLEX MICROSCOPIC (NOT AT [facility]) - Abnormal; Notable for the following:    Color, Urine AMBER (*)    APPearance TURBID (*)    Hgb urine dipstick MODERATE (*)    Bilirubin Urine SMALL (*)    Ketones, ur 15 (*)    Protein, ur 100 (*)    Nitrite POSITIVE (*)    Leukocytes, UA LARGE (*)    All other components within normal limits  COMPREHENSIVE METABOLIC PANEL - Abnormal; Notable for the following:    Sodium 133 (*)    Glucose, Bld 188 (*)    Calcium 8.5 (*)    ALT 10 (*)    GFR calc non Af Amer 51 (*)    GFR calc Af Amer 59 (*)    All other components within normal limits  URINE MICROSCOPIC-ADD ON - Abnormal; Notable for the following:    Squamous Epithelial / LPF 0-5 (*)    Bacteria, UA MANY (*)    All other components within normal limits  I-STAT ARTERIAL BLOOD GAS, ED - Abnormal; Notable for the following:    pCO2 arterial 48.0 (*)    Bicarbonate 27.8 (*)    All other components within normal limits  URINE CULTURE  CBC WITH DIFFERENTIAL/PLATELET  LIPASE, BLOOD    Imaging Review Dg Chest 2 View  03/11/2016  CLINICAL DATA:  Altered mental  status with hypoxia EXAM: CHEST  2 VIEW COMPARISON:  02/28/2015 FINDINGS: Chronic bronchitic markings. Low volume chest without consolidation or edema. Calcified granuloma over the right chest. Normal heart size and stable mediastinal contours allowing for distortion by rightward rotation. IMPRESSION: Chronic bronchitic markings.  No evidence of acute disease. Electronically Signed   By: Marnee Spring M.D.   On: 03/11/2016 12:48   Ct Head Wo Contrast  03/11/2016  CLINICAL DATA:  Altered mental status. EXAM: CT HEAD WITHOUT CONTRAST TECHNIQUE: Contiguous axial images were obtained from the base of the skull through the vertex without intravenous contrast. COMPARISON:  February 28, 2015. FINDINGS: Bony calvarium appears intact. Mild diffuse cortical atrophy is noted. Mild chronic ischemic white matter disease is noted. No mass effect or midline shift  is noted. Ventricular size is within normal limits. There is no evidence of mass lesion, hemorrhage or acute infarction. IMPRESSION: Mild diffuse cortical atrophy. Mild chronic ischemic white matter disease. No acute intracranial abnormality seen. Electronically Signed   By: Lupita Raider, M.D.   On: 03/11/2016 12:55   I have personally reviewed and evaluated these images and lab results as part of my medical decision-making.   EKG Interpretation   Date/Time:  Wednesday Mar 11 2016 12:00:27 EDT Ventricular Rate:  70 PR Interval:  176 QRS Duration: 88 QT Interval:  411 QTC Calculation: 443 R Axis:   14 Text Interpretation:  Sinus rhythm Borderline repolarization abnormality  Baseline wander in lead(s) V6 No significant change since last tracing  Confirmed by Honesti Seaberg  MD, Adonica Fukushima [phone]) on 03/11/2016 12:08:07 PM      MDM   Final diagnoses:  Hypoxia  UTI (lower urinary tract infection)  Altered mental status, unspecified altered mental status type    Patient brought in by daughter-in-law from [facility]. Since Monday patient said increased  confusion. Patient's been complaining of some low back pain. No history of any falls. Patient does have a history of dementia but she's more confused than her baseline. Family member states this is usually when she has a urinary tract infection. Patient is known to have COPD. Was not a smoker. Patient is not normally on oxygen. Patient presented here with oxygen sats down to 86% on room air. No wheezing.  Workup does show evidence of urinary tract infection patient started on Rocephin and urine culture sent. Patient also continues to drop oxygen sats on room air down to 86-87%. On 2 L patient's oxygen saturation remained above 90%. Chest x-ray negative for pneumonia. Labs otherwise without significant abnormalities.  Arterial blood gas shows a mild retention of carbon dioxide. Clinically no concerns for sepsis based on no fever no tachycardia no hypotension. Patient is comfortable but more sleepy than usual. Patient will follow commands. In addition head CT was negative for any acute findings.  Due to the hypoxia in the increased confusion patient will require admission. Patient may need long-term oxygen treatment.    Vanetta Mulders, MD 03/11/16 1427   Discussed with Dr. Margot Ables triad hospitalist at [place]. Patient will be admitted to telemetry unit regular admission. Patient will be transported there by [facility]. Patient's admitting orders and [place] completed.  Vanetta Mulders, MD 03/11/16 1501

## 2016-03-11 NOTE — ED Notes (Signed)
Mulitple attempts to call report by Sue LushAndrea, RN. [facility] called, states to call charge nurse directly on her portable phone. [phone].

## 2016-03-11 NOTE — Progress Notes (Signed)
[facility] coming from [facility].  Seen by Dr. Deretha EmoryZackowski Patient presenting with altered mental status likely secondary to UTI/early pyelonephritis, and hypoxia. Patient is not septic and stable for transfer to telemetry bed Suspect hypoxia is likely from somnolent state with baseline COPD. Patient started on appropriate therapies including ceftriaxone.  Shelly Flattenavid Joyce Leckey, MD [facility] 03/11/2016, 2:59 PM

## 2016-03-11 NOTE — ED Notes (Signed)
Per daughter in law:  Pt having increase in confusion since Monday.  Pt c/o lower back pain and not acting like herself.  No recent injury or cough.  Oxygen sats 86%on room air, placed on [address].  Pt from [facility], staff requesting urinalysis.

## 2016-03-11 NOTE — ED Notes (Signed)
Patient changed room air. SpO2 99%, HR 68, RR 18.

## 2016-03-11 NOTE — Progress Notes (Signed)
Received report from RN, Sue Lushndrea. Pt in route from [facility].

## 2016-03-12 ENCOUNTER — Encounter (HOSPITAL_COMMUNITY): Payer: Self-pay | Admitting: Internal Medicine

## 2016-03-12 ENCOUNTER — Inpatient Hospital Stay (HOSPITAL_COMMUNITY): Payer: Medicare Other

## 2016-03-12 DIAGNOSIS — J9601 Acute respiratory failure with hypoxia: Secondary | ICD-10-CM | POA: Diagnosis present

## 2016-03-12 DIAGNOSIS — N390 Urinary tract infection, site not specified: Secondary | ICD-10-CM

## 2016-03-12 DIAGNOSIS — F0391 Unspecified dementia with behavioral disturbance: Secondary | ICD-10-CM

## 2016-03-12 DIAGNOSIS — G9340 Encephalopathy, unspecified: Secondary | ICD-10-CM | POA: Diagnosis present

## 2016-03-12 LAB — BRAIN NATRIURETIC PEPTIDE: B Natriuretic Peptide: 275 pg/mL — ABNORMAL HIGH (ref 0.0–100.0)

## 2016-03-12 LAB — CBC WITH DIFFERENTIAL/PLATELET
Basophils Absolute: 0 10*3/uL (ref 0.0–0.1)
Basophils Relative: 0 %
EOS ABS: 0 10*3/uL (ref 0.0–0.7)
EOS PCT: 0 %
HCT: 37.2 % (ref 36.0–46.0)
HEMOGLOBIN: 11.8 g/dL — AB (ref 12.0–15.0)
LYMPHS ABS: 0.9 10*3/uL (ref 0.7–4.0)
Lymphocytes Relative: 11 %
MCH: 30.3 pg (ref 26.0–34.0)
MCHC: 31.7 g/dL (ref 30.0–36.0)
MCV: 95.6 fL (ref 78.0–100.0)
MONOS PCT: 11 %
Monocytes Absolute: 0.9 10*3/uL (ref 0.1–1.0)
Neutro Abs: 6.3 10*3/uL (ref 1.7–7.7)
Neutrophils Relative %: 78 %
PLATELETS: 211 10*3/uL (ref 150–400)
RBC: 3.89 MIL/uL (ref 3.87–5.11)
RDW: 13.8 % (ref 11.5–15.5)
WBC: 8.1 10*3/uL (ref 4.0–10.5)

## 2016-03-12 LAB — COMPREHENSIVE METABOLIC PANEL
ALT: 10 U/L — AB (ref 14–54)
AST: 16 U/L (ref 15–41)
Albumin: 2.7 g/dL — ABNORMAL LOW (ref 3.5–5.0)
Alkaline Phosphatase: 51 U/L (ref 38–126)
Anion gap: 12 (ref 5–15)
BUN: 10 mg/dL (ref 6–20)
CHLORIDE: 104 mmol/L (ref 101–111)
CO2: 25 mmol/L (ref 22–32)
Calcium: 8.9 mg/dL (ref 8.9–10.3)
Creatinine, Ser: 0.83 mg/dL (ref 0.44–1.00)
GFR calc Af Amer: >60 mL/min (ref >60)
GFR calc non Af Amer: >60 mL/min (ref >60)
Glucose, Bld: 109 mg/dL — ABNORMAL HIGH (ref 65–99)
Potassium: 3.9 mmol/L (ref 3.5–5.1)
SODIUM: 141 mmol/L (ref 135–145)
Total Bilirubin: 1 mg/dL (ref 0.3–1.2)
Total Protein: 6.2 g/dL — ABNORMAL LOW (ref 6.5–8.1)

## 2016-03-12 LAB — GLUCOSE, CAPILLARY: Glucose-Capillary: 106 mg/dL — ABNORMAL HIGH (ref 65–99)

## 2016-03-12 LAB — TSH: TSH: 2.3 u[IU]/mL (ref 0.350–4.500)

## 2016-03-12 LAB — MRSA PCR SCREENING: MRSA by PCR: NEGATIVE

## 2016-03-12 LAB — D-DIMER, QUANTITATIVE (NOT AT ARMC): D DIMER QUANT: 2.91 ug{FEU}/mL — AB (ref 0.00–0.50)

## 2016-03-12 LAB — AMMONIA: AMMONIA: 19 umol/L (ref 9–35)

## 2016-03-12 MED ORDER — SODIUM CHLORIDE 0.9 % IV SOLN: INTRAVENOUS | Status: DC

## 2016-03-12 MED ORDER — LEVALBUTEROL HCL 0.63 MG/3ML IN NEBU
0.6300 mg | INHALATION_SOLUTION | Freq: Three times a day (TID) | RESPIRATORY_TRACT | Status: DC
  Administered 2016-03-12 – 2016-03-13 (×5): 0.63 mg via RESPIRATORY_TRACT
  Filled 2016-03-12 (×5): qty 3

## 2016-03-12 MED ORDER — GUAIFENESIN 100 MG/5ML PO SOLN: 200.0000 mg | Freq: Four times a day (QID) | ORAL | Status: DC | PRN

## 2016-03-12 MED ORDER — ALPRAZOLAM 0.25 MG PO TABS
0.2500 mg | ORAL_TABLET | Freq: Once | ORAL | Status: AC
  Administered 2016-03-12: 0.25 mg via ORAL
  Filled 2016-03-12: qty 1

## 2016-03-12 MED ORDER — ASPIRIN 325 MG PO TABS
325.0000 mg | ORAL_TABLET | Freq: Every day | ORAL | Status: DC
  Administered 2016-03-12 – 2016-03-13 (×2): 325 mg via ORAL
  Filled 2016-03-12 (×2): qty 1

## 2016-03-12 MED ORDER — PANTOPRAZOLE SODIUM 40 MG PO TBEC
40.0000 mg | DELAYED_RELEASE_TABLET | Freq: Every day | ORAL | Status: DC
  Administered 2016-03-12 – 2016-03-13 (×2): 40 mg via ORAL
  Filled 2016-03-12 (×2): qty 1

## 2016-03-12 MED ORDER — METOPROLOL TARTRATE 50 MG PO TABS
50.0000 mg | ORAL_TABLET | Freq: Two times a day (BID) | ORAL | Status: DC
  Administered 2016-03-12 – 2016-03-13 (×3): 50 mg via ORAL
  Filled 2016-03-12 (×3): qty 1

## 2016-03-12 MED ORDER — DEXTROSE 5 % IV SOLN
1.0000 g | INTRAVENOUS | Status: DC
  Administered 2016-03-12 – 2016-03-13 (×2): 1 g via INTRAVENOUS
  Filled 2016-03-12 (×2): qty 10

## 2016-03-12 MED ORDER — PAROXETINE HCL 20 MG PO TABS
10.0000 mg | ORAL_TABLET | Freq: Every day | ORAL | Status: DC
  Administered 2016-03-12 – 2016-03-13 (×2): 10 mg via ORAL
  Filled 2016-03-12 (×2): qty 1

## 2016-03-12 MED ORDER — IOPAMIDOL (ISOVUE-370) INJECTION 76%
INTRAVENOUS | Status: AC
  Administered 2016-03-12: 100 mL
  Filled 2016-03-12: qty 100

## 2016-03-12 MED ORDER — ENOXAPARIN SODIUM 40 MG/0.4ML ~~LOC~~ SOLN
40.0000 mg | SUBCUTANEOUS | Status: DC
  Administered 2016-03-12 – 2016-03-13 (×2): 40 mg via SUBCUTANEOUS
  Filled 2016-03-12 (×2): qty 0.4

## 2016-03-12 MED ORDER — DOCUSATE SODIUM 100 MG PO CAPS
100.0000 mg | ORAL_CAPSULE | Freq: Every day | ORAL | Status: DC
  Administered 2016-03-12 – 2016-03-13 (×2): 100 mg via ORAL
  Filled 2016-03-12 (×2): qty 1

## 2016-03-12 MED ORDER — CETYLPYRIDINIUM CHLORIDE 0.05 % MT LIQD
7.0000 mL | Freq: Two times a day (BID) | OROMUCOSAL | Status: DC
  Administered 2016-03-12 – 2016-03-13 (×3): 7 mL via OROMUCOSAL

## 2016-03-12 NOTE — H&P (Signed)
History and Physical    HAASINI PATNAUDE WUJ:[id number] DOB: 12-19-1927 DOA: 03/11/2016  PCP: Elizabeth Palau, [name]  Patient coming from: Assisted living.  History obtained from patient's son. Reviewed old records.  Chief Complaint: Altered mental status.  HPI: Brooke Phelps is a 82 y.o. female with medical history significant of dementia, atrial flutter, questionable history of COPD and interstitial lung disease was brought from assisted living after patient's family found that patient was getting increasingly lethargic over the last 24 hours. As per the patient's family there was no recent change in her medications. Patient did not have any nausea vomiting or diarrhea or did not have any productive cough fever or chills. Did not complain of any chest pain or shortness of breath. In the ER patient was found to be hypoxic and required nasal cannula and urine analysis showed features consistent with UTI. Chest x-ray shows chronic bronchitis changes. Patient on my exam was sleeping but easily arousable and is oriented to name and follows commands and moves all extremities. Appears mildly lethargic.   ED Course: Patient was placed on IV fluids and ceftriaxone. CT of the head was unremarkable.  Review of Systems: As per HPI otherwise 10 point review of systems negative.    Past Medical History  Diagnosis Date  . COPD (chronic obstructive pulmonary disease) (HCC)   . Dementia   . Atrial fibrillation [facility])     Past Surgical History  Procedure Laterality Date  . Orif ankle fracture Left 11/11/2013    Procedure: OPEN REDUCTION INTERNAL FIXATION (ORIF) ANKLE FRACTURE;  Surgeon: Nadara Mustard, MD;  Location: [facility] OR;  Service: Orthopedics;  Laterality: Left;     reports that she has never smoked. She does not have any smokeless tobacco history on file. She reports that she does not drink alcohol or use illicit drugs.  No Known Allergies  Family History  Problem Relation Age of Onset  .  Dementia Sister   . Diabetes Mellitus II Neg Hx   . Hypertension Neg Hx     Prior to Admission medications   Medication Sig Start Date End Date Taking? Authorizing Provider  acetaminophen (TYLENOL) 500 MG tablet Take 2 tablets (1,000 mg total) by mouth 3 (three) times daily. 11/14/13  Yes Shanker Levora Dredge, MD  ALPRAZolam Prudy Feeler) 0.5 MG tablet Take 1 tablet (0.5 mg total) by mouth every 8 (eight) hours as needed for anxiety. Patient taking differently: Take 0.5 mg by mouth 2 (two) times daily.  11/14/13  Yes Shanker Levora Dredge, MD  aspirin 325 MG tablet Take 1 tablet (325 mg total) by mouth daily. 11/14/13  Yes Shanker Levora Dredge, MD  docusate sodium 100 MG CAPS Take 100 mg by mouth 2 (two) times daily. Patient taking differently: Take 100 mg by mouth daily.  11/14/13  Yes Shanker Levora Dredge, MD  levalbuterol (XOPENEX) 0.63 MG/3ML nebulizer solution Take 3 mLs (0.63 mg total) by nebulization 3 (three) times daily. 11/14/13  Yes Shanker Levora Dredge, MD  metoprolol (LOPRESSOR) 50 MG tablet Take 1 tablet (50 mg total) by mouth 2 (two) times daily. 11/14/13  Yes Shanker Levora Dredge, MD  omeprazole (PRILOSEC) 20 MG capsule Take 20 mg by mouth every morning.   Yes Historical Provider, MD  PARoxetine (PAXIL) 10 MG tablet Take 1 tablet (10 mg total) by mouth daily. 11/14/13  Yes Shanker Levora Dredge, MD  alum & mag hydroxide-simeth (MINTOX) 200-200-20 MG/5ML suspension Take 30 mLs by mouth every 6 (six) hours as needed for  indigestion or heartburn.    Historical Provider, MD  guaifenesin (ROBITUSSIN) 100 MG/5ML syrup Take 200 mg by mouth 4 (four) times daily as needed for cough.    Historical Provider, MD  loperamide (IMODIUM) 2 MG capsule Take 2 mg by mouth as needed for diarrhea or loose stools.    Historical Provider, MD  magnesium hydroxide (MILK OF MAGNESIA) 400 MG/5ML suspension Take 30 mLs by mouth daily as needed for mild constipation.    Historical Provider, MD  Neomycin-Bacitracin-Polymyxin (TRIPLE ANTIBIOTIC)  [phone] OINT Apply 1 application topically as needed.    Historical Provider, MD    Physical Exam: Filed Vitals:   03/11/16 1715 03/11/16 1730 03/11/16 1921 03/11/16 2124  BP:  122/90 157/57 146/57  Pulse: 69 72 75 75  Temp:   100.2 F (37.9 C) 100 F (37.8 C)  TempSrc:   Oral Oral  Resp: 15 20    SpO2: 91% 99% 94% 90%      Constitutional: Not in distress. Filed Vitals:   03/11/16 1715 03/11/16 1730 03/11/16 1921 03/11/16 2124  BP:  122/90 157/57 146/57  Pulse: 69 72 75 75  Temp:   100.2 F (37.9 C) 100 F (37.8 C)  TempSrc:   Oral Oral  Resp: 15 20    SpO2: 91% 99% 94% 90%   Eyes: Anicteric no pallor. ENMT: No discharge from ears eyes nose or mouth. Neck: No mass felt. No JVD appreciated. Respiratory: No rhonchi or crepitations. Cardiovascular: S1 and S2 heard. Abdomen: Soft nontender bowel sounds present. Musculoskeletal: No edema. Skin: No rash. Neurologic: Appears drowsy but oriented to name and follows commands and moves all extremities. Psychiatric: Oriented to her name.   Labs on Admission: I have personally reviewed following labs and imaging studies  CBC:  Recent Labs Lab 03/11/16 1120  WBC 9.2  NEUTROABS 7.4  HGB 13.3  HCT 40.8  MCV 95.1  PLT 208   Basic Metabolic Panel:  Recent Labs Lab 03/11/16 1120  NA 133*  K 3.7  CL 101  CO2 27  GLUCOSE 188*  BUN 14  CREATININE 0.98  CALCIUM 8.5*   GFR: CrCl cannot be calculated (Unknown ideal weight.). Liver Function Tests:  Recent Labs Lab 03/11/16 1120  AST 19  ALT 10*  ALKPHOS 59  BILITOT 0.9  PROT 7.1  ALBUMIN 3.5    Recent Labs Lab 03/11/16 1120  LIPASE 18   No results for input(s): AMMONIA in the last 168 hours. Coagulation Profile: No results for input(s): INR, PROTIME in the last 168 hours. Cardiac Enzymes: No results for input(s): CKTOTAL, CKMB, CKMBINDEX, TROPONINI in the last 168 hours. BNP (last 3 results) No results for input(s): PROBNP in the last 8760  hours. HbA1C: No results for input(s): HGBA1C in the last 72 hours. CBG: No results for input(s): GLUCAP in the last 168 hours. Lipid Profile: No results for input(s): CHOL, HDL, LDLCALC, TRIG, CHOLHDL, LDLDIRECT in the last 72 hours. Thyroid Function Tests: No results for input(s): TSH, T4TOTAL, FREET4, T3FREE, THYROIDAB in the last 72 hours. Anemia Panel: No results for input(s): VITAMINB12, FOLATE, FERRITIN, TIBC, IRON, RETICCTPCT in the last 72 hours. Urine analysis:    Component Value Date/Time   COLORURINE AMBER* 03/11/2016 1120   APPEARANCEUR TURBID* 03/11/2016 1120   LABSPEC 1.019 03/11/2016 1120   PHURINE 5.5 03/11/2016 1120   GLUCOSEU NEGATIVE 03/11/2016 1120   HGBUR MODERATE* 03/11/2016 1120   BILIRUBINUR SMALL* 03/11/2016 1120   KETONESUR 15* 03/11/2016 1120   PROTEINUR 100* 03/11/2016  1120   UROBILINOGEN 1.0 11/10/2013 2130   NITRITE POSITIVE* 03/11/2016 1120   LEUKOCYTESUR LARGE* 03/11/2016 1120   Sepsis Labs: @LABRCNTIP (procalcitonin:4,lacticidven:4) )No results found for this or any previous visit (from the past 240 hour(s)).   Radiological Exams on Admission: Dg Chest 2 View  03/11/2016  CLINICAL DATA:  Altered mental status with hypoxia EXAM: CHEST  2 VIEW COMPARISON:  02/28/2015 FINDINGS: Chronic bronchitic markings. Low volume chest without consolidation or edema. Calcified granuloma over the right chest. Normal heart size and stable mediastinal contours allowing for distortion by rightward rotation. IMPRESSION: Chronic bronchitic markings.  No evidence of acute disease. Electronically Signed   By: Marnee SpringJonathon  Watts M.D.   On: 03/11/2016 12:48   Ct Head Wo Contrast  03/11/2016  CLINICAL DATA:  Altered mental status. EXAM: CT HEAD WITHOUT CONTRAST TECHNIQUE: Contiguous axial images were obtained from the base of the skull through the vertex without intravenous contrast. COMPARISON:  February 28, 2015. FINDINGS: Bony calvarium appears intact. Mild diffuse cortical  atrophy is noted. Mild chronic ischemic white matter disease is noted. No mass effect or midline shift is noted. Ventricular size is within normal limits. There is no evidence of mass lesion, hemorrhage or acute infarction. IMPRESSION: Mild diffuse cortical atrophy. Mild chronic ischemic white matter disease. No acute intracranial abnormality seen. Electronically Signed   By: Lupita RaiderJames  Green Jr, M.D.   On: 03/11/2016 12:55    EKG: Independently reviewed. Normal sinus rhythm.  Assessment/Plan Principal Problem:   Acute encephalopathy Active Problems:   Hypoxia   Dementia with behavioral disturbance   Atrial flutter (HCC)   UTI (lower urinary tract infection)   Acute respiratory failure with hypoxia (HCC)    #1. Acute encephalopathy - probably multifactorial including secondary to UTI and hypoxia. Patient is also on Xanax scheduled dose which I will place on hold since patient is lethargic. May restart Xanax on a when necessary basis. Patient will be on ceftriaxone for UTI. And closely monitor for any further worsening of hypoxia. Will check ammonia levels and if mental status does not improve then may consider further tests including MRI. #2. Acute hypoxic respiratory failure - patient is requiring oxygen to maintain sats. Reviewing patient's old charts patient did have hypoxia previously and was considered to have possible interstitial lung disease/COPD. Chest x-ray today shows bronchitis. Check d-dimer and BNP levels. Patient's 2-D echo done in 2015 showed EF of 45-50% with grade 1 diastolic dysfunction. #3. History of atrial flutter presently in sinus rhythm was felt to be not a candidate for anticoagulation secondary to risk of falls and dementia. Chads 2 vasc score is more than 2. Continue metoprolol. #4. Dementia - not on any medications at this time.   DVT prophylaxis: Lovenox. Code Status: DO NOT RESUSCITATE.  Family Communication: Patient's son.  Disposition Plan: Assisted living.    Consults called: Physical therapy.  Admission status: Inpatient. Telemetry. Likely stay 2 days.    Eduard ClosKAKRAKANDY,Raneshia Derick N. MD [facility] Pager [phone].  If 7PM-7AM, please contact night-coverage [email or web address] Password [email or web address]  03/12/2016, 1:47 AM

## 2016-03-12 NOTE — Progress Notes (Signed)
Paged Dr. Mahala MenghiniSamtani for diet order.

## 2016-03-12 NOTE — Care Management Note (Signed)
Case Management Note  Patient Details  Name: Brooke Phelps MRN: [id number] Date of Birth: 1928/05/05  Subjective/Objective:                 Spoke with patient and daughter in law in the room. Patient is still confused today and being treated for UTI with IV Abx. She is not at baseline per DIL report. [facility]. DIL states she receives very little assistance there. Patient has had home oxygen in the past -about 3 years ago- but refused to wear it and so the family returned it. Currently patient is on oxygen while hospitalized. Ra sat in 80's in ED. [facility] and do not contract to anyone specifically for DME.   Action/Plan:  Anticipate HH with [facility] and home oxygen or SNF at DC.  Expected Discharge Date:                  Expected Discharge Plan:  Assisted Living / [facility], not memory care side)  In-House Referral:  Clinical Social Work  Discharge planning Services  CM Consult  Post Acute Care Choice:    Choice offered to:     DME Arranged:    DME Agency:     HH Arranged:    HH Agency:     Status of Service:  In process, will continue to follow  Medicare Important Message Given:    Date Medicare IM Given:    Medicare IM give by:    Date Additional Medicare IM Given:    Additional Medicare Important Message give by:     If discussed at Long Length of Stay Meetings, dates discussed:    Additional Comments:  Lawerance SabalDebbie Chella Chapdelaine, RN 03/12/2016, 2:12 PM

## 2016-03-12 NOTE — Progress Notes (Signed)
Pharmacy Antibiotic Note  Brooke Phelps is a 82 y.o. female admitted on 03/11/2016 with UTI.  Pharmacy has been consulted for Ceftriaxone dosing.  Plan: -Ceftriaxone 1g IV q24h -F/U urine culture for directed therapy  Temp (24hrs), Avg:99.5 F (37.5 C), Min:98.7 F (37.1 C), Max:100.2 F (37.9 C)   Recent Labs Lab 03/11/16 1120  WBC 9.2  CREATININE 0.98    CrCl cannot be calculated (Unknown ideal weight.).    No Known Allergies    Abran DukeLedford, Naftula Donahue 03/12/2016 2:00 AM

## 2016-03-12 NOTE — Progress Notes (Signed)
1:19 PM I agree with HPI/GPe and A/P per Dr. Toniann FailKakrakandy    82 y/o ? Dementia L ankle # 2015   P AFib not on anticoagulation secondary to risk for fall and dementia  History is provided by daughter-in-law who states that the patient has been living at her assisted living facility and has had intermittent confusion over the past 48 hours usually she is able to verbalize well and do things without any acute issue but daughter-in-law noticed that patient with past 48 hours has been less responsive and more lethargic  Patient found to have urine analysis being dirty Gram-negative rods growing In addition d-dimer is elevated  Pleasantly confused EOMI NCAT No nausea Abdomen soft nontender nondistended Telemetry benign appearing  Patient Active Problem List   Diagnosis Date Noted  . Acute encephalopathy 03/12/2016  . UTI (lower urinary tract infection) 03/12/2016  . Acute respiratory failure with hypoxia (HCC) 03/12/2016  . Altered mental status 03/11/2016  . Atrial flutter (HCC) 11/12/2013  . Hypoxia 11/11/2013  . Dementia with behavioral disturbance 11/11/2013  . ILD (interstitial lung disease) (HCC) 11/11/2013  . Bimalleolar ankle fracture 11/10/2013   D-dimer 2.9-reasonable to rule out hypoxic cause with CTs chest Continue ceftriaxone for gram-negative rods in urine I will update family further in a.m. Patient may need skilled versus memory level care dependent on severity of dementia

## 2016-03-13 LAB — GLUCOSE, CAPILLARY
Glucose-Capillary: 90 mg/dL (ref 65–99)
Glucose-Capillary: 91 mg/dL (ref 65–99)
Glucose-Capillary: 92 mg/dL (ref 65–99)

## 2016-03-13 MED ORDER — NITROFURANTOIN MONOHYD MACRO 100 MG PO CAPS: 100.0000 mg | ORAL_CAPSULE | Freq: Two times a day (BID) | ORAL | Status: DC

## 2016-03-13 MED ORDER — NITROFURANTOIN MONOHYD MACRO 100 MG PO CAPS
100.0000 mg | ORAL_CAPSULE | Freq: Two times a day (BID) | ORAL | Status: DC
  Filled 2016-03-13 (×2): qty 1

## 2016-03-13 MED ORDER — FUROSEMIDE 20 MG PO TABS: 20.0000 mg | ORAL_TABLET | ORAL | Status: DC

## 2016-03-13 NOTE — Clinical Social Work Note (Signed)
Clinical Social Work Assessment  Patient Details  Name: Brooke Phelps MRN: [id number] Date of Birth: 01-29-1928  Date of referral:  03/13/16               Reason for consult:  Facility Placement                Permission sought to share information with:  Facility Medical sales representativeContact Representative, Family Supports Permission granted to share information::  Yes, Verbal Permission Granted  Name::     Geophysicist/field seismologistTerry  Agency::  [facility]  Relationship::  Son  Contact Information:  [phone]  Housing/Transportation Living arrangements for the past 2 months:  Assisted Living Facility Source of Information:  Adult Children Patient Interpreter Needed:  None Criminal Activity/Legal Involvement Pertinent to Current Situation/Hospitalization:  No - Comment as needed Significant Relationships:  Adult Children Lives with:  Self Do you feel safe going back to the place where you live?  Yes Need for family participation in patient care:  Yes (Comment)  Care giving concerns:  CSW received referral for [facility]. Patient is disoriented. CSW spoke with patient's son, Brooke Phelps. [facility].   Social Worker assessment / plan:  Patient will return to [facility] at discharge.  Employment status:  Retired Health and safety inspectornsurance information:  Medicare PT Recommendations:  Not assessed at this time Information / Referral to community resources:     Patient/Family's Response to care:  Son expressed agreement with plan and hopes patient can leave hospital soon.  Patient/Family's Understanding of and Emotional Response to Diagnosis, Current Treatment, and Prognosis:  No questions/concerns.  Emotional Assessment Appearance:  Appears stated age Attitude/Demeanor/Rapport:  Unable to Assess Affect (typically observed):  Unable to Assess Orientation:  Oriented to Self Alcohol / Substance use:  Not Applicable Psych involvement (Current and /or in the community):  No  (Comment)  Discharge Needs  Concerns to be addressed:  Care Coordination Readmission within the last 30 days:  No Current discharge risk:  None Barriers to Discharge:  Continued Medical Work up   Brooke Phelps, LCSWA 03/13/2016, 9:15 AM

## 2016-03-13 NOTE — Progress Notes (Signed)
[facility] will [facility] to: [facility] Anticipated DC date: 03/13/16 Family notified: Daughter Transport ZO:XWRUEAVWby:Daughter by car   Per MD patient ready for DC to ALF. RN, patient, patient's family, and facility notified of DC. RN given number for report.   CSW signing off.  Brooke GoldmannNadia Atiba Phelps, [place]WA Clinical Social Worker [phone]

## 2016-03-13 NOTE — Progress Notes (Signed)
[facility] via daughter. Discharge instructions given to daughter. And all questions answered.

## 2016-03-13 NOTE — [place] FL2 (Signed)
[place] MEDICA[place] FL2 LEVEL OF CARE SCREENING TOOL     IDENTIFICATION  Patient Name: Brooke Phelps Birthdate: [birth year] Sex: female Admission Date (Current Location): 03/11/2016  [facility] [place]   Facility and Address:  [facility], [address], [place], [place] [address]      Provider Number: [id number]  Attending Physician Name and Address:  Rhetta MuraJai-Gurmukh Samtani, MD  Relative Name and Phone Number:  [name], son, [phone]    Current Level of Care: Hospital Recommended Level of Care: Assisted Living Facility Prior Approval Number:    Date Approved/Denied:   PASRR Number:    Discharge Plan: Other (Comment) (ALF)    Current Diagnoses: Patient Active Problem List   Diagnosis Date Noted  . Acute encephalopathy 03/12/2016  . UTI (lower urinary tract infection) 03/12/2016  . Acute respiratory failure with hypoxia (HCC) 03/12/2016  . Altered mental status 03/11/2016  . Atrial flutter (HCC) 11/12/2013  . Hypoxia 11/11/2013  . Dementia with behavioral disturbance 11/11/2013  . ILD (interstitial lung disease) (HCC) 11/11/2013  . Bimalleolar ankle fracture 11/10/2013    Orientation RESPIRATION BLADDER Height & Weight     Self  O2 (Nasal Cannula 2L) Continent Weight: 68 kg (149 lb 14.6 oz) Height:  5\' 4"  (162.6 cm)  BEHAVIORAL SYMPTOMS/MOOD NEUROLOGICAL BOWEL NUTRITION STATUS   (N/A)   Continent Diet (Regular-Cardiac)  AMBULATORY STATUS COMMUNICATION OF NEEDS Skin   Limited Assist Verbally Normal                       Personal Care Assistance Level of Assistance  Bathing, Feeding, Dressing Bathing Assistance: Limited assistance Feeding assistance: Independent Dressing Assistance: Limited assistance     Functional Limitations Info             SPECIAL CARE FACTORS FREQUENCY                       Contractures      Additional Factors Info  Code Status, Allergies Code Status Info:  DNR Allergies Info: NKA           Current Medications (03/13/2016):    Discharge Medications: START taking these medications   Details  furosemide (LASIX) 20 MG tablet Take 1 tablet (20 mg total) by mouth every other day. Qty: 30 tablet, Refills: 0    nitrofurantoin, macrocrystal-monohydrate, (MACROBID) 100 MG capsule Take 1 capsule (100 mg total) by mouth every 12 (twelve) hours. Qty: 4 capsule, Refills: 0      CONTINUE these medications which have NOT CHANGED   Details  acetaminophen (TYLENOL) 500 MG tablet Take 2 tablets (1,000 mg total) by mouth 3 (three) times daily. Qty: 30 tablet, Refills: 0    ALPRAZolam (XANAX) 0.5 MG tablet Take 1 tablet (0.5 mg total) by mouth every 8 (eight) hours as needed for anxiety. Qty: 20 tablet, Refills: 0    aspirin 325 MG tablet Take 1 tablet (325 mg total) by mouth daily. Qty: 30 tablet, Refills: 0    docusate sodium 100 MG CAPS Take 100 mg by mouth 2 (two) times daily. Qty: 10 capsule, Refills: 0    levalbuterol (XOPENEX) 0.63 MG/3ML nebulizer solution Take 3 mLs (0.63 mg total) by nebulization 3 (three) times daily. Qty: 3 mL, Refills: 12    metoprolol (LOPRESSOR) 50 MG tablet Take 1 tablet (50 mg total) by mouth 2 (two) times daily.    omeprazole (PRILOSEC) 20 MG capsule Take 20  mg by mouth daily.    PARoxetine (PAXIL) 10 MG tablet Take 1 tablet (10 mg total) by mouth daily. Qty: 30 tablet, Refills: 0    alum & mag hydroxide-simeth (MINTOX) 200-200-20 MG/5ML suspension Take 30 mLs by mouth every 6 (six) hours as needed for indigestion or heartburn.    guaifenesin (ROBITUSSIN) 100 MG/5ML syrup Take 200 mg by mouth 4 (four) times daily as needed for cough.    loperamide (IMODIUM) 2 MG capsule Take 2 mg by mouth as needed for diarrhea or loose stools.    magnesium hydroxide (MILK OF MAGNESIA) 400 MG/5ML suspension Take 30 mLs by mouth daily as needed for mild constipation.     Neomycin-Bacitracin-Polymyxin (TRIPLE ANTIBIOTIC) [phone] OINT Apply 1 application topically as needed.         Relevant Imaging Results:  Relevant Lab Results:   Additional Information SSN: [id number]  Mearl Latin, LCSWA

## 2016-03-13 NOTE — Progress Notes (Deleted)
SATURATION QUALIFICATIONS: (This note is used to comply with regulatory documentation for home oxygen)  Patient Saturations on Room Air at Rest = 91%  Patient Saturations on Room Air while Ambulating = 88%  Patient Saturations on 2 Liters of oxygen while Ambulating = 95%

## 2016-03-13 NOTE — Evaluation (Signed)
Physical Therapy Evaluation Patient Details Name: Brooke Phelps MRN: [id number] DOB: 1928/01/23 Today's Date: 03/13/2016   History of Present Illness  Pt adm with acute encephalopathy and found to have UTI. PMH - dementia, ankle fx, COPD  Clinical Impression  Pt presents to PT with some decline in mobility. Likely primarily due to unfamiliar environment and inactivity over the past several days. Feel pt will likely return most quickly to baseline by returning to her familiar ALF.     Follow Up Recommendations No PT follow up    Equipment Recommendations  None recommended by PT    Recommendations for Other Services       Precautions / Restrictions Precautions Precautions: Fall      Mobility  Bed Mobility Overal bed mobility: Needs Assistance Bed Mobility: Sit to Supine       Sit to supine: Min assist   General bed mobility comments: assist to bring feet up  Transfers Overall transfer level: Needs assistance Equipment used: 1 person hand held assist;None Transfers: Sit to/from Stand Sit to Stand: Min assist         General transfer comment: Assist to initiate and for balance  Ambulation/Gait Ambulation/Gait assistance: Min assist Ambulation Distance (Feet): 170 Feet Assistive device: Rolling walker (2 wheeled);1 person hand held assist;2 person hand held assist Gait Pattern/deviations: Step-through pattern;Decreased step length - right;Decreased step length - left;Trunk flexed     General Gait Details: Assist for balance and support. Pt with tendency to push walker too far in front.   Stairs            Wheelchair Mobility    Modified Rankin (Stroke Patients Only)       Balance Overall balance assessment: Needs assistance Sitting-balance support: No upper extremity supported;Feet supported Sitting balance-Leahy Scale: Good     Standing balance support: Single extremity supported Standing balance-Leahy Scale: Poor Standing balance comment: UE  support                             Pertinent Vitals/Pain Pain Assessment: No/denies pain    Home Living Family/patient expects to be discharged to:: Assisted living               Home Equipment: Walker - 2 wheels      Prior Function Level of Independence: Needs assistance   Gait / Transfers Assistance Needed: amb usually using hand rail in halls           Hand Dominance        Extremity/Trunk Assessment   Upper Extremity Assessment: Generalized weakness           Lower Extremity Assessment: Generalized weakness         Communication      Cognition Arousal/Alertness: Awake/alert Behavior During Therapy: Anxious Overall Cognitive Status: History of cognitive impairments - at baseline                      General Comments      Exercises        Assessment/Plan    PT Assessment Patent does not need any further PT services  PT Diagnosis Difficulty walking;Generalized weakness   PT Problem List    PT Treatment Interventions     PT Goals (Current goals can be found in the Care Plan section) Acute Rehab PT Goals PT Goal Formulation: All assessment and education complete, DC therapy    Frequency  Barriers to discharge        Co-evaluation               End of Session Equipment Utilized During Treatment: Gait belt Activity Tolerance: Patient tolerated treatment well Patient left: in bed;with call bell/phone within reach;with bed alarm set;with family/visitor present Nurse Communication: Mobility status         Time: 1610-9604 PT Time Calculation (min) (ACUTE ONLY): 18 min   Charges:   PT Evaluation $PT Eval Low Complexity: 1 Procedure     PT G Codes:        Chasey Dull 04/01/2016, 3:34 PM [facility] PT [phone]

## 2016-03-13 NOTE — Discharge Summary (Addendum)
Physician Discharge Summary  Brooke Phelps ZOX:[id number] DOB: 1927/12/10 DOA: 03/11/2016  PCP: Elizabeth Palau, [name]  Admit date: 03/11/2016 Discharge date: 03/13/2016  Time spent: 35 minutes  Recommendations for Outpatient Follow-up:  1. Complete Macrodantin 1 tablet twice a day on 03/15/2016 2. Patient stable to return to current level of care at [facility] house 3. Recommend CBC plus differential as well as basic metabolic panel 1 week 4. Daily weight at the facility 5. She should be on chr oxygen 2l at night 2l during the day and continuously as CT scan showed interstitial fibrosis which may warrant workup as an outpatient with pulmonology 6. This admission and started Lasix 20 mg every other day as felt to have a little bit of crackles in the lungs-unclear if this was COPD or CHF  Discharge Diagnoses:  Principal Problem:   Acute encephalopathy Active Problems:   Hypoxia   Dementia with behavioral disturbance   Atrial flutter (HCC)   UTI (lower urinary tract infection)   Acute respiratory failure with hypoxia [facility])   Discharge Condition: stable and improved  Diet recommendation: regular  Filed Weights   03/13/16 1400  Weight: 68 kg (149 lb 14.6 oz)    History of present illness:   82 y/o ? Dementia L ankle # 2015              P AFib not on anticoagulation secondary to risk for fall and dementia  History is provided by daughter-in-law who states that the patient has been living at her assisted living facility and has had intermittent confusion over the past 48 hours usually she is able to verbalize well   Patient was admitted with toxic metabolic encephalopathy secondary to pyelonephritis from Escherichia coli She has underlying dementia She was more independent with therapy working with her on 03/13/2016 she had some mild low-grade temperatures less than 100 and she had some crackles in her lungs We started Lasix 20 every other day and patient is to have herself  weighed every other day and probably follow-up as an outpatient regarding? Need for echocardiogram if felt necessary by primary provider  Her Escherichia coli was pansensitive and she was placed on Macrodantin 100 twice a day until 03/15/2016 as the sensitivities and culture based on antibiogram in this region showed the best response with that  Patient was clinically stable for discharge on 03/13/2016 I alerted social worker and family regarding discharge  Discharge Exam: Ceasar Mons Vitals:   03/13/16 0943 03/13/16 1449  BP: 128/68   Pulse:    Temp:  98 F (36.7 C)  Resp:      General: EOMI NCAT Cardiovascular: S1-S2 no murmur rub or gallop Respiratory: Slight crackles in the posterior bases of the lungs  Discharge Instructions   Discharge Instructions    Discharge instructions    Complete by:  As directed   Complete Macriobid twice a day on 03/15/16     Increase activity slowly    Complete by:  As directed           Current Discharge Medication List    START taking these medications   Details  furosemide (LASIX) 20 MG tablet Take 1 tablet (20 mg total) by mouth every other day. Qty: 30 tablet, Refills: 0    nitrofurantoin, macrocrystal-monohydrate, (MACROBID) 100 MG capsule Take 1 capsule (100 mg total) by mouth every 12 (twelve) hours. Qty: 4 capsule, Refills: 0      CONTINUE these medications which have NOT CHANGED   Details  acetaminophen (  TYLENOL) 500 MG tablet Take 2 tablets (1,000 mg total) by mouth 3 (three) times daily. Qty: 30 tablet, Refills: 0    ALPRAZolam (XANAX) 0.5 MG tablet Take 1 tablet (0.5 mg total) by mouth every 8 (eight) hours as needed for anxiety. Qty: 20 tablet, Refills: 0    aspirin 325 MG tablet Take 1 tablet (325 mg total) by mouth daily. Qty: 30 tablet, Refills: 0    docusate sodium 100 MG CAPS Take 100 mg by mouth 2 (two) times daily. Qty: 10 capsule, Refills: 0    levalbuterol (XOPENEX) 0.63 MG/3ML nebulizer solution Take 3 mLs (0.63  mg total) by nebulization 3 (three) times daily. Qty: 3 mL, Refills: 12    metoprolol (LOPRESSOR) 50 MG tablet Take 1 tablet (50 mg total) by mouth 2 (two) times daily.    omeprazole (PRILOSEC) 20 MG capsule Take 20 mg by mouth daily.    PARoxetine (PAXIL) 10 MG tablet Take 1 tablet (10 mg total) by mouth daily. Qty: 30 tablet, Refills: 0    alum & mag hydroxide-simeth (MINTOX) 200-200-20 MG/5ML suspension Take 30 mLs by mouth every 6 (six) hours as needed for indigestion or heartburn.    guaifenesin (ROBITUSSIN) 100 MG/5ML syrup Take 200 mg by mouth 4 (four) times daily as needed for cough.    loperamide (IMODIUM) 2 MG capsule Take 2 mg by mouth as needed for diarrhea or loose stools.    magnesium hydroxide (MILK OF MAGNESIA) 400 MG/5ML suspension Take 30 mLs by mouth daily as needed for mild constipation.    Neomycin-Bacitracin-Polymyxin (TRIPLE ANTIBIOTIC) [phone] OINT Apply 1 application topically as needed.       No Known Allergies    The results of significant diagnostics from this hospitalization (including imaging, microbiology, ancillary and laboratory) are listed below for reference.    Significant Diagnostic Studies: Dg Chest 2 View  03/11/2016  CLINICAL DATA:  Altered mental status with hypoxia EXAM: CHEST  2 VIEW COMPARISON:  02/28/2015 FINDINGS: Chronic bronchitic markings. Low volume chest without consolidation or edema. Calcified granuloma over the right chest. Normal heart size and stable mediastinal contours allowing for distortion by rightward rotation. IMPRESSION: Chronic bronchitic markings.  No evidence of acute disease. Electronically Signed   By: Marnee Spring M.D.   On: 03/11/2016 12:48   Ct Head Wo Contrast  03/11/2016  CLINICAL DATA:  Altered mental status. EXAM: CT HEAD WITHOUT CONTRAST TECHNIQUE: Contiguous axial images were obtained from the base of the skull through the vertex without intravenous contrast. COMPARISON:  February 28, 2015. FINDINGS:  Bony calvarium appears intact. Mild diffuse cortical atrophy is noted. Mild chronic ischemic white matter disease is noted. No mass effect or midline shift is noted. Ventricular size is within normal limits. There is no evidence of mass lesion, hemorrhage or acute infarction. IMPRESSION: Mild diffuse cortical atrophy. Mild chronic ischemic white matter disease. No acute intracranial abnormality seen. Electronically Signed   By: Lupita Raider, M.D.   On: 03/11/2016 12:55   Ct Angio Chest Pe W/cm &/or Wo Cm  03/12/2016  CLINICAL DATA:  Followup pulmonary emboli EXAM: CT ANGIOGRAPHY CHEST WITH CONTRAST TECHNIQUE: Multidetector CT imaging of the chest was performed using the standard protocol during bolus administration of intravenous contrast. Multiplanar CT image reconstructions and MIPs were obtained to evaluate the vascular anatomy. CONTRAST:  15 mL Isovue 370 COMPARISON:  No prior chest CTs; 03/11/2016 chest radiograph FINDINGS: Mild scattered ground-glass opacities bilaterally. Calcified 1 cm nodule superior segment right lower lobe. No  pleural effusion. Bilateral bronchitic change with perihilar bronchial wall calcification bilaterally. No significant hilar or mediastinal adenopathy. No filling defects in the pulmonary arterial system. Thoracic aorta shows mild calcification to moderate calcification with no dilatation. Mild cardiac enlargement.  No pericardial effusion. No acute abnormalities upper abdomen. Numerous splenic calcifications likely represent granulomas. Nonobstructing 3 mm stone upper pole left kidney. No acute musculoskeletal findings. Review of the MIP images confirms the above findings. IMPRESSION: Bilateral chronic appearing bronchitic change. Nonspecific mild ground-glass attenuation bilaterally. Differential diagnostic possibilities include chronic interstitial lung disease. Evidence of prior granulomatous disease. Electronically Signed   By: Esperanza Heiraymond  Rubner M.D.   On: 03/12/2016 21:53     Microbiology: Recent Results (from the past 240 hour(s))  Urine culture     Status: Abnormal (Preliminary result)   Collection Time: 03/11/16 11:57 AM  Result Value Ref Range Status   Specimen Description URINE, CATHETERIZED  Final   Special Requests NONE  Final   Culture >=100,000 COLONIES/mL ESCHERICHIA COLI (A)  Final   Report Status PENDING  Incomplete   Organism ID, Bacteria ESCHERICHIA COLI (A)  Final      Susceptibility   Escherichia coli - MIC*    AMPICILLIN <=2 SENSITIVE Sensitive     CEFAZOLIN <=4 SENSITIVE Sensitive     CEFTRIAXONE <=1 SENSITIVE Sensitive     CIPROFLOXACIN <=0.25 SENSITIVE Sensitive     GENTAMICIN <=1 SENSITIVE Sensitive     IMIPENEM <=0.25 SENSITIVE Sensitive     NITROFURANTOIN <=16 SENSITIVE Sensitive     TRIMETH/SULFA <=20 SENSITIVE Sensitive     AMPICILLIN/SULBACTAM <=2 SENSITIVE Sensitive     PIP/TAZO <=4 SENSITIVE Sensitive     * >=100,000 COLONIES/mL ESCHERICHIA COLI  MRSA PCR Screening     Status: None   Collection Time: 03/11/16  8:53 PM  Result Value Ref Range Status   MRSA by PCR NEGATIVE NEGATIVE Final    Comment:        The GeneXpert MRSA Assay (FDA approved for NASAL specimens only), is one component of a comprehensive MRSA colonization surveillance program. It is not intended to diagnose MRSA infection nor to guide or monitor treatment for MRSA infections.      Labs: Basic Metabolic Panel:  Recent Labs Lab 03/11/16 1120 03/12/16 0739  NA 133* 141  K 3.7 3.9  CL 101 104  CO2 27 25  GLUCOSE 188* 109*  BUN 14 10  CREATININE 0.98 0.83  CALCIUM 8.5* 8.9   Liver Function Tests:  Recent Labs Lab 03/11/16 1120 03/12/16 0739  AST 19 16  ALT 10* 10*  ALKPHOS 59 51  BILITOT 0.9 1.0  PROT 7.1 6.2*  ALBUMIN 3.5 2.7*    Recent Labs Lab 03/11/16 1120  LIPASE 18    Recent Labs Lab 03/12/16 0739  AMMONIA 19   CBC:  Recent Labs Lab 03/11/16 1120 03/12/16 0739  WBC 9.2 8.1  NEUTROABS 7.4 6.3  HGB  13.3 11.8*  HCT 40.8 37.2  MCV 95.1 95.6  PLT 208 211   Cardiac Enzymes: No results for input(s): CKTOTAL, CKMB, CKMBINDEX, TROPONINI in the last 168 hours. BNP: BNP (last 3 results)  Recent Labs  03/12/16 0739  BNP 275.0*    ProBNP (last 3 results) No results for input(s): PROBNP in the last 8760 hours.  CBG:  Recent Labs Lab 03/12/16 1208 03/13/16 0002 03/13/16 0529 03/13/16 1219  GLUCAP 106* 92 90 91       Signed:  Rhetta MuraSAMTANI, JAI-GURMUKH MD   [facility] 03/13/2016, 2:55  PM

## 2016-03-13 NOTE — Progress Notes (Signed)
SATURATION QUALIFICATIONS: (This note is used to comply with regulatory documentation for home oxygen)  Patient Saturations on Room Air at Rest = 98%  Patient Saturations on Room Air while Ambulating = 95%   

## 2016-03-13 NOTE — Care Management Note (Signed)
Case Management Note  Patient Details  Name: Brooke Phelps MRN: [id number] Date of Birth: January 29, 1928  Subjective/Objective:                 Spoke with patient and daughter in law in the room. Patient is still confused today and being treated for UTI with IV Abx. She is not at baseline per DIL report. [facility]. DIL states she receives very little assistance there. Patient has had home oxygen in the past -about 3 years ago- but refused to wear it and so the family returned it. Currently patient is on oxygen while hospitalized. Ra sat in 80's in ED. [facility] and do not contract to anyone specifically for DME.    Action/Plan:  Pt to DC today with Oxygen, [facility] No other [facility] orders.  Expected Discharge Date:                  Expected Discharge Plan:  Assisted Living / Rest Home [facility], not memory care side)  In-House Referral:  Clinical Social Work  Discharge planning Services  CM Consult  Post Acute Care Choice:  Durable Medical Equipment Choice offered to:  Patient  DME Arranged:    DME Agency:  [facility]  HH Arranged:    HH Agency:     Status of Service:  Completed, signed off  Medicare Important Message Given:    Date Medicare IM Given:    Medicare IM give by:    Date Additional Medicare IM Given:    Additional Medicare Important Message give by:     If discussed at Long Length of Stay Meetings, dates discussed:    Additional Comments:  Lawerance SabalDebbie Carolin Quang, RN 03/13/2016, 2:54 PM

## 2016-03-16 LAB — URINE CULTURE

## 2016-03-18 DIAGNOSIS — F0391 Unspecified dementia with behavioral disturbance: Secondary | ICD-10-CM | POA: Diagnosis not present

## 2016-03-18 DIAGNOSIS — J449 Chronic obstructive pulmonary disease, unspecified: Secondary | ICD-10-CM | POA: Diagnosis not present

## 2016-03-18 DIAGNOSIS — Z9181 History of falling: Secondary | ICD-10-CM | POA: Diagnosis not present

## 2016-03-18 DIAGNOSIS — R32 Unspecified urinary incontinence: Secondary | ICD-10-CM | POA: Diagnosis not present

## 2016-03-18 DIAGNOSIS — Z7982 Long term (current) use of aspirin: Secondary | ICD-10-CM | POA: Diagnosis not present

## 2016-03-18 DIAGNOSIS — B9620 Unspecified Escherichia coli [E. coli] as the cause of diseases classified elsewhere: Secondary | ICD-10-CM | POA: Diagnosis not present

## 2016-03-18 DIAGNOSIS — Z9981 Dependence on supplemental oxygen: Secondary | ICD-10-CM | POA: Diagnosis not present

## 2016-03-18 DIAGNOSIS — Z86711 Personal history of pulmonary embolism: Secondary | ICD-10-CM | POA: Diagnosis not present

## 2016-03-18 DIAGNOSIS — N12 Tubulo-interstitial nephritis, not specified as acute or chronic: Secondary | ICD-10-CM | POA: Diagnosis not present

## 2016-03-18 DIAGNOSIS — Z792 Long term (current) use of antibiotics: Secondary | ICD-10-CM | POA: Diagnosis not present

## 2016-03-18 DIAGNOSIS — I4891 Unspecified atrial fibrillation: Secondary | ICD-10-CM | POA: Diagnosis not present

## 2016-03-18 DIAGNOSIS — I509 Heart failure, unspecified: Secondary | ICD-10-CM | POA: Diagnosis not present

## 2016-03-19 DIAGNOSIS — N12 Tubulo-interstitial nephritis, not specified as acute or chronic: Secondary | ICD-10-CM | POA: Diagnosis not present

## 2016-03-19 DIAGNOSIS — J449 Chronic obstructive pulmonary disease, unspecified: Secondary | ICD-10-CM | POA: Diagnosis not present

## 2016-03-19 DIAGNOSIS — B9620 Unspecified Escherichia coli [E. coli] as the cause of diseases classified elsewhere: Secondary | ICD-10-CM | POA: Diagnosis not present

## 2016-03-19 DIAGNOSIS — I4891 Unspecified atrial fibrillation: Secondary | ICD-10-CM | POA: Diagnosis not present

## 2016-03-19 DIAGNOSIS — F0391 Unspecified dementia with behavioral disturbance: Secondary | ICD-10-CM | POA: Diagnosis not present

## 2016-03-19 DIAGNOSIS — I509 Heart failure, unspecified: Secondary | ICD-10-CM | POA: Diagnosis not present

## 2016-03-20 DIAGNOSIS — I509 Heart failure, unspecified: Secondary | ICD-10-CM | POA: Diagnosis not present

## 2016-03-20 DIAGNOSIS — E782 Mixed hyperlipidemia: Secondary | ICD-10-CM | POA: Diagnosis not present

## 2016-03-20 DIAGNOSIS — R309 Painful micturition, unspecified: Secondary | ICD-10-CM | POA: Diagnosis not present

## 2016-03-20 DIAGNOSIS — B9620 Unspecified Escherichia coli [E. coli] as the cause of diseases classified elsewhere: Secondary | ICD-10-CM | POA: Diagnosis not present

## 2016-03-20 DIAGNOSIS — N12 Tubulo-interstitial nephritis, not specified as acute or chronic: Secondary | ICD-10-CM | POA: Diagnosis not present

## 2016-03-20 DIAGNOSIS — J449 Chronic obstructive pulmonary disease, unspecified: Secondary | ICD-10-CM | POA: Diagnosis not present

## 2016-03-20 DIAGNOSIS — I10 Essential (primary) hypertension: Secondary | ICD-10-CM | POA: Diagnosis not present

## 2016-03-20 DIAGNOSIS — I4891 Unspecified atrial fibrillation: Secondary | ICD-10-CM | POA: Diagnosis not present

## 2016-03-20 DIAGNOSIS — F0391 Unspecified dementia with behavioral disturbance: Secondary | ICD-10-CM | POA: Diagnosis not present

## 2016-03-20 DIAGNOSIS — I480 Paroxysmal atrial fibrillation: Secondary | ICD-10-CM | POA: Diagnosis not present

## 2016-03-20 DIAGNOSIS — N390 Urinary tract infection, site not specified: Secondary | ICD-10-CM | POA: Diagnosis not present

## 2016-03-21 DIAGNOSIS — I509 Heart failure, unspecified: Secondary | ICD-10-CM | POA: Diagnosis not present

## 2016-03-21 DIAGNOSIS — J449 Chronic obstructive pulmonary disease, unspecified: Secondary | ICD-10-CM | POA: Diagnosis not present

## 2016-03-21 DIAGNOSIS — F0391 Unspecified dementia with behavioral disturbance: Secondary | ICD-10-CM | POA: Diagnosis not present

## 2016-03-21 DIAGNOSIS — N12 Tubulo-interstitial nephritis, not specified as acute or chronic: Secondary | ICD-10-CM | POA: Diagnosis not present

## 2016-03-21 DIAGNOSIS — B9620 Unspecified Escherichia coli [E. coli] as the cause of diseases classified elsewhere: Secondary | ICD-10-CM | POA: Diagnosis not present

## 2016-03-21 DIAGNOSIS — I4891 Unspecified atrial fibrillation: Secondary | ICD-10-CM | POA: Diagnosis not present

## 2016-03-23 DIAGNOSIS — I4891 Unspecified atrial fibrillation: Secondary | ICD-10-CM | POA: Diagnosis not present

## 2016-03-23 DIAGNOSIS — B9620 Unspecified Escherichia coli [E. coli] as the cause of diseases classified elsewhere: Secondary | ICD-10-CM | POA: Diagnosis not present

## 2016-03-23 DIAGNOSIS — I509 Heart failure, unspecified: Secondary | ICD-10-CM | POA: Diagnosis not present

## 2016-03-23 DIAGNOSIS — J449 Chronic obstructive pulmonary disease, unspecified: Secondary | ICD-10-CM | POA: Diagnosis not present

## 2016-03-23 DIAGNOSIS — F0391 Unspecified dementia with behavioral disturbance: Secondary | ICD-10-CM | POA: Diagnosis not present

## 2016-03-23 DIAGNOSIS — N12 Tubulo-interstitial nephritis, not specified as acute or chronic: Secondary | ICD-10-CM | POA: Diagnosis not present

## 2016-03-24 DIAGNOSIS — B9620 Unspecified Escherichia coli [E. coli] as the cause of diseases classified elsewhere: Secondary | ICD-10-CM | POA: Diagnosis not present

## 2016-03-24 DIAGNOSIS — I4891 Unspecified atrial fibrillation: Secondary | ICD-10-CM | POA: Diagnosis not present

## 2016-03-24 DIAGNOSIS — F0391 Unspecified dementia with behavioral disturbance: Secondary | ICD-10-CM | POA: Diagnosis not present

## 2016-03-24 DIAGNOSIS — N12 Tubulo-interstitial nephritis, not specified as acute or chronic: Secondary | ICD-10-CM | POA: Diagnosis not present

## 2016-03-24 DIAGNOSIS — J449 Chronic obstructive pulmonary disease, unspecified: Secondary | ICD-10-CM | POA: Diagnosis not present

## 2016-03-24 DIAGNOSIS — I509 Heart failure, unspecified: Secondary | ICD-10-CM | POA: Diagnosis not present

## 2016-03-26 DIAGNOSIS — I509 Heart failure, unspecified: Secondary | ICD-10-CM | POA: Diagnosis not present

## 2016-03-26 DIAGNOSIS — N12 Tubulo-interstitial nephritis, not specified as acute or chronic: Secondary | ICD-10-CM | POA: Diagnosis not present

## 2016-03-26 DIAGNOSIS — F0391 Unspecified dementia with behavioral disturbance: Secondary | ICD-10-CM | POA: Diagnosis not present

## 2016-03-26 DIAGNOSIS — B9620 Unspecified Escherichia coli [E. coli] as the cause of diseases classified elsewhere: Secondary | ICD-10-CM | POA: Diagnosis not present

## 2016-03-26 DIAGNOSIS — I4891 Unspecified atrial fibrillation: Secondary | ICD-10-CM | POA: Diagnosis not present

## 2016-03-26 DIAGNOSIS — J449 Chronic obstructive pulmonary disease, unspecified: Secondary | ICD-10-CM | POA: Diagnosis not present

## 2016-03-27 DIAGNOSIS — N12 Tubulo-interstitial nephritis, not specified as acute or chronic: Secondary | ICD-10-CM | POA: Diagnosis not present

## 2016-03-27 DIAGNOSIS — B9620 Unspecified Escherichia coli [E. coli] as the cause of diseases classified elsewhere: Secondary | ICD-10-CM | POA: Diagnosis not present

## 2016-03-27 DIAGNOSIS — J449 Chronic obstructive pulmonary disease, unspecified: Secondary | ICD-10-CM | POA: Diagnosis not present

## 2016-03-27 DIAGNOSIS — I509 Heart failure, unspecified: Secondary | ICD-10-CM | POA: Diagnosis not present

## 2016-03-27 DIAGNOSIS — F0391 Unspecified dementia with behavioral disturbance: Secondary | ICD-10-CM | POA: Diagnosis not present

## 2016-03-27 DIAGNOSIS — I4891 Unspecified atrial fibrillation: Secondary | ICD-10-CM | POA: Diagnosis not present

## 2016-04-03 DIAGNOSIS — J8410 Pulmonary fibrosis, unspecified: Secondary | ICD-10-CM | POA: Diagnosis not present

## 2016-04-03 DIAGNOSIS — R262 Difficulty in walking, not elsewhere classified: Secondary | ICD-10-CM | POA: Diagnosis not present

## 2016-04-03 DIAGNOSIS — I4891 Unspecified atrial fibrillation: Secondary | ICD-10-CM | POA: Diagnosis not present

## 2016-04-03 DIAGNOSIS — J42 Unspecified chronic bronchitis: Secondary | ICD-10-CM | POA: Diagnosis not present

## 2016-04-03 DIAGNOSIS — Z87440 Personal history of urinary (tract) infections: Secondary | ICD-10-CM | POA: Diagnosis not present

## 2016-04-03 DIAGNOSIS — F0391 Unspecified dementia with behavioral disturbance: Secondary | ICD-10-CM | POA: Diagnosis not present

## 2016-04-06 DIAGNOSIS — J42 Unspecified chronic bronchitis: Secondary | ICD-10-CM | POA: Diagnosis not present

## 2016-04-06 DIAGNOSIS — J8410 Pulmonary fibrosis, unspecified: Secondary | ICD-10-CM | POA: Diagnosis not present

## 2016-04-06 DIAGNOSIS — F0391 Unspecified dementia with behavioral disturbance: Secondary | ICD-10-CM | POA: Diagnosis not present

## 2016-04-06 DIAGNOSIS — R262 Difficulty in walking, not elsewhere classified: Secondary | ICD-10-CM | POA: Diagnosis not present

## 2016-04-06 DIAGNOSIS — I4891 Unspecified atrial fibrillation: Secondary | ICD-10-CM | POA: Diagnosis not present

## 2016-04-06 DIAGNOSIS — Z87440 Personal history of urinary (tract) infections: Secondary | ICD-10-CM | POA: Diagnosis not present

## 2016-04-07 DIAGNOSIS — F0391 Unspecified dementia with behavioral disturbance: Secondary | ICD-10-CM | POA: Diagnosis not present

## 2016-04-07 DIAGNOSIS — J42 Unspecified chronic bronchitis: Secondary | ICD-10-CM | POA: Diagnosis not present

## 2016-04-07 DIAGNOSIS — R262 Difficulty in walking, not elsewhere classified: Secondary | ICD-10-CM | POA: Diagnosis not present

## 2016-04-07 DIAGNOSIS — Z87440 Personal history of urinary (tract) infections: Secondary | ICD-10-CM | POA: Diagnosis not present

## 2016-04-07 DIAGNOSIS — J8410 Pulmonary fibrosis, unspecified: Secondary | ICD-10-CM | POA: Diagnosis not present

## 2016-04-07 DIAGNOSIS — I4891 Unspecified atrial fibrillation: Secondary | ICD-10-CM | POA: Diagnosis not present

## 2016-04-08 DIAGNOSIS — J8410 Pulmonary fibrosis, unspecified: Secondary | ICD-10-CM | POA: Diagnosis not present

## 2016-04-08 DIAGNOSIS — R262 Difficulty in walking, not elsewhere classified: Secondary | ICD-10-CM | POA: Diagnosis not present

## 2016-04-08 DIAGNOSIS — I4891 Unspecified atrial fibrillation: Secondary | ICD-10-CM | POA: Diagnosis not present

## 2016-04-08 DIAGNOSIS — Z87440 Personal history of urinary (tract) infections: Secondary | ICD-10-CM | POA: Diagnosis not present

## 2016-04-08 DIAGNOSIS — J42 Unspecified chronic bronchitis: Secondary | ICD-10-CM | POA: Diagnosis not present

## 2016-04-08 DIAGNOSIS — F0391 Unspecified dementia with behavioral disturbance: Secondary | ICD-10-CM | POA: Diagnosis not present

## 2016-04-10 DIAGNOSIS — I4891 Unspecified atrial fibrillation: Secondary | ICD-10-CM | POA: Diagnosis not present

## 2016-04-10 DIAGNOSIS — R262 Difficulty in walking, not elsewhere classified: Secondary | ICD-10-CM | POA: Diagnosis not present

## 2016-04-10 DIAGNOSIS — J42 Unspecified chronic bronchitis: Secondary | ICD-10-CM | POA: Diagnosis not present

## 2016-04-10 DIAGNOSIS — Z87440 Personal history of urinary (tract) infections: Secondary | ICD-10-CM | POA: Diagnosis not present

## 2016-04-10 DIAGNOSIS — J8410 Pulmonary fibrosis, unspecified: Secondary | ICD-10-CM | POA: Diagnosis not present

## 2016-04-10 DIAGNOSIS — F0391 Unspecified dementia with behavioral disturbance: Secondary | ICD-10-CM | POA: Diagnosis not present

## 2016-04-13 DIAGNOSIS — R262 Difficulty in walking, not elsewhere classified: Secondary | ICD-10-CM | POA: Diagnosis not present

## 2016-04-13 DIAGNOSIS — J42 Unspecified chronic bronchitis: Secondary | ICD-10-CM | POA: Diagnosis not present

## 2016-04-13 DIAGNOSIS — F0391 Unspecified dementia with behavioral disturbance: Secondary | ICD-10-CM | POA: Diagnosis not present

## 2016-04-13 DIAGNOSIS — J8410 Pulmonary fibrosis, unspecified: Secondary | ICD-10-CM | POA: Diagnosis not present

## 2016-04-13 DIAGNOSIS — Z87440 Personal history of urinary (tract) infections: Secondary | ICD-10-CM | POA: Diagnosis not present

## 2016-04-13 DIAGNOSIS — I4891 Unspecified atrial fibrillation: Secondary | ICD-10-CM | POA: Diagnosis not present

## 2016-04-15 DIAGNOSIS — I4891 Unspecified atrial fibrillation: Secondary | ICD-10-CM | POA: Diagnosis not present

## 2016-04-15 DIAGNOSIS — F0391 Unspecified dementia with behavioral disturbance: Secondary | ICD-10-CM | POA: Diagnosis not present

## 2016-04-15 DIAGNOSIS — R262 Difficulty in walking, not elsewhere classified: Secondary | ICD-10-CM | POA: Diagnosis not present

## 2016-04-15 DIAGNOSIS — J8410 Pulmonary fibrosis, unspecified: Secondary | ICD-10-CM | POA: Diagnosis not present

## 2016-04-15 DIAGNOSIS — Z87440 Personal history of urinary (tract) infections: Secondary | ICD-10-CM | POA: Diagnosis not present

## 2016-04-15 DIAGNOSIS — J42 Unspecified chronic bronchitis: Secondary | ICD-10-CM | POA: Diagnosis not present

## 2016-04-17 DIAGNOSIS — Z87440 Personal history of urinary (tract) infections: Secondary | ICD-10-CM | POA: Diagnosis not present

## 2016-04-17 DIAGNOSIS — I4891 Unspecified atrial fibrillation: Secondary | ICD-10-CM | POA: Diagnosis not present

## 2016-04-17 DIAGNOSIS — J8410 Pulmonary fibrosis, unspecified: Secondary | ICD-10-CM | POA: Diagnosis not present

## 2016-04-17 DIAGNOSIS — F0391 Unspecified dementia with behavioral disturbance: Secondary | ICD-10-CM | POA: Diagnosis not present

## 2016-04-17 DIAGNOSIS — R262 Difficulty in walking, not elsewhere classified: Secondary | ICD-10-CM | POA: Diagnosis not present

## 2016-04-17 DIAGNOSIS — J42 Unspecified chronic bronchitis: Secondary | ICD-10-CM | POA: Diagnosis not present

## 2016-04-20 DIAGNOSIS — I4891 Unspecified atrial fibrillation: Secondary | ICD-10-CM | POA: Diagnosis not present

## 2016-04-20 DIAGNOSIS — J42 Unspecified chronic bronchitis: Secondary | ICD-10-CM | POA: Diagnosis not present

## 2016-04-20 DIAGNOSIS — J8410 Pulmonary fibrosis, unspecified: Secondary | ICD-10-CM | POA: Diagnosis not present

## 2016-04-20 DIAGNOSIS — Z87440 Personal history of urinary (tract) infections: Secondary | ICD-10-CM | POA: Diagnosis not present

## 2016-04-20 DIAGNOSIS — F0391 Unspecified dementia with behavioral disturbance: Secondary | ICD-10-CM | POA: Diagnosis not present

## 2016-04-20 DIAGNOSIS — R262 Difficulty in walking, not elsewhere classified: Secondary | ICD-10-CM | POA: Diagnosis not present

## 2016-04-21 DIAGNOSIS — Z87440 Personal history of urinary (tract) infections: Secondary | ICD-10-CM | POA: Diagnosis not present

## 2016-04-21 DIAGNOSIS — J8410 Pulmonary fibrosis, unspecified: Secondary | ICD-10-CM | POA: Diagnosis not present

## 2016-04-21 DIAGNOSIS — I4891 Unspecified atrial fibrillation: Secondary | ICD-10-CM | POA: Diagnosis not present

## 2016-04-21 DIAGNOSIS — F0391 Unspecified dementia with behavioral disturbance: Secondary | ICD-10-CM | POA: Diagnosis not present

## 2016-04-21 DIAGNOSIS — J42 Unspecified chronic bronchitis: Secondary | ICD-10-CM | POA: Diagnosis not present

## 2016-04-21 DIAGNOSIS — R262 Difficulty in walking, not elsewhere classified: Secondary | ICD-10-CM | POA: Diagnosis not present

## 2016-04-23 DIAGNOSIS — J8410 Pulmonary fibrosis, unspecified: Secondary | ICD-10-CM | POA: Diagnosis not present

## 2016-04-23 DIAGNOSIS — R262 Difficulty in walking, not elsewhere classified: Secondary | ICD-10-CM | POA: Diagnosis not present

## 2016-04-23 DIAGNOSIS — Z87440 Personal history of urinary (tract) infections: Secondary | ICD-10-CM | POA: Diagnosis not present

## 2016-04-23 DIAGNOSIS — F0391 Unspecified dementia with behavioral disturbance: Secondary | ICD-10-CM | POA: Diagnosis not present

## 2016-04-23 DIAGNOSIS — J42 Unspecified chronic bronchitis: Secondary | ICD-10-CM | POA: Diagnosis not present

## 2016-04-23 DIAGNOSIS — I4891 Unspecified atrial fibrillation: Secondary | ICD-10-CM | POA: Diagnosis not present

## 2016-04-27 DIAGNOSIS — I4891 Unspecified atrial fibrillation: Secondary | ICD-10-CM | POA: Diagnosis not present

## 2016-04-27 DIAGNOSIS — F0391 Unspecified dementia with behavioral disturbance: Secondary | ICD-10-CM | POA: Diagnosis not present

## 2016-04-27 DIAGNOSIS — J42 Unspecified chronic bronchitis: Secondary | ICD-10-CM | POA: Diagnosis not present

## 2016-04-27 DIAGNOSIS — J8410 Pulmonary fibrosis, unspecified: Secondary | ICD-10-CM | POA: Diagnosis not present

## 2016-04-27 DIAGNOSIS — R262 Difficulty in walking, not elsewhere classified: Secondary | ICD-10-CM | POA: Diagnosis not present

## 2016-04-27 DIAGNOSIS — Z87440 Personal history of urinary (tract) infections: Secondary | ICD-10-CM | POA: Diagnosis not present

## 2016-04-28 DIAGNOSIS — J8410 Pulmonary fibrosis, unspecified: Secondary | ICD-10-CM | POA: Diagnosis not present

## 2016-04-28 DIAGNOSIS — Z87440 Personal history of urinary (tract) infections: Secondary | ICD-10-CM | POA: Diagnosis not present

## 2016-04-28 DIAGNOSIS — R262 Difficulty in walking, not elsewhere classified: Secondary | ICD-10-CM | POA: Diagnosis not present

## 2016-04-28 DIAGNOSIS — F0391 Unspecified dementia with behavioral disturbance: Secondary | ICD-10-CM | POA: Diagnosis not present

## 2016-04-28 DIAGNOSIS — J42 Unspecified chronic bronchitis: Secondary | ICD-10-CM | POA: Diagnosis not present

## 2016-04-28 DIAGNOSIS — I4891 Unspecified atrial fibrillation: Secondary | ICD-10-CM | POA: Diagnosis not present

## 2016-05-01 DIAGNOSIS — J42 Unspecified chronic bronchitis: Secondary | ICD-10-CM | POA: Diagnosis not present

## 2016-05-01 DIAGNOSIS — F0391 Unspecified dementia with behavioral disturbance: Secondary | ICD-10-CM | POA: Diagnosis not present

## 2016-05-01 DIAGNOSIS — Z87440 Personal history of urinary (tract) infections: Secondary | ICD-10-CM | POA: Diagnosis not present

## 2016-05-01 DIAGNOSIS — I4891 Unspecified atrial fibrillation: Secondary | ICD-10-CM | POA: Diagnosis not present

## 2016-05-01 DIAGNOSIS — J8410 Pulmonary fibrosis, unspecified: Secondary | ICD-10-CM | POA: Diagnosis not present

## 2016-05-01 DIAGNOSIS — R262 Difficulty in walking, not elsewhere classified: Secondary | ICD-10-CM | POA: Diagnosis not present

## 2016-05-04 DIAGNOSIS — J8410 Pulmonary fibrosis, unspecified: Secondary | ICD-10-CM | POA: Diagnosis not present

## 2016-05-04 DIAGNOSIS — I4891 Unspecified atrial fibrillation: Secondary | ICD-10-CM | POA: Diagnosis not present

## 2016-05-04 DIAGNOSIS — R262 Difficulty in walking, not elsewhere classified: Secondary | ICD-10-CM | POA: Diagnosis not present

## 2016-05-04 DIAGNOSIS — J42 Unspecified chronic bronchitis: Secondary | ICD-10-CM | POA: Diagnosis not present

## 2016-05-04 DIAGNOSIS — Z87440 Personal history of urinary (tract) infections: Secondary | ICD-10-CM | POA: Diagnosis not present

## 2016-05-04 DIAGNOSIS — F0391 Unspecified dementia with behavioral disturbance: Secondary | ICD-10-CM | POA: Diagnosis not present

## 2016-05-05 ENCOUNTER — Emergency Department (HOSPITAL_COMMUNITY)
Admission: EM | Admit: 2016-05-05 | Discharge: 2016-05-05 | Disposition: A | Discharge status: Home / Self Care | Payer: Medicare Other | Attending: Emergency Medicine | Admitting: Emergency Medicine

## 2016-05-05 ENCOUNTER — Encounter (HOSPITAL_COMMUNITY): Payer: Self-pay | Admitting: Emergency Medicine

## 2016-05-05 ENCOUNTER — Emergency Department (HOSPITAL_COMMUNITY): Payer: Medicare Other

## 2016-05-05 DIAGNOSIS — S2231XA Fracture of one rib, right side, initial encounter for closed fracture: Secondary | ICD-10-CM

## 2016-05-05 DIAGNOSIS — Z7982 Long term (current) use of aspirin: Secondary | ICD-10-CM | POA: Insufficient documentation

## 2016-05-05 DIAGNOSIS — J449 Chronic obstructive pulmonary disease, unspecified: Secondary | ICD-10-CM | POA: Diagnosis not present

## 2016-05-05 DIAGNOSIS — Y999 Unspecified external cause status: Secondary | ICD-10-CM | POA: Diagnosis not present

## 2016-05-05 DIAGNOSIS — S0993XA Unspecified injury of face, initial encounter: Secondary | ICD-10-CM | POA: Diagnosis not present

## 2016-05-05 DIAGNOSIS — F0390 Unspecified dementia without behavioral disturbance: Secondary | ICD-10-CM | POA: Insufficient documentation

## 2016-05-05 DIAGNOSIS — S299XXA Unspecified injury of thorax, initial encounter: Secondary | ICD-10-CM | POA: Diagnosis present

## 2016-05-05 DIAGNOSIS — S0990XA Unspecified injury of head, initial encounter: Secondary | ICD-10-CM | POA: Diagnosis not present

## 2016-05-05 DIAGNOSIS — S2232XA Fracture of one rib, left side, initial encounter for closed fracture: Secondary | ICD-10-CM | POA: Diagnosis not present

## 2016-05-05 DIAGNOSIS — S0083XA Contusion of other part of head, initial encounter: Secondary | ICD-10-CM | POA: Insufficient documentation

## 2016-05-05 DIAGNOSIS — S0180XA Unspecified open wound of other part of head, initial encounter: Secondary | ICD-10-CM | POA: Diagnosis not present

## 2016-05-05 DIAGNOSIS — Z79899 Other long term (current) drug therapy: Secondary | ICD-10-CM | POA: Diagnosis not present

## 2016-05-05 DIAGNOSIS — Y929 Unspecified place or not applicable: Secondary | ICD-10-CM | POA: Insufficient documentation

## 2016-05-05 DIAGNOSIS — W1830XA Fall on same level, unspecified, initial encounter: Secondary | ICD-10-CM | POA: Diagnosis not present

## 2016-05-05 DIAGNOSIS — S098XXA Other specified injuries of head, initial encounter: Secondary | ICD-10-CM | POA: Diagnosis not present

## 2016-05-05 DIAGNOSIS — S199XXA Unspecified injury of neck, initial encounter: Secondary | ICD-10-CM | POA: Diagnosis not present

## 2016-05-05 DIAGNOSIS — Y9301 Activity, walking, marching and hiking: Secondary | ICD-10-CM | POA: Insufficient documentation

## 2016-05-05 DIAGNOSIS — W19XXXA Unspecified fall, initial encounter: Secondary | ICD-10-CM

## 2016-05-05 MED ORDER — ACETAMINOPHEN 325 MG PO TABS
650.0000 mg | ORAL_TABLET | Freq: Once | ORAL | Status: AC
  Administered 2016-05-05: 650 mg via ORAL
  Filled 2016-05-05: qty 2

## 2016-05-05 NOTE — ED Notes (Signed)
Patient ambulated well in hall with walker.

## 2016-05-05 NOTE — Discharge Instructions (Signed)
You were seen and evaluated today following her fall. You have a small rib fracture on the left side. Continue taking your Tylenol 3 times a day as prescribed outpatient. Use the incentive spirometer at least every 3 hours while awake to keep your lungs open. Follow-up with your primary care physician in about one week for reexamination to make sure that you're healing appropriately and that your respiratory examination remains unchanged.  Rib Fracture A rib fracture is a break or crack in one of the bones of the ribs. The ribs are a group of long, curved bones that wrap around your chest and attach to your spine. They protect your lungs and other organs in the chest cavity. A broken or cracked rib is often painful, but most do not cause other problems. Most rib fractures heal on their own over time. However, rib fractures can be more serious if multiple ribs are broken or if broken ribs move out of place and push against other structures. CAUSES   A direct blow to the chest. For example, this could happen during contact sports, a car accident, or a fall against a hard object.  Repetitive movements with high force, such as pitching a baseball or having severe coughing spells. SYMPTOMS   Pain when you breathe in or cough.  Pain when someone presses on the injured area. DIAGNOSIS  Your caregiver will perform a physical exam. Various imaging tests may be ordered to confirm the diagnosis and to look for related injuries. These tests may include a chest X-ray, computed tomography (CT), magnetic resonance imaging (MRI), or a bone scan. TREATMENT  Rib fractures usually heal on their own in 1-3 months. The longer healing period is often associated with a continued cough or other aggravating activities. During the healing period, pain control is very important. Medication is usually given to control pain. Hospitalization or surgery may be needed for more severe injuries, such as those in which multiple ribs  are broken or the ribs have moved out of place.  HOME CARE INSTRUCTIONS   Avoid strenuous activity and any activities or movements that cause pain. Be careful during activities and avoid bumping the injured rib.  Gradually increase activity as directed by your caregiver.  Only take over-the-counter or prescription medications as directed by your caregiver. Do not take other medications without asking your caregiver first.  Apply ice to the injured area for the first 1-2 days after you have been treated or as directed by your caregiver. Applying ice helps to reduce inflammation and pain.  Put ice in a plastic bag.  Place a towel between your skin and the bag.   Leave the ice on for 15-20 minutes at a time, every 2 hours while you are awake.  Perform deep breathing as directed by your caregiver. This will help prevent pneumonia, which is a common complication of a broken rib. Your caregiver may instruct you to:  Take deep breaths several times a day.  Try to cough several times a day, holding a pillow against the injured area.  Use a device called an incentive spirometer to practice deep breathing several times a day.  Drink enough fluids to keep your urine clear or pale yellow. This will help you avoid constipation.   Do not wear a rib belt or binder. These restrict breathing, which can lead to pneumonia.  SEEK IMMEDIATE MEDICAL CARE IF:   You have a fever.   You have difficulty breathing or shortness of breath.   You  develop a continual cough, or you cough up thick or bloody sputum.  You feel sick to your stomach (nausea), throw up (vomit), or have abdominal pain.   You have worsening pain not controlled with medications.  MAKE SURE YOU:  Understand these instructions.  Will watch your condition.  Will get help right away if you are not doing well or get worse.   This information is not intended to replace advice given to you by your health care provider. Make  sure you discuss any questions you have with your health care provider.   Document Released: 10/19/2005 Document Revised: 06/21/2013 Document Reviewed: 12/21/2012 Elsevier Interactive Patient Education 2016 [employer]  Fall Prevention Falls can cause injuries and can affect people from all age groups. There are many simple things that you can do to make your home safe and to help prevent falls. WHAT CAN I DO ON THE OUTSIDE OF MY HOME?  Regularly repair the edges of walkways and driveways and fix any cracks.  Remove high doorway thresholds.  Trim any shrubbery on the main path into your home.  Use bright outdoor lighting.  Clear walkways of debris and clutter, including tools and rocks.  Regularly check that handrails are securely fastened and in good repair. Both sides of any steps should have handrails.  Install guardrails along the edges of any raised decks or porches.  Have leaves, snow, and ice cleared regularly.  Use sand or salt on walkways during winter months.  In the garage, clean up any spills right away, including grease or oil spills. WHAT CAN I DO IN THE BATHROOM?  Use night lights.  Install grab bars by the toilet and in the tub and shower. Do not use towel bars as grab bars.  Use non-skid mats or decals on the floor of the tub or shower.  If you need to sit down while you are in the shower, use a plastic, non-slip stool.Marland Kitchen.  Keep the floor dry. Immediately clean up any water that spills on the floor.  Remove soap buildup in the tub or shower on a regular basis.  Attach bath mats securely with double-sided non-slip rug tape.  Remove throw rugs and other tripping hazards from the floor. WHAT CAN I DO IN THE BEDROOM?  Use night lights.  Make sure that a bedside light is easy to reach.  Do not use oversized bedding that drapes onto the floor.  Have a firm chair that has side arms to use for getting dressed.  Remove throw rugs and other tripping  hazards from the floor. WHAT CAN I DO IN THE KITCHEN?   Clean up any spills right away.  Avoid walking on wet floors.  Place frequently used items in easy-to-reach places.  If you need to reach for something above you, use a sturdy step stool that has a grab bar.  Keep electrical cables out of the way.  Do not use floor polish or wax that makes floors slippery. If you have to use wax, make sure that it is non-skid floor wax.  Remove throw rugs and other tripping hazards from the floor. WHAT CAN I DO IN THE STAIRWAYS?  Do not leave any items on the stairs.  Make sure that there are handrails on both sides of the stairs. Fix handrails that are broken or loose. Make sure that handrails are as long as the stairways.  Check any carpeting to make sure that it is firmly attached to the stairs. Fix any carpet that  is loose or worn.  Avoid having throw rugs at the top or bottom of stairways, or secure the rugs with carpet tape to prevent them from moving.  Make sure that you have a light switch at the top of the stairs and the bottom of the stairs. If you do not have them, have them installed. WHAT ARE SOME OTHER FALL PREVENTION TIPS?  Wear closed-toe shoes that fit well and support your feet. Wear shoes that have rubber soles or low heels.  When you use a stepladder, make sure that it is completely opened and that the sides are firmly locked. Have someone hold the ladder while you are using it. Do not climb a closed stepladder.  Add color or contrast paint or tape to grab bars and handrails in your home. Place contrasting color strips on the first and last steps.  Use mobility aids as needed, such as canes, walkers, scooters, and crutches.  Turn on lights if it is dark. Replace any light bulbs that burn out.  Set up furniture so that there are clear paths. Keep the furniture in the same spot.  Fix any uneven floor surfaces.  Choose a carpet design that does not hide the edge of  steps of a stairway.  Be aware of any and all pets.  Review your medicines with your healthcare provider. Some medicines can cause dizziness or changes in blood pressure, which increase your risk of falling. Talk with your health care provider about other ways that you can decrease your risk of falls. This may include working with a physical therapist or trainer to improve your strength, balance, and endurance.   This information is not intended to replace advice given to you by your health care provider. Make sure you discuss any questions you have with your health care provider.   Document Released: 10/09/2002 Document Revised: 03/05/2015 Document Reviewed: 11/23/2014 Elsevier Interactive Patient Education [employer]

## 2016-05-05 NOTE — ED Provider Notes (Signed)
CSN: [id number]     Arrival date & time 05/05/16  1439 History   First MD Initiated Contact with Patient 05/05/16 1503     Chief Complaint  Patient presents with  . Fall     (Consider location/radiation/quality/duration/timing/severity/associated sxs/prior Treatment) HPI Comments: 82 year old female with history of dementia, COPD, atrial fibrillation presents following a fall.  Per report patient was walking to the bathroom on her own without a walker and she fell. The patient is supposed to use a walker at all times to ambulate. This is reported as mechanical fall. Patient is not able to give any history and denies any complaints or pain at this time.  The patient reportedly was complaining of left-sided rib pain before. She was noted to have a bruise lateral to her left eye. Patient is reportedly at her mental status baseline and there is no report of syncope or loss of consciousness.  Patient is a 82 y.o. female presenting with fall.  Fall    Past Medical History  Diagnosis Date  . COPD (chronic obstructive pulmonary disease) (HCC)   . Dementia   . Atrial fibrillation (HCC)   . Asthma    Past Surgical History  Procedure Laterality Date  . Orif ankle fracture Left 11/11/2013    Procedure: OPEN REDUCTION INTERNAL FIXATION (ORIF) ANKLE FRACTURE;  Surgeon: Nadara Mustard, MD;  Location: [facility] OR;  Service: Orthopedics;  Laterality: Left;   Family History  Problem Relation Age of Onset  . Dementia Sister   . Diabetes Mellitus II Neg Hx   . Hypertension Neg Hx    Social History  Substance Use Topics  . Smoking status: Never Smoker   . Smokeless tobacco: None  . Alcohol Use: No   OB History    No data available     Review of Systems  Unable to perform ROS: Dementia      Allergies  Review of patient's allergies indicates no known allergies.  Home Medications   Prior to Admission medications   Medication Sig Start Date End Date Taking? Authorizing Provider  acetaminophen  (TYLENOL) 500 MG tablet Take 2 tablets (1,000 mg total) by mouth 3 (three) times daily. Patient taking differently: Take 500 mg by mouth every 4 (four) hours as needed for mild pain.  11/14/13  Yes Shanker Levora Dredge, MD  ALPRAZolam Prudy Feeler) 0.5 MG tablet Take 1 tablet (0.5 mg total) by mouth every 8 (eight) hours as needed for anxiety. Patient taking differently: Take 0.5 mg by mouth 2 (two) times daily.  11/14/13  Yes Shanker Levora Dredge, MD  alum & mag hydroxide-simeth (MINTOX) 200-200-20 MG/5ML suspension Take 30 mLs by mouth every 6 (six) hours as needed for indigestion or heartburn.   Yes Historical Provider, MD  aspirin 325 MG tablet Take 1 tablet (325 mg total) by mouth daily. 11/14/13  Yes Shanker Levora Dredge, MD  docusate sodium 100 MG CAPS Take 100 mg by mouth 2 (two) times daily. Patient taking differently: Take 100 mg by mouth daily.  11/14/13  Yes Shanker Levora Dredge, MD  guaifenesin (ROBITUSSIN) 100 MG/5ML syrup Take 200 mg by mouth 4 (four) times daily as needed for cough.   Yes Historical Provider, MD  levalbuterol (XOPENEX) 0.63 MG/3ML nebulizer solution Take 3 mLs (0.63 mg total) by nebulization 3 (three) times daily. Patient taking differently: Take 0.63 mg by nebulization every 4 (four) hours as needed for wheezing.  11/14/13  Yes Shanker Levora Dredge, MD  loperamide (IMODIUM) 2 MG capsule Take 2  mg by mouth as needed for diarrhea or loose stools.   Yes Historical Provider, MD  magnesium hydroxide (MILK OF MAGNESIA) 400 MG/5ML suspension Take 30 mLs by mouth daily as needed for mild constipation.   Yes Historical Provider, MD  metoprolol (LOPRESSOR) 50 MG tablet Take 1 tablet (50 mg total) by mouth 2 (two) times daily. 11/14/13  Yes Shanker Levora Dredge, MD  Neomycin-Bacitracin-Polymyxin (TRIPLE ANTIBIOTIC) [phone] OINT Apply 1 application topically as needed.   Yes Historical Provider, MD  omeprazole (PRILOSEC) 20 MG capsule Take 20 mg by mouth daily.   Yes Historical Provider, MD  PARoxetine  (PAXIL) 10 MG tablet Take 1 tablet (10 mg total) by mouth daily. 11/14/13  Yes Shanker Levora Dredge, MD  furosemide (LASIX) 20 MG tablet Take 1 tablet (20 mg total) by mouth every other day. 03/13/16   Rhetta Mura, MD  nitrofurantoin, macrocrystal-monohydrate, (MACROBID) 100 MG capsule Take 1 capsule (100 mg total) by mouth every 12 (twelve) hours. Patient not taking: Reported on 05/05/2016 03/13/16   Rhetta Mura, MD   BP 157/74 mmHg  Pulse 49  Temp(Src) 97.3 F (36.3 C) (Oral)  Resp 16  SpO2 94% Physical Exam  Constitutional: She is oriented to person, place, and time. She appears well-developed and well-nourished. No distress.  HENT:  Head: Normocephalic.    Right Ear: External ear normal.  Left Ear: External ear normal.  Nose: Nose normal.  Mouth/Throat: Oropharynx is clear and moist. No oropharyngeal exudate.  Eyes: Conjunctivae and EOM are normal. Pupils are equal, round, and reactive to light. Right eye exhibits normal extraocular motion. Left eye exhibits normal extraocular motion.  Full extraocular movements without restriction or pain  Neck: Normal range of motion and full passive range of motion without pain. Neck supple. No spinous process tenderness and no muscular tenderness present. Normal range of motion present.  Cardiovascular: Normal rate, regular rhythm and intact distal pulses.   No murmur heard. Pulmonary/Chest: Effort normal. No respiratory distress. She has no wheezes. She has no rales. She exhibits no tenderness.  Abdominal: Soft. She exhibits no distension. There is no tenderness.  Musculoskeletal: Normal range of motion. She exhibits no edema or tenderness.  Neurological: She is alert and oriented to person, place, and time. She has normal strength. No sensory deficit. She exhibits normal muscle tone.  Skin: Skin is warm and dry. No rash noted. She is not diaphoretic.  Vitals reviewed.   ED Course  Procedures (including critical care time) Labs  Review Labs Reviewed - No data to display  Imaging Review Dg Ribs Unilateral W/chest Left  05/05/2016  CLINICAL DATA:  Pain following fall EXAM: LEFT RIBS AND CHEST - 3+ VIEW COMPARISON:  Chest radiograph Mar 11, 2016 and chest CT Mar 12, 2016 FINDINGS: Frontal chest as well as oblique and cone-down lower rib images were obtained. There is mild underlying interstitial fibrosis, stable. There is no edema or consolidation. The heart is borderline enlarged with pulmonary vascularity within normal limits. There is atherosclerotic calcification in the aorta. No adenopathy is evident. There is a nondisplaced fracture of the anterior left seventh rib. No other fracture evident. No pneumothorax or effusion. IMPRESSION: Nondisplaced fracture anterior left seventh rib. No pneumothorax. Mild underlying interstitial fibrosis. Borderline cardiomegaly. Electronically Signed   By: Bretta Bang III M.D.   On: 05/05/2016 15:10   Ct Head Wo Contrast  05/05/2016  CLINICAL DATA:  Fall. EXAM: CT HEAD WITHOUT CONTRAST CT MAXILLOFACIAL WITHOUT CONTRAST CT CERVICAL SPINE WITHOUT CONTRAST TECHNIQUE: Multidetector  CT imaging of the head, cervical spine, and maxillofacial structures were performed using the standard protocol without intravenous contrast. Multiplanar CT image reconstructions of the cervical spine and maxillofacial structures were also generated. COMPARISON:  03/11/2016 FINDINGS: CT HEAD FINDINGS There is mild diffuse low-attenuation within the subcortical and periventricular white matter compatible with chronic microvascular disease. There is prominence of the sulci and ventricles compatible with brain atrophy. Encephalomalacia within the right occipital lobe appears to have progressed from previous exam. No evidence for acute brain infarct, acute intracranial hemorrhage or mass. Bilateral mastoid air cell opacification is identified. Fluid levels identified within the sphenoid sinus and left maxillary sinus. The  calvarium appears intact. CT MAXILLOFACIAL FINDINGS Fluid level noted within the left maxillary sinus. The orbits appear intact. The nasal septum and nasal bones appear intact. The maxilla and mandible are intact. CT CERVICAL SPINE FINDINGS The bone superior diffusely osteopenic. There is straightening of normal cervical lordosis. The facet joints appear all well aligned. The prevertebral soft tissue space is normal. No fracture or subluxation identified. IMPRESSION: 1. No acute intracranial abnormalities. 2. Chronic small vessel ischemic disease and brain atrophy. 3. Left maxillaries sinus and sphenoid sinus partial opacification. There are also of bilateral mastoid air cells effusions. 4. No evidence for cervical spine fracture. Electronically Signed   By: Signa Kellaylor  Stroud M.D.   On: 05/05/2016 15:57   Ct Cervical Spine Wo Contrast  05/05/2016  CLINICAL DATA:  Fall. EXAM: CT HEAD WITHOUT CONTRAST CT MAXILLOFACIAL WITHOUT CONTRAST CT CERVICAL SPINE WITHOUT CONTRAST TECHNIQUE: Multidetector CT imaging of the head, cervical spine, and maxillofacial structures were performed using the standard protocol without intravenous contrast. Multiplanar CT image reconstructions of the cervical spine and maxillofacial structures were also generated. COMPARISON:  03/11/2016 FINDINGS: CT HEAD FINDINGS There is mild diffuse low-attenuation within the subcortical and periventricular white matter compatible with chronic microvascular disease. There is prominence of the sulci and ventricles compatible with brain atrophy. Encephalomalacia within the right occipital lobe appears to have progressed from previous exam. No evidence for acute brain infarct, acute intracranial hemorrhage or mass. Bilateral mastoid air cell opacification is identified. Fluid levels identified within the sphenoid sinus and left maxillary sinus. The calvarium appears intact. CT MAXILLOFACIAL FINDINGS Fluid level noted within the left maxillary sinus. The orbits  appear intact. The nasal septum and nasal bones appear intact. The maxilla and mandible are intact. CT CERVICAL SPINE FINDINGS The bone superior diffusely osteopenic. There is straightening of normal cervical lordosis. The facet joints appear all well aligned. The prevertebral soft tissue space is normal. No fracture or subluxation identified. IMPRESSION: 1. No acute intracranial abnormalities. 2. Chronic small vessel ischemic disease and brain atrophy. 3. Left maxillaries sinus and sphenoid sinus partial opacification. There are also of bilateral mastoid air cells effusions. 4. No evidence for cervical spine fracture. Electronically Signed   By: Signa Kellaylor  Stroud M.D.   On: 05/05/2016 15:57   Ct Maxillofacial Wo Cm  05/05/2016  CLINICAL DATA:  Fall. EXAM: CT HEAD WITHOUT CONTRAST CT MAXILLOFACIAL WITHOUT CONTRAST CT CERVICAL SPINE WITHOUT CONTRAST TECHNIQUE: Multidetector CT imaging of the head, cervical spine, and maxillofacial structures were performed using the standard protocol without intravenous contrast. Multiplanar CT image reconstructions of the cervical spine and maxillofacial structures were also generated. COMPARISON:  03/11/2016 FINDINGS: CT HEAD FINDINGS There is mild diffuse low-attenuation within the subcortical and periventricular white matter compatible with chronic microvascular disease. There is prominence of the sulci and ventricles compatible with brain atrophy. Encephalomalacia within the right  occipital lobe appears to have progressed from previous exam. No evidence for acute brain infarct, acute intracranial hemorrhage or mass. Bilateral mastoid air cell opacification is identified. Fluid levels identified within the sphenoid sinus and left maxillary sinus. The calvarium appears intact. CT MAXILLOFACIAL FINDINGS Fluid level noted within the left maxillary sinus. The orbits appear intact. The nasal septum and nasal bones appear intact. The maxilla and mandible are intact. CT CERVICAL SPINE  FINDINGS The bone superior diffusely osteopenic. There is straightening of normal cervical lordosis. The facet joints appear all well aligned. The prevertebral soft tissue space is normal. No fracture or subluxation identified. IMPRESSION: 1. No acute intracranial abnormalities. 2. Chronic small vessel ischemic disease and brain atrophy. 3. Left maxillaries sinus and sphenoid sinus partial opacification. There are also of bilateral mastoid air cells effusions. 4. No evidence for cervical spine fracture. Electronically Signed   By: Signa Kellaylor  Stroud M.D.   On: 05/05/2016 15:57   I have personally reviewed and evaluated these images and lab results as part of my medical decision-making.   EKG Interpretation   Date/Time:  Tuesday May 05 2016 14:47:48 EDT Ventricular Rate:  50 PR Interval:    QRS Duration: 81 QT Interval:  479 QTC Calculation: 437 R Axis:   -2 Text Interpretation:  Sinus rhythm Short PR interval Low voltage,  precordial leads Consider anterior infarct Nonspecific T abnormalities,  lateral leads Baseline wander in lead(s) V6 No significant change since  last tracing Confirmed by NGUYEN, EMILY ([id number]) on 05/05/2016 3:24:37 PM  Also confirmed by Cyndie ChimeNGUYEN, EMILY ([id number]), editor BREWER, CCT, GLENN ([id number])   on 05/05/2016 4:11:03 PM      MDM  Patient was seen and evaluated in stable condition. Patient sustained a mechanical fall. Imaging unremarkable other than solitary left rib fracture. He shouldn't stable with normal respiratory examination. Incentive spirometer ordered. Family at bedside reported that patient appeared well. Son and daughter-in-law watch patient ambulate with me. She ambulated with a walker without difficulty. She was discharged home in stable condition with instruction to continue to take her Tylenol for pain 3 times a day and with an incentive spirometer. She was instructed to follow-up with her primary care physician for reevaluation next week. Final diagnoses:  Rib  fracture, left, closed, initial encounter  Fall, initial encounter    1. Mechanical fall  2. Rib fracture, left    Leta BaptistEmily Roe Nguyen, MD 05/05/16 1651

## 2016-05-05 NOTE — ED Notes (Signed)
Pt transported to xray 

## 2016-05-05 NOTE — ED Notes (Signed)
Per [facility], pt had ground level fall, just fell over, pt supposed to use walker and was not using it. Pt has bruise on L eye, c/o L sided rib pain. No bruising on ribs. Denies any other complaints. Pt has dementia, mental status at baseline. Denies neck/back pain.

## 2016-05-07 DIAGNOSIS — R262 Difficulty in walking, not elsewhere classified: Secondary | ICD-10-CM | POA: Diagnosis not present

## 2016-05-07 DIAGNOSIS — F0391 Unspecified dementia with behavioral disturbance: Secondary | ICD-10-CM | POA: Diagnosis not present

## 2016-05-07 DIAGNOSIS — I4891 Unspecified atrial fibrillation: Secondary | ICD-10-CM | POA: Diagnosis not present

## 2016-05-07 DIAGNOSIS — J42 Unspecified chronic bronchitis: Secondary | ICD-10-CM | POA: Diagnosis not present

## 2016-05-07 DIAGNOSIS — Z87440 Personal history of urinary (tract) infections: Secondary | ICD-10-CM | POA: Diagnosis not present

## 2016-05-07 DIAGNOSIS — J8410 Pulmonary fibrosis, unspecified: Secondary | ICD-10-CM | POA: Diagnosis not present

## 2016-05-12 DIAGNOSIS — J42 Unspecified chronic bronchitis: Secondary | ICD-10-CM | POA: Diagnosis not present

## 2016-05-12 DIAGNOSIS — I4891 Unspecified atrial fibrillation: Secondary | ICD-10-CM | POA: Diagnosis not present

## 2016-05-12 DIAGNOSIS — Z87440 Personal history of urinary (tract) infections: Secondary | ICD-10-CM | POA: Diagnosis not present

## 2016-05-12 DIAGNOSIS — J8410 Pulmonary fibrosis, unspecified: Secondary | ICD-10-CM | POA: Diagnosis not present

## 2016-05-12 DIAGNOSIS — F0391 Unspecified dementia with behavioral disturbance: Secondary | ICD-10-CM | POA: Diagnosis not present

## 2016-05-12 DIAGNOSIS — R262 Difficulty in walking, not elsewhere classified: Secondary | ICD-10-CM | POA: Diagnosis not present

## 2016-05-14 DIAGNOSIS — I4891 Unspecified atrial fibrillation: Secondary | ICD-10-CM | POA: Diagnosis not present

## 2016-05-14 DIAGNOSIS — J42 Unspecified chronic bronchitis: Secondary | ICD-10-CM | POA: Diagnosis not present

## 2016-05-14 DIAGNOSIS — R262 Difficulty in walking, not elsewhere classified: Secondary | ICD-10-CM | POA: Diagnosis not present

## 2016-05-14 DIAGNOSIS — J8410 Pulmonary fibrosis, unspecified: Secondary | ICD-10-CM | POA: Diagnosis not present

## 2016-05-14 DIAGNOSIS — Z87440 Personal history of urinary (tract) infections: Secondary | ICD-10-CM | POA: Diagnosis not present

## 2016-05-14 DIAGNOSIS — F0391 Unspecified dementia with behavioral disturbance: Secondary | ICD-10-CM | POA: Diagnosis not present

## 2016-05-19 DIAGNOSIS — R262 Difficulty in walking, not elsewhere classified: Secondary | ICD-10-CM | POA: Diagnosis not present

## 2016-05-19 DIAGNOSIS — F0391 Unspecified dementia with behavioral disturbance: Secondary | ICD-10-CM | POA: Diagnosis not present

## 2016-05-19 DIAGNOSIS — I4891 Unspecified atrial fibrillation: Secondary | ICD-10-CM | POA: Diagnosis not present

## 2016-05-19 DIAGNOSIS — Z87440 Personal history of urinary (tract) infections: Secondary | ICD-10-CM | POA: Diagnosis not present

## 2016-05-19 DIAGNOSIS — J8410 Pulmonary fibrosis, unspecified: Secondary | ICD-10-CM | POA: Diagnosis not present

## 2016-05-19 DIAGNOSIS — J42 Unspecified chronic bronchitis: Secondary | ICD-10-CM | POA: Diagnosis not present

## 2016-05-20 DIAGNOSIS — R54 Age-related physical debility: Secondary | ICD-10-CM | POA: Diagnosis not present

## 2016-05-20 DIAGNOSIS — R739 Hyperglycemia, unspecified: Secondary | ICD-10-CM | POA: Diagnosis not present

## 2016-05-20 DIAGNOSIS — Z9181 History of falling: Secondary | ICD-10-CM | POA: Diagnosis not present

## 2016-05-20 DIAGNOSIS — F0391 Unspecified dementia with behavioral disturbance: Secondary | ICD-10-CM | POA: Diagnosis not present

## 2016-05-20 DIAGNOSIS — I480 Paroxysmal atrial fibrillation: Secondary | ICD-10-CM | POA: Diagnosis not present

## 2016-05-20 DIAGNOSIS — I10 Essential (primary) hypertension: Secondary | ICD-10-CM | POA: Diagnosis not present

## 2016-05-21 DIAGNOSIS — I4891 Unspecified atrial fibrillation: Secondary | ICD-10-CM | POA: Diagnosis not present

## 2016-05-21 DIAGNOSIS — J42 Unspecified chronic bronchitis: Secondary | ICD-10-CM | POA: Diagnosis not present

## 2016-05-21 DIAGNOSIS — R262 Difficulty in walking, not elsewhere classified: Secondary | ICD-10-CM | POA: Diagnosis not present

## 2016-05-21 DIAGNOSIS — J8410 Pulmonary fibrosis, unspecified: Secondary | ICD-10-CM | POA: Diagnosis not present

## 2016-05-21 DIAGNOSIS — F0391 Unspecified dementia with behavioral disturbance: Secondary | ICD-10-CM | POA: Diagnosis not present

## 2016-05-21 DIAGNOSIS — Z87440 Personal history of urinary (tract) infections: Secondary | ICD-10-CM | POA: Diagnosis not present

## 2016-05-25 DIAGNOSIS — I4891 Unspecified atrial fibrillation: Secondary | ICD-10-CM | POA: Diagnosis not present

## 2016-05-25 DIAGNOSIS — F0391 Unspecified dementia with behavioral disturbance: Secondary | ICD-10-CM | POA: Diagnosis not present

## 2016-05-25 DIAGNOSIS — J8410 Pulmonary fibrosis, unspecified: Secondary | ICD-10-CM | POA: Diagnosis not present

## 2016-05-25 DIAGNOSIS — Z87440 Personal history of urinary (tract) infections: Secondary | ICD-10-CM | POA: Diagnosis not present

## 2016-05-25 DIAGNOSIS — J42 Unspecified chronic bronchitis: Secondary | ICD-10-CM | POA: Diagnosis not present

## 2016-05-25 DIAGNOSIS — R262 Difficulty in walking, not elsewhere classified: Secondary | ICD-10-CM | POA: Diagnosis not present

## 2016-05-26 DIAGNOSIS — J8410 Pulmonary fibrosis, unspecified: Secondary | ICD-10-CM | POA: Diagnosis not present

## 2016-05-26 DIAGNOSIS — J42 Unspecified chronic bronchitis: Secondary | ICD-10-CM | POA: Diagnosis not present

## 2016-05-26 DIAGNOSIS — R262 Difficulty in walking, not elsewhere classified: Secondary | ICD-10-CM | POA: Diagnosis not present

## 2016-05-26 DIAGNOSIS — I4891 Unspecified atrial fibrillation: Secondary | ICD-10-CM | POA: Diagnosis not present

## 2016-05-26 DIAGNOSIS — F0391 Unspecified dementia with behavioral disturbance: Secondary | ICD-10-CM | POA: Diagnosis not present

## 2016-05-26 DIAGNOSIS — Z87440 Personal history of urinary (tract) infections: Secondary | ICD-10-CM | POA: Diagnosis not present

## 2016-07-10 DIAGNOSIS — R634 Abnormal weight loss: Secondary | ICD-10-CM | POA: Diagnosis not present

## 2016-08-12 DIAGNOSIS — M79674 Pain in right toe(s): Secondary | ICD-10-CM | POA: Diagnosis not present

## 2016-08-12 DIAGNOSIS — B351 Tinea unguium: Secondary | ICD-10-CM | POA: Diagnosis not present

## 2016-08-17 DIAGNOSIS — I10 Essential (primary) hypertension: Secondary | ICD-10-CM | POA: Diagnosis not present

## 2016-08-17 DIAGNOSIS — I4891 Unspecified atrial fibrillation: Secondary | ICD-10-CM | POA: Diagnosis not present

## 2016-08-17 DIAGNOSIS — R296 Repeated falls: Secondary | ICD-10-CM | POA: Diagnosis not present

## 2016-08-17 DIAGNOSIS — M6281 Muscle weakness (generalized): Secondary | ICD-10-CM | POA: Diagnosis not present

## 2016-08-17 DIAGNOSIS — G301 Alzheimer's disease with late onset: Secondary | ICD-10-CM | POA: Diagnosis not present

## 2016-08-17 DIAGNOSIS — F0280 Dementia in other diseases classified elsewhere without behavioral disturbance: Secondary | ICD-10-CM | POA: Diagnosis not present

## 2016-08-22 DIAGNOSIS — Z23 Encounter for immunization: Secondary | ICD-10-CM | POA: Diagnosis not present

## 2016-11-03 DIAGNOSIS — I4891 Unspecified atrial fibrillation: Secondary | ICD-10-CM | POA: Diagnosis not present

## 2016-11-03 DIAGNOSIS — G301 Alzheimer's disease with late onset: Secondary | ICD-10-CM | POA: Diagnosis not present

## 2016-11-03 DIAGNOSIS — F419 Anxiety disorder, unspecified: Secondary | ICD-10-CM | POA: Diagnosis not present

## 2016-11-03 DIAGNOSIS — R4182 Altered mental status, unspecified: Secondary | ICD-10-CM | POA: Diagnosis not present

## 2016-11-04 DIAGNOSIS — Z79899 Other long term (current) drug therapy: Secondary | ICD-10-CM | POA: Diagnosis not present

## 2016-11-04 DIAGNOSIS — N390 Urinary tract infection, site not specified: Secondary | ICD-10-CM | POA: Diagnosis not present

## 2016-11-04 DIAGNOSIS — R319 Hematuria, unspecified: Secondary | ICD-10-CM | POA: Diagnosis not present

## 2016-11-16 DIAGNOSIS — F419 Anxiety disorder, unspecified: Secondary | ICD-10-CM | POA: Diagnosis not present

## 2016-11-16 DIAGNOSIS — I4891 Unspecified atrial fibrillation: Secondary | ICD-10-CM | POA: Diagnosis not present

## 2016-11-16 DIAGNOSIS — G301 Alzheimer's disease with late onset: Secondary | ICD-10-CM | POA: Diagnosis not present

## 2016-11-16 DIAGNOSIS — F0280 Dementia in other diseases classified elsewhere without behavioral disturbance: Secondary | ICD-10-CM | POA: Diagnosis not present

## 2016-11-23 DIAGNOSIS — B351 Tinea unguium: Secondary | ICD-10-CM | POA: Diagnosis not present

## 2016-11-23 DIAGNOSIS — M79674 Pain in right toe(s): Secondary | ICD-10-CM | POA: Diagnosis not present

## 2017-01-17 ENCOUNTER — Encounter (HOSPITAL_COMMUNITY): Payer: Self-pay | Admitting: Emergency Medicine

## 2017-01-17 ENCOUNTER — Emergency Department (HOSPITAL_COMMUNITY)
Admission: EM | Admit: 2017-01-17 | Discharge: 2017-01-17 | Disposition: A | Discharge status: Home / Self Care | Payer: Medicare Other | Attending: Emergency Medicine | Admitting: Emergency Medicine

## 2017-01-17 ENCOUNTER — Emergency Department (HOSPITAL_COMMUNITY): Payer: Medicare Other

## 2017-01-17 DIAGNOSIS — Y939 Activity, unspecified: Secondary | ICD-10-CM | POA: Insufficient documentation

## 2017-01-17 DIAGNOSIS — Y929 Unspecified place or not applicable: Secondary | ICD-10-CM | POA: Insufficient documentation

## 2017-01-17 DIAGNOSIS — S098XXA Other specified injuries of head, initial encounter: Secondary | ICD-10-CM | POA: Diagnosis not present

## 2017-01-17 DIAGNOSIS — J449 Chronic obstructive pulmonary disease, unspecified: Secondary | ICD-10-CM | POA: Diagnosis not present

## 2017-01-17 DIAGNOSIS — Z7982 Long term (current) use of aspirin: Secondary | ICD-10-CM | POA: Diagnosis not present

## 2017-01-17 DIAGNOSIS — S0083XA Contusion of other part of head, initial encounter: Secondary | ICD-10-CM | POA: Diagnosis not present

## 2017-01-17 DIAGNOSIS — S199XXA Unspecified injury of neck, initial encounter: Secondary | ICD-10-CM | POA: Diagnosis not present

## 2017-01-17 DIAGNOSIS — Y999 Unspecified external cause status: Secondary | ICD-10-CM | POA: Insufficient documentation

## 2017-01-17 DIAGNOSIS — Z043 Encounter for examination and observation following other accident: Secondary | ICD-10-CM | POA: Diagnosis not present

## 2017-01-17 DIAGNOSIS — S0990XA Unspecified injury of head, initial encounter: Secondary | ICD-10-CM | POA: Diagnosis not present

## 2017-01-17 DIAGNOSIS — W19XXXA Unspecified fall, initial encounter: Secondary | ICD-10-CM | POA: Insufficient documentation

## 2017-01-17 DIAGNOSIS — S0180XA Unspecified open wound of other part of head, initial encounter: Secondary | ICD-10-CM | POA: Diagnosis not present

## 2017-01-17 DIAGNOSIS — S0993XA Unspecified injury of face, initial encounter: Secondary | ICD-10-CM | POA: Diagnosis present

## 2017-01-17 DIAGNOSIS — I621 Nontraumatic extradural hemorrhage: Secondary | ICD-10-CM | POA: Diagnosis not present

## 2017-01-17 MED ORDER — ACETAMINOPHEN 325 MG PO TABS
650.0000 mg | ORAL_TABLET | Freq: Once | ORAL | Status: DC
  Filled 2017-01-17: qty 2

## 2017-01-17 NOTE — ED Provider Notes (Signed)
[facility]-EMERGENCY DEPT Provider Note   CSN: [id number] Arrival date & time: 01/17/17  1903  By signing my name below, I, Brooke Phelps, attest that this documentation has been prepared under the direction and in the presence of Gwyneth Sprout, MD. Electronically Signed: Doreatha Phelps, ED Scribe. 01/17/17. 7:13 PM.     History   Chief Complaint No chief complaint on file.  LEVEL 5 CAVEAT: HPI and ROS limited due to dementia   HPI Brooke Phelps is a 83 y.o. female with h/o dementia brought in by ambulance who presents to the Emergency Department from [facility] for evaluation of injuries s/p unwitnessed fall that occurred 1 hour ago. Per EMS, staff found the pt face down on the ground. EMS reports that the pts behavior is baseline per SNF staff. EMS states the pt has not been nauseated or vomiting.  Hematoma to the forehead. Pt is not currently taking any blood thinners. Per EMS, pt was hypertensive at 186/102 en route. Pt states she remembers falling, but does not remember the cause of the fall. She denies any significant pains aside from mild frontal HA. She also denies visual disturbances.   The history is provided by the patient and the EMS personnel. History limited by: dementia. No language interpreter was used.    Past Medical History:  Diagnosis Date  . Asthma   . Atrial fibrillation (HCC)   . COPD (chronic obstructive pulmonary disease) (HCC)   . Dementia     Patient Active Problem List   Diagnosis Date Noted  . Acute encephalopathy 03/12/2016  . UTI (lower urinary tract infection) 03/12/2016  . Acute respiratory failure with hypoxia (HCC) 03/12/2016  . Altered mental status 03/11/2016  . Atrial flutter (HCC) 11/12/2013  . Hypoxia 11/11/2013  . Dementia with behavioral disturbance 11/11/2013  . ILD (interstitial lung disease) (HCC) 11/11/2013  . Bimalleolar ankle fracture 11/10/2013    Past Surgical History:  Procedure Laterality Date  . ORIF ANKLE FRACTURE  Left 11/11/2013   Procedure: OPEN REDUCTION INTERNAL FIXATION (ORIF) ANKLE FRACTURE;  Surgeon: Nadara Mustard, MD;  Location: [facility] OR;  Service: Orthopedics;  Laterality: Left;    OB History    No data available       Home Medications    Prior to Admission medications   Medication Sig Start Date End Date Taking? Authorizing Provider  acetaminophen (TYLENOL) 500 MG tablet Take 2 tablets (1,000 mg total) by mouth 3 (three) times daily. Patient taking differently: Take 500 mg by mouth every 4 (four) hours as needed for mild pain.  11/14/13   Shanker Levora Dredge, MD  ALPRAZolam Prudy Feeler) 0.5 MG tablet Take 1 tablet (0.5 mg total) by mouth every 8 (eight) hours as needed for anxiety. Patient taking differently: Take 0.5 mg by mouth 2 (two) times daily.  11/14/13   Shanker Levora Dredge, MD  alum & mag hydroxide-simeth (MINTOX) 200-200-20 MG/5ML suspension Take 30 mLs by mouth every 6 (six) hours as needed for indigestion or heartburn.    Historical Provider, MD  aspirin 325 MG tablet Take 1 tablet (325 mg total) by mouth daily. 11/14/13   Shanker Levora Dredge, MD  docusate sodium 100 MG CAPS Take 100 mg by mouth 2 (two) times daily. Patient taking differently: Take 100 mg by mouth daily.  11/14/13   Shanker Levora Dredge, MD  furosemide (LASIX) 20 MG tablet Take 1 tablet (20 mg total) by mouth every other day. 03/13/16   Rhetta Mura, MD  guaifenesin (ROBITUSSIN) 100  MG/5ML syrup Take 200 mg by mouth 4 (four) times daily as needed for cough.    Historical Provider, MD  levalbuterol Pauline Aus(XOPENEX) 0.63 MG/3ML nebulizer solution Take 3 mLs (0.63 mg total) by nebulization 3 (three) times daily. Patient taking differently: Take 0.63 mg by nebulization every 4 (four) hours as needed for wheezing.  11/14/13   Shanker Levora DredgeM Ghimire, MD  loperamide (IMODIUM) 2 MG capsule Take 2 mg by mouth as needed for diarrhea or loose stools.    Historical Provider, MD  magnesium hydroxide (MILK OF MAGNESIA) 400 MG/5ML suspension Take 30 mLs  by mouth daily as needed for mild constipation.    Historical Provider, MD  metoprolol (LOPRESSOR) 50 MG tablet Take 1 tablet (50 mg total) by mouth 2 (two) times daily. 11/14/13   Shanker Levora DredgeM Ghimire, MD  Neomycin-Bacitracin-Polymyxin (TRIPLE ANTIBIOTIC) [phone] OINT Apply 1 application topically as needed.    Historical Provider, MD  nitrofurantoin, macrocrystal-monohydrate, (MACROBID) 100 MG capsule Take 1 capsule (100 mg total) by mouth every 12 (twelve) hours. Patient not taking: Reported on 05/05/2016 03/13/16   Rhetta MuraJai-Gurmukh Samtani, MD  omeprazole (PRILOSEC) 20 MG capsule Take 20 mg by mouth daily.    Historical Provider, MD  PARoxetine (PAXIL) 10 MG tablet Take 1 tablet (10 mg total) by mouth daily. 11/14/13   Shanker Levora DredgeM Ghimire, MD    Family History Family History  Problem Relation Age of Onset  . Dementia Sister   . Diabetes Mellitus II Neg Hx   . Hypertension Neg Hx     Social History Social History  Substance Use Topics  . Smoking status: Never Smoker  . Smokeless tobacco: Not on file  . Alcohol use No     Allergies   Patient has no known allergies.   Review of Systems Review of Systems  Unable to perform ROS: Dementia    Physical Exam Updated Vital Signs BP (!) 182/62   Pulse 60   Temp 97.9 F (36.6 C) (Oral)   Resp (!) 25   SpO2 94%   Physical Exam  Constitutional: She appears well-developed and well-nourished.  HENT:  Head: Normocephalic.  Large hematoma on the forehead with ecchymosis and ecchymosis to the bridge of the nose. No epistaxis.   Eyes: Conjunctivae are normal. Pupils are equal, round, and reactive to light.  Neck: Normal range of motion and full passive range of motion without pain. Neck supple. No spinous process tenderness and no muscular tenderness present. Normal range of motion present.  Cardiovascular: Normal rate and regular rhythm.   Pulmonary/Chest: Effort normal and breath sounds normal. No respiratory distress. She has no wheezes.  She has no rales.  Abdominal: Soft. She exhibits no distension. There is no tenderness.  Musculoskeletal: Normal range of motion.  Able to completely range both hips, shoulders and wrists without pain.   Neurological: She is alert.  Skin: Skin is warm and dry.  Nursing note and vitals reviewed.   ED Treatments / Results   DIAGNOSTIC STUDIES: Oxygen Saturation is 95% on RA, adequate by my interpretation.    COORDINATION OF CARE: 7:08 PM Discussed treatment plan with pt at bedside which includes CT head/neck and pt agreed to plan.    Labs (all labs ordered are listed, but only abnormal results are displayed) Labs Reviewed - No data to display  EKG  EKG Interpretation None       Radiology Ct Head Wo Contrast  Result Date: 01/17/2017 CLINICAL DATA:  Unwitnessed fall. Anterior scalp hematoma. History of dementia.  Initial encounter. EXAM: CT HEAD WITHOUT CONTRAST CT CERVICAL SPINE WITHOUT CONTRAST TECHNIQUE: Multidetector CT imaging of the head and cervical spine was performed following the standard protocol without intravenous contrast. Multiplanar CT image reconstructions of the cervical spine were also generated. COMPARISON:  05/05/2016 FINDINGS: CT HEAD FINDINGS Brain: A chronic right PCA infarct is unchanged. There is moderate cerebral atrophy, with prominent temporal lobe atrophy again seen. Periventricular white matter hypodensities are similar to the prior study and nonspecific but compatible with moderate chronic small vessel ischemic disease. Incidental note is made of a cavum septum pellucidum et vergae. There is no evidence of acute cortical infarct, intracranial hemorrhage, mass, midline shift, or extra-axial fluid collection. Vascular: Calcified atherosclerosis at the skullbase. No hyperdense vessel. Skull: No fracture or focal osseous lesion. Sinuses/Orbits: Small volume fluid in the sphenoid sinuses with calcified material in the left sphenoid sinus, similar to the prior  study. Chronic air cell opacification at the mastoid tips bilaterally. Bilateral cataract extraction. Other: Moderate-sized frontal scalp hematoma. CT CERVICAL SPINE FINDINGS Alignment: Unchanged cervical spine straightening. No evidence of traumatic subluxation. Skull base and vertebrae: No evidence of acute fracture or destructive osseous process. Soft tissues and spinal canal: No prevertebral fluid or swelling. No visible canal hematoma. Disc levels: Mild cervical disc degeneration, most notable at C5-6. Mild-to-moderate multilevel cervical facet arthrosis. Upper chest: Mild pleural-parenchymal scarring in the lung apices. Other: Moderate calcified atherosclerosis at the carotid bifurcations. IMPRESSION: 1. No evidence of acute intracranial abnormality. 2. Chronic right PCA infarct. Moderate chronic small vessel ischemic disease. 3. Frontal scalp hematoma. 4. No evidence of acute cervical spine fracture or subluxation. Electronically Signed   By: Sebastian Ache M.D.   On: 01/17/2017 19:48   Ct Cervical Spine Wo Contrast  Result Date: 01/17/2017 CLINICAL DATA:  Unwitnessed fall. Anterior scalp hematoma. History of dementia. Initial encounter. EXAM: CT HEAD WITHOUT CONTRAST CT CERVICAL SPINE WITHOUT CONTRAST TECHNIQUE: Multidetector CT imaging of the head and cervical spine was performed following the standard protocol without intravenous contrast. Multiplanar CT image reconstructions of the cervical spine were also generated. COMPARISON:  05/05/2016 FINDINGS: CT HEAD FINDINGS Brain: A chronic right PCA infarct is unchanged. There is moderate cerebral atrophy, with prominent temporal lobe atrophy again seen. Periventricular white matter hypodensities are similar to the prior study and nonspecific but compatible with moderate chronic small vessel ischemic disease. Incidental note is made of a cavum septum pellucidum et vergae. There is no evidence of acute cortical infarct, intracranial hemorrhage, mass, midline  shift, or extra-axial fluid collection. Vascular: Calcified atherosclerosis at the skullbase. No hyperdense vessel. Skull: No fracture or focal osseous lesion. Sinuses/Orbits: Small volume fluid in the sphenoid sinuses with calcified material in the left sphenoid sinus, similar to the prior study. Chronic air cell opacification at the mastoid tips bilaterally. Bilateral cataract extraction. Other: Moderate-sized frontal scalp hematoma. CT CERVICAL SPINE FINDINGS Alignment: Unchanged cervical spine straightening. No evidence of traumatic subluxation. Skull base and vertebrae: No evidence of acute fracture or destructive osseous process. Soft tissues and spinal canal: No prevertebral fluid or swelling. No visible canal hematoma. Disc levels: Mild cervical disc degeneration, most notable at C5-6. Mild-to-moderate multilevel cervical facet arthrosis. Upper chest: Mild pleural-parenchymal scarring in the lung apices. Other: Moderate calcified atherosclerosis at the carotid bifurcations. IMPRESSION: 1. No evidence of acute intracranial abnormality. 2. Chronic right PCA infarct. Moderate chronic small vessel ischemic disease. 3. Frontal scalp hematoma. 4. No evidence of acute cervical spine fracture or subluxation. Electronically Signed   By: Freida Busman  Mosetta Putt M.D.   On: 01/17/2017 19:48    Procedures Procedures (including critical care time)  Medications Ordered in ED Medications - No data to display   Initial Impression / Assessment and Plan / ED Course  I have reviewed the triage vital signs and the nursing notes.  Pertinent labs & imaging results that were available during my care of the patient were reviewed by me and considered in my medical decision making (see chart for details).    Pt is an elderly female with hx of COPD, a.fib not on anticoagulation and dementia presenting today with most likely mechanical fall.  Fall was unwitness and pt has memory problems but she remembers falling and denies LOC.   Staff denies unresponsiveness.  Staff states pt at baseline.  signifincant hematoma and ecchymosis over the forehead.  NO other signs of injury.  CT of head and neck pending.  7:56 PM CT neg for acute findings.  Pt will be d/ced home.  Will need to take BP meds when arrives home  Final Clinical Impressions(s) / ED Diagnoses   Final diagnoses:  Fall, initial encounter  Traumatic hematoma of forehead, initial encounter    New Prescriptions New Prescriptions   No medications on file    I personally performed the services described in this documentation, which was scribed in my presence.  The recorded information has been reviewed and considered.    Gwyneth Sprout, MD 01/17/17 1958

## 2017-01-17 NOTE — ED Notes (Signed)
[facility]

## 2017-01-17 NOTE — ED Triage Notes (Signed)
Pt from, [facility], unwitnessed fall in room, hematoma anterior head, no other injuries, pt found face down of ground, dementia pt alert per normal, not on blood thinners, 186/201, h/s of falls and generalized weakness

## 2017-01-17 NOTE — ED Notes (Signed)
Denies wanting pain medication, will give later if pt states that pain is returning

## 2017-01-18 DIAGNOSIS — R296 Repeated falls: Secondary | ICD-10-CM | POA: Diagnosis not present

## 2017-01-18 DIAGNOSIS — S0083XA Contusion of other part of head, initial encounter: Secondary | ICD-10-CM | POA: Diagnosis not present

## 2017-01-18 DIAGNOSIS — I480 Paroxysmal atrial fibrillation: Secondary | ICD-10-CM | POA: Diagnosis not present

## 2017-01-18 DIAGNOSIS — G301 Alzheimer's disease with late onset: Secondary | ICD-10-CM | POA: Diagnosis not present

## 2017-01-21 DIAGNOSIS — R531 Weakness: Secondary | ICD-10-CM | POA: Diagnosis not present

## 2017-02-03 DIAGNOSIS — R269 Unspecified abnormalities of gait and mobility: Secondary | ICD-10-CM | POA: Diagnosis not present

## 2017-02-10 IMAGING — DX DG CHEST 2V
2 series · 2 of 2 positions shown · non-contrast
Comparison: 02/28/2015

CLINICAL DATA: Altered mental status with hypoxia

EXAM:
CHEST  2 VIEW

[chest pa]
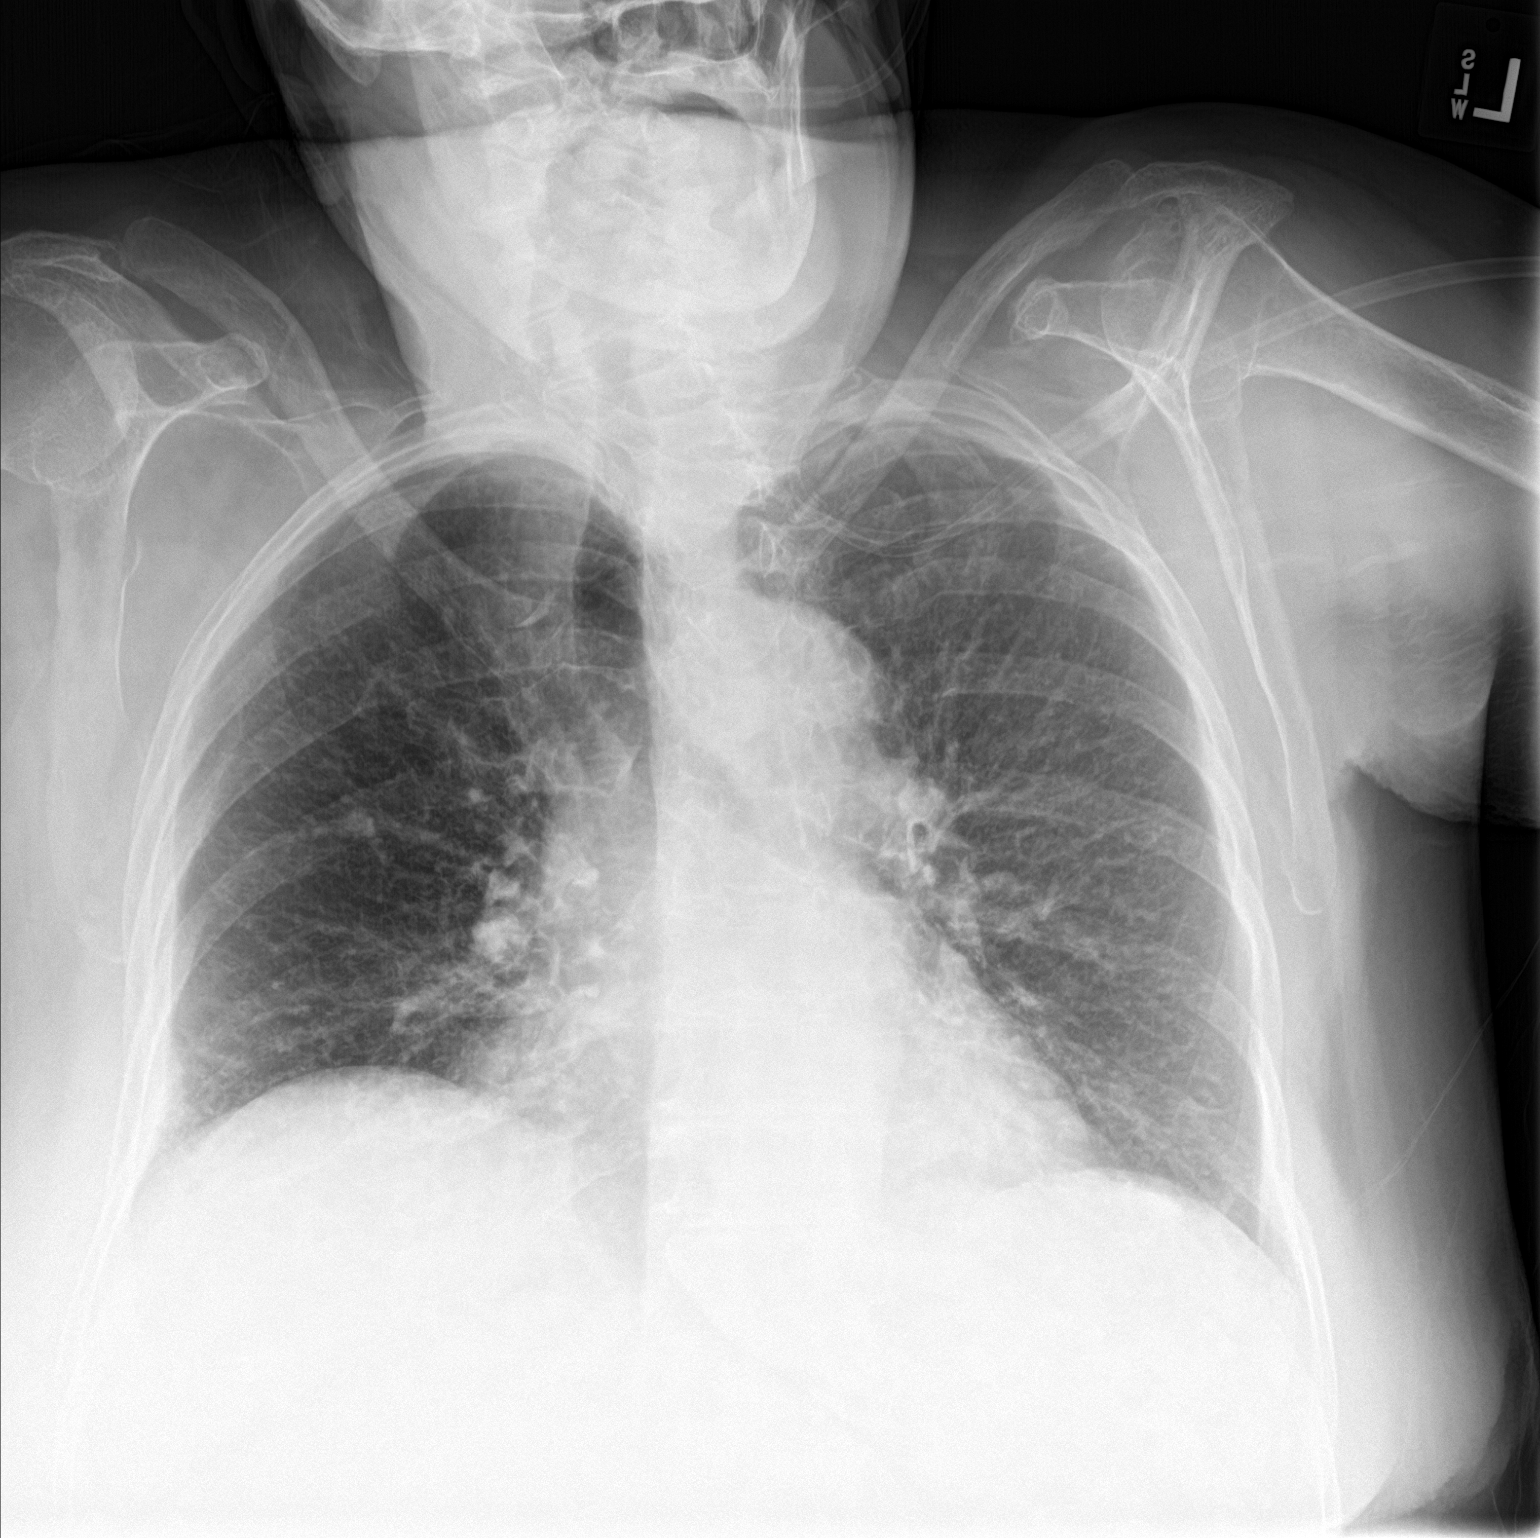

[chest lat]
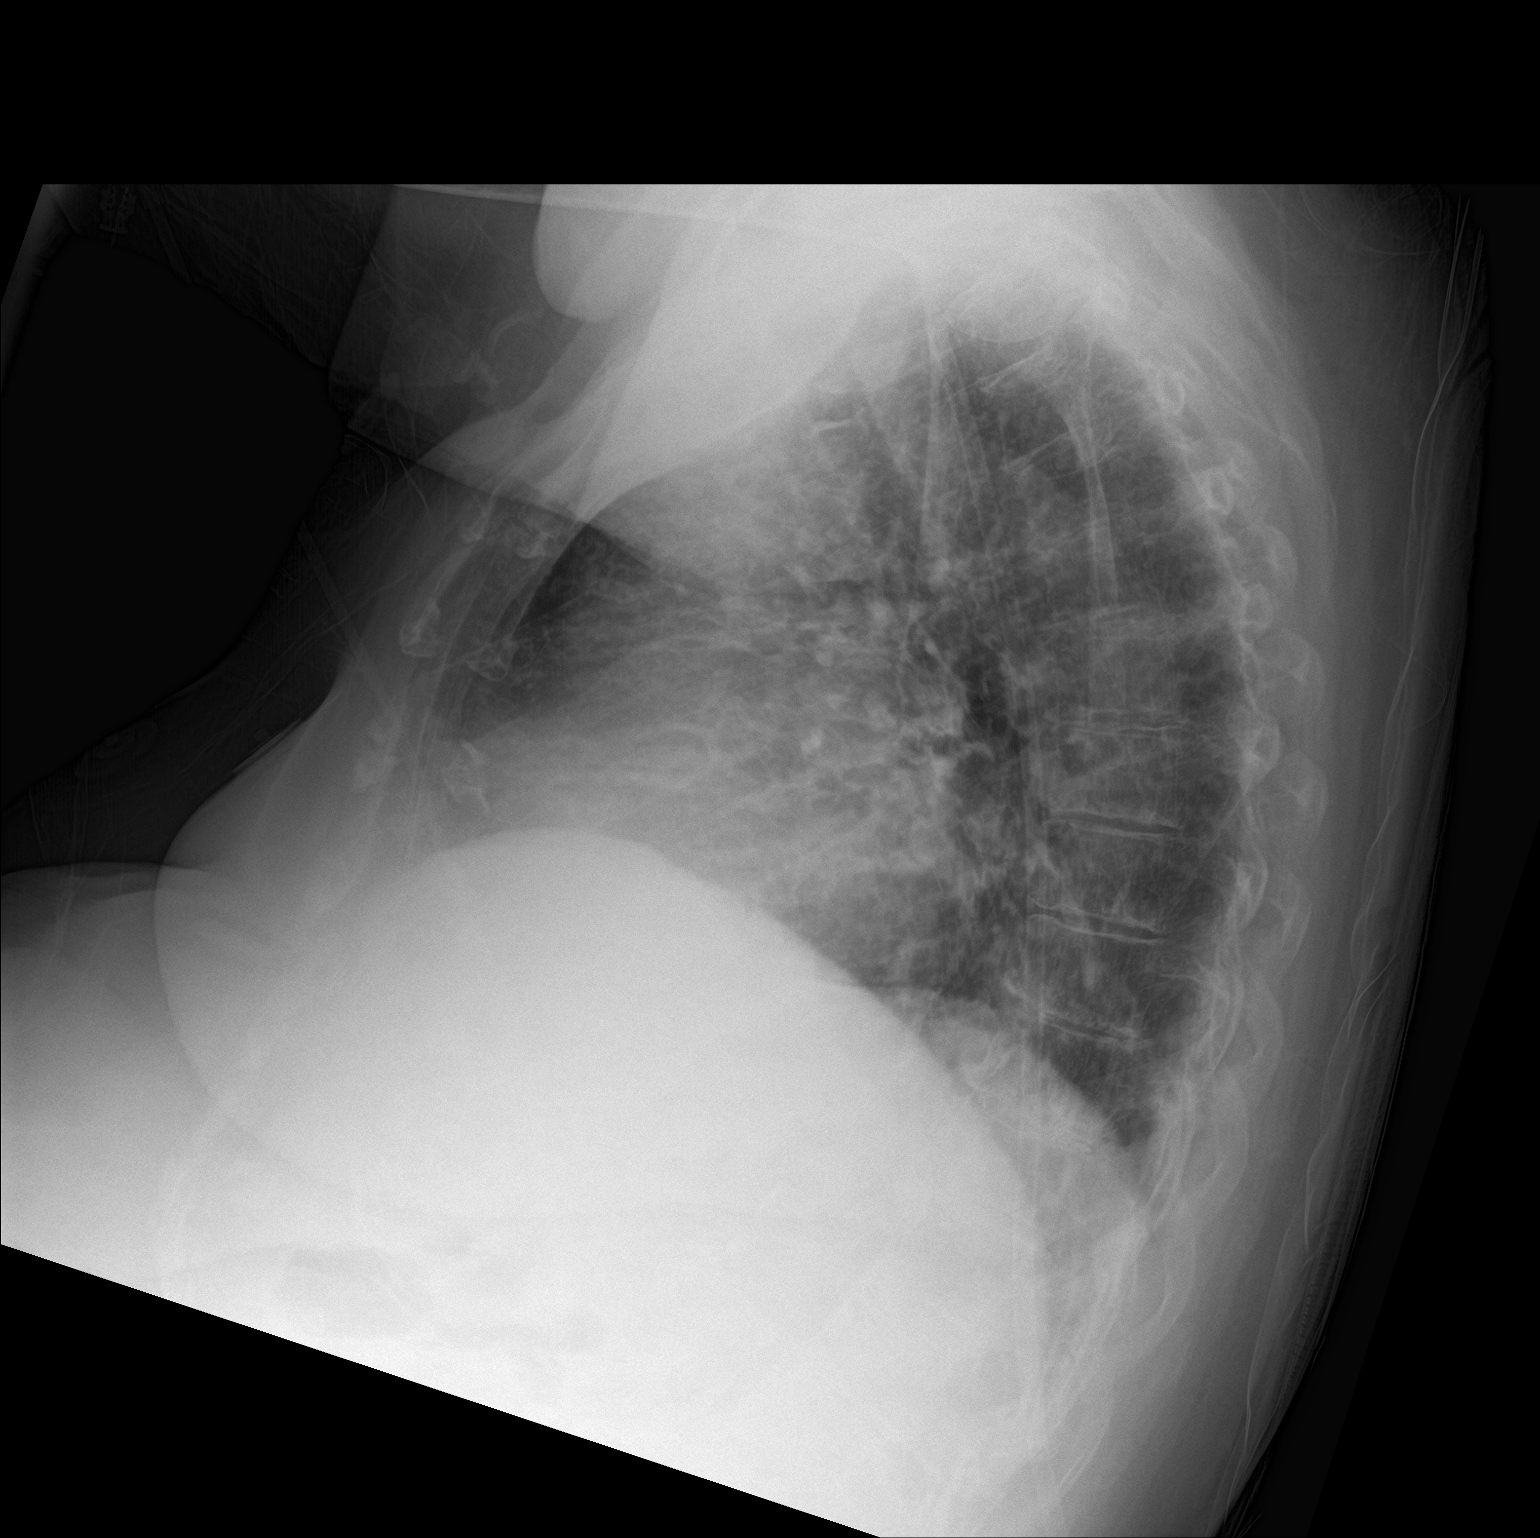

[2 of 2 positions shown; findings below may reference images not displayed]

FINDINGS: Chronic bronchitic markings. Low volume chest without consolidation
or edema. Calcified granuloma over the right chest. Normal heart
size and stable mediastinal contours allowing for distortion by
rightward rotation.
IMPRESSION: Chronic bronchitic markings.  No evidence of acute disease.

## 2017-02-19 DIAGNOSIS — M79675 Pain in left toe(s): Secondary | ICD-10-CM | POA: Diagnosis not present

## 2017-02-19 DIAGNOSIS — B351 Tinea unguium: Secondary | ICD-10-CM | POA: Diagnosis not present

## 2017-03-26 DIAGNOSIS — R531 Weakness: Secondary | ICD-10-CM | POA: Diagnosis not present

## 2017-05-10 DIAGNOSIS — R05 Cough: Secondary | ICD-10-CM | POA: Diagnosis not present

## 2017-05-31 DIAGNOSIS — I480 Paroxysmal atrial fibrillation: Secondary | ICD-10-CM | POA: Diagnosis not present

## 2017-05-31 DIAGNOSIS — G301 Alzheimer's disease with late onset: Secondary | ICD-10-CM | POA: Diagnosis not present

## 2017-05-31 DIAGNOSIS — F419 Anxiety disorder, unspecified: Secondary | ICD-10-CM | POA: Diagnosis not present

## 2017-06-17 DIAGNOSIS — M79675 Pain in left toe(s): Secondary | ICD-10-CM | POA: Diagnosis not present

## 2017-06-17 DIAGNOSIS — M79674 Pain in right toe(s): Secondary | ICD-10-CM | POA: Diagnosis not present

## 2017-06-17 DIAGNOSIS — B351 Tinea unguium: Secondary | ICD-10-CM | POA: Diagnosis not present

## 2017-06-21 DIAGNOSIS — F419 Anxiety disorder, unspecified: Secondary | ICD-10-CM | POA: Diagnosis not present

## 2017-06-21 DIAGNOSIS — I480 Paroxysmal atrial fibrillation: Secondary | ICD-10-CM | POA: Diagnosis not present

## 2017-06-21 DIAGNOSIS — G301 Alzheimer's disease with late onset: Secondary | ICD-10-CM | POA: Diagnosis not present

## 2017-06-21 DIAGNOSIS — I10 Essential (primary) hypertension: Secondary | ICD-10-CM | POA: Diagnosis not present

## 2017-06-22 DIAGNOSIS — I10 Essential (primary) hypertension: Secondary | ICD-10-CM | POA: Diagnosis not present

## 2017-06-22 DIAGNOSIS — D649 Anemia, unspecified: Secondary | ICD-10-CM | POA: Diagnosis not present

## 2017-06-24 DIAGNOSIS — R531 Weakness: Secondary | ICD-10-CM | POA: Diagnosis not present

## 2017-07-14 DIAGNOSIS — Z23 Encounter for immunization: Secondary | ICD-10-CM | POA: Diagnosis not present

## 2017-08-24 DIAGNOSIS — F3489 Other specified persistent mood disorders: Secondary | ICD-10-CM | POA: Diagnosis not present

## 2017-08-24 DIAGNOSIS — I10 Essential (primary) hypertension: Secondary | ICD-10-CM | POA: Diagnosis not present

## 2017-08-24 DIAGNOSIS — E638 Other specified nutritional deficiencies: Secondary | ICD-10-CM | POA: Diagnosis not present

## 2017-08-24 DIAGNOSIS — G308 Other Alzheimer's disease: Secondary | ICD-10-CM | POA: Diagnosis not present

## 2017-08-24 DIAGNOSIS — R531 Weakness: Secondary | ICD-10-CM | POA: Diagnosis not present

## 2017-08-24 DIAGNOSIS — K5901 Slow transit constipation: Secondary | ICD-10-CM | POA: Diagnosis not present

## 2017-08-24 DIAGNOSIS — I4891 Unspecified atrial fibrillation: Secondary | ICD-10-CM | POA: Diagnosis not present

## 2017-08-24 DIAGNOSIS — Z79899 Other long term (current) drug therapy: Secondary | ICD-10-CM | POA: Diagnosis not present

## 2017-08-24 DIAGNOSIS — J449 Chronic obstructive pulmonary disease, unspecified: Secondary | ICD-10-CM | POA: Diagnosis not present

## 2017-08-24 DIAGNOSIS — K210 Gastro-esophageal reflux disease with esophagitis: Secondary | ICD-10-CM | POA: Diagnosis not present

## 2017-09-06 DIAGNOSIS — I739 Peripheral vascular disease, unspecified: Secondary | ICD-10-CM | POA: Diagnosis not present

## 2017-09-06 DIAGNOSIS — E638 Other specified nutritional deficiencies: Secondary | ICD-10-CM | POA: Diagnosis not present

## 2017-09-06 DIAGNOSIS — B351 Tinea unguium: Secondary | ICD-10-CM | POA: Diagnosis not present

## 2017-09-26 DIAGNOSIS — R05 Cough: Secondary | ICD-10-CM | POA: Diagnosis not present

## 2017-09-28 DIAGNOSIS — K5901 Slow transit constipation: Secondary | ICD-10-CM | POA: Diagnosis not present

## 2017-09-28 DIAGNOSIS — G308 Other Alzheimer's disease: Secondary | ICD-10-CM | POA: Diagnosis not present

## 2017-09-28 DIAGNOSIS — F39 Unspecified mood [affective] disorder: Secondary | ICD-10-CM | POA: Diagnosis not present

## 2017-09-28 DIAGNOSIS — Z713 Dietary counseling and surveillance: Secondary | ICD-10-CM | POA: Diagnosis not present

## 2017-09-28 DIAGNOSIS — J069 Acute upper respiratory infection, unspecified: Secondary | ICD-10-CM | POA: Diagnosis not present

## 2017-09-28 DIAGNOSIS — I4891 Unspecified atrial fibrillation: Secondary | ICD-10-CM | POA: Diagnosis not present

## 2017-09-28 DIAGNOSIS — E8881 Metabolic syndrome: Secondary | ICD-10-CM | POA: Diagnosis not present

## 2017-09-28 DIAGNOSIS — I10 Essential (primary) hypertension: Secondary | ICD-10-CM | POA: Diagnosis not present

## 2017-09-30 ENCOUNTER — Other Ambulatory Visit: Payer: Self-pay

## 2017-09-30 ENCOUNTER — Emergency Department (HOSPITAL_COMMUNITY): Payer: Medicare Other

## 2017-09-30 ENCOUNTER — Inpatient Hospital Stay (HOSPITAL_COMMUNITY)
Admission: EM | Admit: 2017-09-30 | Discharge: 2017-10-10 | DRG: 177 | Disposition: A | Discharge status: Home Health | Payer: Medicare Other | Attending: Internal Medicine | Admitting: Internal Medicine

## 2017-09-30 ENCOUNTER — Encounter (HOSPITAL_COMMUNITY): Payer: Self-pay

## 2017-09-30 DIAGNOSIS — Z6830 Body mass index (BMI) 30.0-30.9, adult: Secondary | ICD-10-CM

## 2017-09-30 DIAGNOSIS — E876 Hypokalemia: Secondary | ICD-10-CM | POA: Diagnosis present

## 2017-09-30 DIAGNOSIS — Y95 Nosocomial condition: Secondary | ICD-10-CM | POA: Diagnosis present

## 2017-09-30 DIAGNOSIS — J189 Pneumonia, unspecified organism: Secondary | ICD-10-CM | POA: Diagnosis not present

## 2017-09-30 DIAGNOSIS — R532 Functional quadriplegia: Secondary | ICD-10-CM | POA: Diagnosis present

## 2017-09-30 DIAGNOSIS — Z66 Do not resuscitate: Secondary | ICD-10-CM | POA: Diagnosis present

## 2017-09-30 DIAGNOSIS — R8290 Unspecified abnormal findings in urine: Secondary | ICD-10-CM | POA: Diagnosis present

## 2017-09-30 DIAGNOSIS — Z993 Dependence on wheelchair: Secondary | ICD-10-CM

## 2017-09-30 DIAGNOSIS — J9601 Acute respiratory failure with hypoxia: Secondary | ICD-10-CM | POA: Diagnosis present

## 2017-09-30 DIAGNOSIS — R05 Cough: Secondary | ICD-10-CM | POA: Diagnosis not present

## 2017-09-30 DIAGNOSIS — J849 Interstitial pulmonary disease, unspecified: Secondary | ICD-10-CM | POA: Diagnosis present

## 2017-09-30 DIAGNOSIS — Z7982 Long term (current) use of aspirin: Secondary | ICD-10-CM | POA: Diagnosis not present

## 2017-09-30 DIAGNOSIS — R0902 Hypoxemia: Secondary | ICD-10-CM | POA: Diagnosis present

## 2017-09-30 DIAGNOSIS — Z7189 Other specified counseling: Secondary | ICD-10-CM | POA: Diagnosis not present

## 2017-09-30 DIAGNOSIS — I10 Essential (primary) hypertension: Secondary | ICD-10-CM | POA: Diagnosis present

## 2017-09-30 DIAGNOSIS — F03918 Unspecified dementia, unspecified severity, with other behavioral disturbance: Secondary | ICD-10-CM | POA: Diagnosis present

## 2017-09-30 DIAGNOSIS — J156 Pneumonia due to other aerobic Gram-negative bacteria: Secondary | ICD-10-CM | POA: Diagnosis present

## 2017-09-30 DIAGNOSIS — Z7401 Bed confinement status: Secondary | ICD-10-CM

## 2017-09-30 DIAGNOSIS — R4020 Unspecified coma: Secondary | ICD-10-CM | POA: Diagnosis not present

## 2017-09-30 DIAGNOSIS — R509 Fever, unspecified: Secondary | ICD-10-CM | POA: Diagnosis not present

## 2017-09-30 DIAGNOSIS — J690 Pneumonitis due to inhalation of food and vomit: Secondary | ICD-10-CM | POA: Diagnosis present

## 2017-09-30 DIAGNOSIS — R1310 Dysphagia, unspecified: Secondary | ICD-10-CM | POA: Diagnosis present

## 2017-09-30 DIAGNOSIS — I480 Paroxysmal atrial fibrillation: Secondary | ICD-10-CM | POA: Diagnosis present

## 2017-09-30 DIAGNOSIS — A419 Sepsis, unspecified organism: Secondary | ICD-10-CM

## 2017-09-30 DIAGNOSIS — R918 Other nonspecific abnormal finding of lung field: Secondary | ICD-10-CM | POA: Diagnosis not present

## 2017-09-30 DIAGNOSIS — I4892 Unspecified atrial flutter: Secondary | ICD-10-CM | POA: Diagnosis present

## 2017-09-30 DIAGNOSIS — F0391 Unspecified dementia with behavioral disturbance: Secondary | ICD-10-CM | POA: Diagnosis present

## 2017-09-30 DIAGNOSIS — B9789 Other viral agents as the cause of diseases classified elsewhere: Secondary | ICD-10-CM | POA: Diagnosis present

## 2017-09-30 DIAGNOSIS — E668 Other obesity: Secondary | ICD-10-CM | POA: Diagnosis present

## 2017-09-30 DIAGNOSIS — Z515 Encounter for palliative care: Secondary | ICD-10-CM | POA: Diagnosis not present

## 2017-09-30 DIAGNOSIS — J9600 Acute respiratory failure, unspecified whether with hypoxia or hypercapnia: Secondary | ICD-10-CM | POA: Diagnosis not present

## 2017-09-30 DIAGNOSIS — R4182 Altered mental status, unspecified: Secondary | ICD-10-CM | POA: Diagnosis not present

## 2017-09-30 DIAGNOSIS — Z09 Encounter for follow-up examination after completed treatment for conditions other than malignant neoplasm: Secondary | ICD-10-CM

## 2017-09-30 LAB — INFLUENZA PANEL BY PCR (TYPE A & B)
INFLBPCR: NEGATIVE
Influenza A By PCR: NEGATIVE

## 2017-09-30 LAB — URINALYSIS, COMPLETE (UACMP) WITH MICROSCOPIC
Glucose, UA: NEGATIVE mg/dL
Hgb urine dipstick: NEGATIVE
KETONES UR: NEGATIVE mg/dL
Nitrite: NEGATIVE
PH: 5 (ref 5.0–8.0)
PROTEIN: 30 mg/dL — AB
Specific Gravity, Urine: 1.032 — ABNORMAL HIGH (ref 1.005–1.030)

## 2017-09-30 LAB — CBC WITH DIFFERENTIAL/PLATELET
Basophils Absolute: 0 10*3/uL (ref 0.0–0.1)
Basophils Relative: 0 %
Eosinophils Absolute: 0.1 10*3/uL (ref 0.0–0.7)
Eosinophils Relative: 1 %
HCT: 41.3 % (ref 36.0–46.0)
HEMOGLOBIN: 13.3 g/dL (ref 12.0–15.0)
LYMPHS ABS: 1.8 10*3/uL (ref 0.7–4.0)
LYMPHS PCT: 22 %
MCH: 30.5 pg (ref 26.0–34.0)
MCHC: 32.2 g/dL (ref 30.0–36.0)
MCV: 94.7 fL (ref 78.0–100.0)
Monocytes Absolute: 1.2 10*3/uL — ABNORMAL HIGH (ref 0.1–1.0)
Monocytes Relative: 14 %
NEUTROS PCT: 63 %
Neutro Abs: 5.3 10*3/uL (ref 1.7–7.7)
Platelets: 255 10*3/uL (ref 150–400)
RBC: 4.36 MIL/uL (ref 3.87–5.11)
RDW: 13.7 % (ref 11.5–15.5)
WBC: 8.4 10*3/uL (ref 4.0–10.5)

## 2017-09-30 LAB — PROTIME-INR
INR: 1
PROTHROMBIN TIME: 13.1 s (ref 11.4–15.2)

## 2017-09-30 LAB — EXPECTORATED SPUTUM ASSESSMENT W REFEX TO RESP CULTURE

## 2017-09-30 LAB — COMPREHENSIVE METABOLIC PANEL
ALT: 9 U/L — ABNORMAL LOW (ref 14–54)
ANION GAP: 6 (ref 5–15)
AST: 16 U/L (ref 15–41)
Albumin: 3.1 g/dL — ABNORMAL LOW (ref 3.5–5.0)
Alkaline Phosphatase: 57 U/L (ref 38–126)
BUN: 18 mg/dL (ref 6–20)
CHLORIDE: 102 mmol/L (ref 101–111)
CO2: 30 mmol/L (ref 22–32)
Calcium: 8.7 mg/dL — ABNORMAL LOW (ref 8.9–10.3)
Creatinine, Ser: 0.92 mg/dL (ref 0.44–1.00)
GFR calc Af Amer: >60 mL/min (ref >60)
GFR calc non Af Amer: 54 mL/min — ABNORMAL LOW (ref >60)
Glucose, Bld: 112 mg/dL — ABNORMAL HIGH (ref 65–99)
Potassium: 3.5 mmol/L (ref 3.5–5.1)
Sodium: 138 mmol/L (ref 135–145)
Total Bilirubin: 1 mg/dL (ref 0.3–1.2)
Total Protein: 7.3 g/dL (ref 6.5–8.1)

## 2017-09-30 LAB — EXPECTORATED SPUTUM ASSESSMENT W GRAM STAIN, RFLX TO RESP C

## 2017-09-30 LAB — I-STAT CG4 LACTIC ACID, ED: Lactic Acid, Venous: 0.79 mmol/L (ref 0.5–1.9)

## 2017-09-30 LAB — MRSA PCR SCREENING: MRSA BY PCR: NEGATIVE

## 2017-09-30 MED ORDER — PAROXETINE HCL 20 MG PO TABS
10.0000 mg | ORAL_TABLET | Freq: Every day | ORAL | Status: DC
  Administered 2017-10-01 – 2017-10-07 (×7): 10 mg via ORAL
  Filled 2017-09-30 (×3): qty 1
  Filled 2017-09-30: qty 0.5
  Filled 2017-09-30 (×3): qty 1

## 2017-09-30 MED ORDER — SODIUM CHLORIDE 0.9 % IV SOLN
INTRAVENOUS | Status: DC
  Administered 2017-09-30 – 2017-10-09 (×10): via INTRAVENOUS

## 2017-09-30 MED ORDER — GUAIFENESIN 100 MG/5ML PO SYRP
200.0000 mg | ORAL_SOLUTION | Freq: Four times a day (QID) | ORAL | Status: DC | PRN
  Filled 2017-09-30 (×3): qty 10

## 2017-09-30 MED ORDER — ENOXAPARIN SODIUM 40 MG/0.4ML ~~LOC~~ SOLN
40.0000 mg | SUBCUTANEOUS | Status: DC
  Administered 2017-10-01: 40 mg via SUBCUTANEOUS
  Filled 2017-09-30: qty 0.4

## 2017-09-30 MED ORDER — GUAIFENESIN ER 600 MG PO TB12
600.0000 mg | ORAL_TABLET | Freq: Two times a day (BID) | ORAL | Status: DC
  Administered 2017-09-30 – 2017-10-03 (×7): 600 mg via ORAL
  Filled 2017-09-30 (×7): qty 1

## 2017-09-30 MED ORDER — LEVALBUTEROL HCL 0.63 MG/3ML IN NEBU: 0.6300 mg | INHALATION_SOLUTION | Freq: Three times a day (TID) | RESPIRATORY_TRACT | Status: DC

## 2017-09-30 MED ORDER — IPRATROPIUM BROMIDE 0.02 % IN SOLN
0.5000 mg | Freq: Four times a day (QID) | RESPIRATORY_TRACT | Status: DC
  Administered 2017-09-30 – 2017-10-01 (×4): 0.5 mg via RESPIRATORY_TRACT
  Filled 2017-09-30 (×5): qty 2.5

## 2017-09-30 MED ORDER — LEVALBUTEROL HCL 0.63 MG/3ML IN NEBU
0.6300 mg | INHALATION_SOLUTION | Freq: Four times a day (QID) | RESPIRATORY_TRACT | Status: DC
  Administered 2017-09-30 – 2017-10-01 (×4): 0.63 mg via RESPIRATORY_TRACT
  Filled 2017-09-30 (×5): qty 3

## 2017-09-30 MED ORDER — SODIUM CHLORIDE 0.9 % IV BOLUS (SEPSIS)
1000.0000 mL | Freq: Once | INTRAVENOUS | Status: AC
  Administered 2017-09-30: 1000 mL via INTRAVENOUS

## 2017-09-30 MED ORDER — LEVALBUTEROL HCL 1.25 MG/0.5ML IN NEBU
1.2500 mg | INHALATION_SOLUTION | RESPIRATORY_TRACT | Status: DC | PRN
  Administered 2017-10-04: 1.25 mg via RESPIRATORY_TRACT
  Filled 2017-09-30: qty 0.5

## 2017-09-30 MED ORDER — SODIUM CHLORIDE 0.9 % IV BOLUS (SEPSIS)
250.0000 mL | Freq: Once | INTRAVENOUS | Status: AC
  Administered 2017-09-30: 250 mL via INTRAVENOUS

## 2017-09-30 MED ORDER — BENZONATATE 100 MG PO CAPS
100.0000 mg | ORAL_CAPSULE | Freq: Every day | ORAL | Status: DC
  Administered 2017-10-01 – 2017-10-03 (×3): 100 mg via ORAL
  Filled 2017-09-30 (×3): qty 1

## 2017-09-30 MED ORDER — DEXTROSE 5 % IV SOLN
2.0000 g | Freq: Once | INTRAVENOUS | Status: AC
  Administered 2017-09-30: 2 g via INTRAVENOUS
  Filled 2017-09-30: qty 2

## 2017-09-30 MED ORDER — PANTOPRAZOLE SODIUM 40 MG PO TBEC
40.0000 mg | DELAYED_RELEASE_TABLET | Freq: Every day | ORAL | Status: DC
  Administered 2017-10-01 – 2017-10-05 (×4): 40 mg via ORAL
  Filled 2017-09-30 (×5): qty 1

## 2017-09-30 MED ORDER — VANCOMYCIN HCL IN DEXTROSE 1-5 GM/200ML-% IV SOLN
1000.0000 mg | Freq: Once | INTRAVENOUS | Status: AC
  Administered 2017-09-30: 1000 mg via INTRAVENOUS
  Filled 2017-09-30: qty 200

## 2017-09-30 MED ORDER — ALUM & MAG HYDROXIDE-SIMETH 200-200-20 MG/5ML PO SUSP: 30.0000 mL | Freq: Four times a day (QID) | ORAL | Status: DC | PRN

## 2017-09-30 MED ORDER — IPRATROPIUM BROMIDE 0.02 % IN SOLN: 0.5000 mg | RESPIRATORY_TRACT | Status: DC | PRN

## 2017-09-30 MED ORDER — DEXTROSE 5 % IV SOLN
1.0000 g | Freq: Two times a day (BID) | INTRAVENOUS | Status: DC
  Administered 2017-10-01 – 2017-10-05 (×10): 1 g via INTRAVENOUS
  Filled 2017-09-30 (×11): qty 1

## 2017-09-30 MED ORDER — ASPIRIN 325 MG PO TABS
325.0000 mg | ORAL_TABLET | Freq: Every day | ORAL | Status: DC
  Administered 2017-10-01 – 2017-10-09 (×8): 325 mg via ORAL
  Filled 2017-09-30 (×10): qty 1

## 2017-09-30 MED ORDER — ALPRAZOLAM 0.5 MG PO TABS
0.5000 mg | ORAL_TABLET | Freq: Three times a day (TID) | ORAL | Status: DC | PRN
  Administered 2017-10-02 – 2017-10-09 (×5): 0.5 mg via ORAL
  Filled 2017-09-30 (×5): qty 1

## 2017-09-30 MED ORDER — VANCOMYCIN HCL IN DEXTROSE 750-5 MG/150ML-% IV SOLN
750.0000 mg | INTRAVENOUS | Status: DC
  Administered 2017-10-01 – 2017-10-03 (×3): 750 mg via INTRAVENOUS
  Filled 2017-09-30 (×3): qty 150

## 2017-09-30 MED ORDER — METOPROLOL TARTRATE 25 MG PO TABS
50.0000 mg | ORAL_TABLET | Freq: Two times a day (BID) | ORAL | Status: DC
  Administered 2017-09-30 – 2017-10-04 (×8): 50 mg via ORAL
  Filled 2017-09-30: qty 2
  Filled 2017-09-30: qty 1
  Filled 2017-09-30: qty 2
  Filled 2017-09-30 (×3): qty 1
  Filled 2017-09-30: qty 2
  Filled 2017-09-30: qty 1

## 2017-09-30 NOTE — ED Notes (Signed)
Pt has been placed on pure wick at this time. 

## 2017-09-30 NOTE — ED Notes (Signed)
ED TO INPATIENT HANDOFF REPORT  Name/Age/Gender Brooke Phelps 83 y.o. female  Code Status    Code Status Orders  (From admission, onward)        Start     Ordered   09/30/17 1706  Do not attempt resuscitation/DNR  Continuous    Question Answer Comment  In the event of cardiac or respiratory ARREST Do not call a "code blue"   In the event of cardiac or respiratory ARREST Do not perform Intubation, CPR, defibrillation or ACLS   In the event of cardiac or respiratory ARREST Use medication by any route, position, wound care, and other measures to relive pain and suffering. May use oxygen, suction and manual treatment of airway obstruction as needed for comfort.      09/30/17 1705    Code Status History    Date Active Date Inactive Code Status Order ID Comments User Context   03/12/2016 01:46 03/13/2016 21:23 DNR [id number]  Rise Patience, MD Inpatient   11/11/2013 13:02 11/14/2013 18:54 Full Code [id number]  Newt Minion, MD Inpatient   11/11/2013 02:00 11/11/2013 13:02 DNR [id number]  Orvan Falconer, MD Inpatient      Home/SNF/Other Nursing Home  Chief Complaint fever   Level of Care/Admitting Diagnosis ED Disposition    ED Disposition Condition [facility] Area: [facility] [[id number]]  Level of Care: Telemetry [5]  Admit to tele based on following criteria: Complex arrhythmia (Bradycardia/Tachycardia)  Diagnosis: HCAP (healthcare-associated pneumonia) [599357]  Admitting Physician: Robbie Lis [phone]  Attending Physician: Robbie Lis [phone]  Estimated length of stay: 3 - 4 days  Certification:: I certify this patient will need inpatient services for at least 2 midnights  PT Class (Do Not Modify): Inpatient [101]  PT Acc Code (Do Not Modify): Private [1]       Medical History Past Medical History:  Diagnosis Date  . Asthma   . Atrial fibrillation ([place])   . COPD (chronic obstructive pulmonary disease) ([place])   . Dementia      Allergies No Known Allergies  IV Location/Drains/Wounds Patient Lines/Drains/Airways Status   Active Line/Drains/Airways    Name:   Placement date:   Placement time:   Site:   Days:   Peripheral IV 09/30/17 Right;Posterior Forearm   09/30/17    1453    Forearm   less than 1   Peripheral IV 09/30/17 Left Antecubital   09/30/17    1510    Antecubital   less than 1          Labs/Imaging Results for orders placed or performed during the hospital encounter of 09/30/17 (from the past 48 hour(s))  Comprehensive metabolic panel     Status: Abnormal   Collection Time: 09/30/17  2:00 PM  Result Value Ref Range   Sodium 138 135 - 145 mmol/L   Potassium 3.5 3.5 - 5.1 mmol/L   Chloride 102 101 - 111 mmol/L   CO2 30 22 - 32 mmol/L   Glucose, Bld 112 (H) 65 - 99 mg/dL   BUN 18 6 - 20 mg/dL   Creatinine, Ser 0.92 0.44 - 1.00 mg/dL   Calcium 8.7 (L) 8.9 - 10.3 mg/dL   Total Protein 7.3 6.5 - 8.1 g/dL   Albumin 3.1 (L) 3.5 - 5.0 g/dL   AST 16 15 - 41 U/L   ALT 9 (L) 14 - 54 U/L   Alkaline Phosphatase 57 38 - 126 U/L   Total Bilirubin 1.0  0.3 - 1.2 mg/dL   GFR calc non Af Amer 54 (L) >60 mL/min   GFR calc Af Amer >60 >60 mL/min    Comment: (NOTE) The eGFR has been calculated using the CKD EPI equation. This calculation has not been validated in all clinical situations. eGFR's persistently <60 mL/min signify possible Chronic Kidney Disease.    Anion gap 6 5 - 15  CBC with Differential     Status: Abnormal   Collection Time: 09/30/17  2:00 PM  Result Value Ref Range   WBC 8.4 4.0 - 10.5 K/uL   RBC 4.36 3.87 - 5.11 MIL/uL   Hemoglobin 13.3 12.0 - 15.0 g/dL   HCT 41.3 36.0 - 46.0 %   MCV 94.7 78.0 - 100.0 fL   MCH 30.5 26.0 - 34.0 pg   MCHC 32.2 30.0 - 36.0 g/dL   RDW 13.7 11.5 - 15.5 %   Platelets 255 150 - 400 K/uL   Neutrophils Relative % 63 %   Neutro Abs 5.3 1.7 - 7.7 K/uL   Lymphocytes Relative 22 %   Lymphs Abs 1.8 0.7 - 4.0 K/uL   Monocytes Relative 14 %   Monocytes  Absolute 1.2 (H) 0.1 - 1.0 K/uL   Eosinophils Relative 1 %   Eosinophils Absolute 0.1 0.0 - 0.7 K/uL   Basophils Relative 0 %   Basophils Absolute 0.0 0.0 - 0.1 K/uL  Protime-INR     Status: None   Collection Time: 09/30/17  3:00 PM  Result Value Ref Range   Prothrombin Time 13.1 11.4 - 15.2 seconds   INR 1.00   I-Stat CG4 Lactic Acid, ED     Status: None   Collection Time: 09/30/17  3:08 PM  Result Value Ref Range   Lactic Acid, Venous 0.79 0.5 - 1.9 mmol/L   Dg Chest 2 View  Result Date: 09/30/2017 CLINICAL DATA:  Suspected sepsis. EXAM: CHEST  2 VIEW COMPARISON:  05/05/2016 FINDINGS: The cardiomediastinal silhouette is unchanged allowing for rightward patient rotation. Chronic interstitial coarsening is unchanged. There are new patchy airspace opacities in the lateral right mid lung and right lung base. No sizable pleural effusion or pneumothorax is identified. No acute osseous abnormality is seen. IMPRESSION: 1. Patchy right lung opacities concerning for pneumonia. 2. Chronic interstitial lung disease. Electronically Signed   By: Logan Bores M.D.   On: 09/30/2017 16:04    Pending Labs Unresulted Labs (From admission, onward)   Start     Ordered   09/30/17 1412  Urine culture  STAT,   STAT     09/30/17 1411   09/30/17 1412  Urinalysis, Complete w Microscopic  STAT,   STAT     09/30/17 1411   09/30/17 1412  Influenza panel by PCR (type A & B)  (Influenza PCR Panel)  Once,   R     09/30/17 1411   09/30/17 1400  Culture, blood (Routine x 2)  BLOOD CULTURE X 2,   STAT     09/30/17 1359   09/30/17 1400  Urinalysis, Routine w reflex microscopic  STAT,   STAT     09/30/17 1359   Signed and Held  Culture, blood (routine x 2) Call MD if unable to obtain prior to antibiotics being given  BLOOD CULTURE X 2,   R    Comments:  If blood cultures drawn in Emergency Department - Do not draw and cancel order    Signed and Held   Signed and Held  Culture, sputum-assessment  Once,   R      Signed and Held   Signed and Held  Gram stain  Once,   R     Signed and Held   Signed and Held  Strep pneumoniae urinary antigen  Once,   R     Signed and Held   Signed and Held  CBC  (enoxaparin (LOVENOX)    CrCl >/= 30 ml/min)  Once,   R    Comments:  Baseline for enoxaparin therapy IF NOT ALREADY DRAWN.  Notify MD if PLT < 100 K.    Signed and Held   Signed and Held  Creatinine, serum  (enoxaparin (LOVENOX)    CrCl >/= 30 ml/min)  Once,   R    Comments:  Baseline for enoxaparin therapy IF NOT ALREADY DRAWN.    Signed and Held   Signed and Held  Creatinine, serum  (enoxaparin (LOVENOX)    CrCl >/= 30 ml/min)  Weekly,   R    Comments:  while on enoxaparin therapy    Signed and Held   Signed and Held  Respiratory Panel by PCR  (Respiratory virus panel)  Once,   R     Signed and Held   Signed and Held  Legionella Pneumophila Serogp 1 Ur Ag  Once,   R     Signed and Held   Signed and Held  Basic metabolic panel  Tomorrow morning,   R     Signed and Held   Signed and Held  CBC  Tomorrow morning,   R     Signed and Held      Vitals/Pain Today's Vitals   09/30/17 1353 09/30/17 1506 09/30/17 1530 09/30/17 1615  BP:  (!) 147/64 (!) 153/67 (!) 156/65  Pulse:  60 63 71  Resp:  17 (!) 28 18  Temp:      TempSrc:      SpO2: 94% 92% 99% 92%    Isolation Precautions Droplet precaution  Medications Medications  sodium chloride 0.9 % bolus 1,000 mL (0 mLs Intravenous Stopped 09/30/17 1559)    And  sodium chloride 0.9 % bolus 1,000 mL (0 mLs Intravenous Stopped 09/30/17 1559)    And  sodium chloride 0.9 % bolus 250 mL (not administered)  ceFEPIme (MAXIPIME) 2 g in dextrose 5 % 50 mL IVPB (0 g Intravenous Stopped 09/30/17 1706)  vancomycin (VANCOCIN) IVPB 1000 mg/200 mL premix (0 mg Intravenous Stopped 09/30/17 1706)    Mobility non-ambulatory

## 2017-09-30 NOTE — ED Notes (Signed)
Patient back from radiology.

## 2017-09-30 NOTE — Progress Notes (Signed)
A consult was received from an ED physician for vanc and cefepime per pharmacy dosing.  The patient's profile has been reviewed for ht/wt/allergies/indication/available labs.   A one time order has been placed for vanc and cefepime.  Further antibiotics/pharmacy consults should be ordered by admitting physician if indicated.                       Thank you, Arley Phenixllen Deshante Cassell RPh 09/30/2017, 2:13 PM Pager [phone]

## 2017-09-30 NOTE — ED Triage Notes (Addendum)
[facility] with c/o fever and chest congestion x3 days. Patient lives in dementia unit and patient is AxO to self in triage. Patient has been treated with prednisone, tylenol, and albuterol without relief. Patient had cxr completed and was negative for Pneumonia/CHF. Per staff, the highest temp was 102.48F orally 2 days ago. Patient's last dose of Tylenol and albuterol was at 1200 (unknown dose.) Patient denies nausea/vomiting/diarrhea. Renette ButtersGolden ticket DNR at patient bedside.

## 2017-09-30 NOTE — ED Notes (Signed)
Patient transported to radiology dept.  

## 2017-09-30 NOTE — Progress Notes (Signed)
Pharmacy Antibiotic Note  Brooke Phelps is a 83 y.o. female admitted on 09/30/2017 with pneumonia.  Pharmacy has been consulted for Vancomycin dosing; cefepime per MD  Plan:  Vancomycin 1000 mg IV already given in ED, then 750 mg IV q24 hr (est AUC 492 based on SCr 0.92 and IBW for CrCl)  Measure vancomycin AUC at steady state as indicated  Will adjust cefepime to 1g IV q12 hr for CrCl 30-50 ml/min Follow clinical course, renal function, culture results as available Follow for de-escalation of antibiotics and LOT     Temp (24hrs), Avg:100.1 F (37.8 C), Min:98.8 F (37.1 C), Max:101.6 F (38.7 C)  Recent Labs  Lab 09/30/17 1400 09/30/17 1508  WBC 8.4  --   CREATININE 0.92  --   LATICACIDVEN  --  0.79    CrCl cannot be calculated (Unknown ideal weight.).    No Known Allergies   Thank you for allowing pharmacy to be a part of this patient's care.  Brooke Phelps, PharmD, BCPS Pager: [phone] 09/30/2017, 7:33 PM

## 2017-09-30 NOTE — ED Provider Notes (Signed)
[facility] Provider Note   CSN: [id number] Arrival date & time: 09/30/17  1316     History   Chief Complaint Chief Complaint  Patient presents with  . Fever  . Cough    HPI Brooke Phelps is a 83 y.o. female.  HPI   83yo female with history of dementia, atrial fibrillation, COPD presents with concern for cough and fever.  History is limited by patient's dementia, she is unable to provide history. History from EMS and facility. Level V Caveat.  Facility reports patient with fever to 102.8. They noted fever and chest congestion for 3 days.  Lives in dementia unit. Was given prednisone, tylenol, albuterol without relief.     Past Medical History:  Diagnosis Date  . Asthma   . Atrial fibrillation (HCC)   . COPD (chronic obstructive pulmonary disease) (HCC)   . Dementia     Patient Active Problem List   Diagnosis Date Noted  . HCAP (healthcare-associated pneumonia) 09/30/2017  . Acute encephalopathy 03/12/2016  . UTI (lower urinary tract infection) 03/12/2016  . Acute respiratory failure with hypoxia (HCC) 03/12/2016  . Altered mental status 03/11/2016  . Atrial flutter (HCC) 11/12/2013  . Hypoxia 11/11/2013  . Dementia with behavioral disturbance 11/11/2013  . ILD (interstitial lung disease) (HCC) 11/11/2013  . Bimalleolar ankle fracture 11/10/2013    Past Surgical History:  Procedure Laterality Date  . ORIF ANKLE FRACTURE Left 11/11/2013   Procedure: OPEN REDUCTION INTERNAL FIXATION (ORIF) ANKLE FRACTURE;  Surgeon: Nadara Mustard, MD;  Location: [facility] OR;  Service: Orthopedics;  Laterality: Left;    OB History    No data available       Home Medications    Prior to Admission medications   Medication Sig Start Date End Date Taking? Authorizing Provider  acetaminophen (TYLENOL) 500 MG tablet Take 2 tablets (1,000 mg total) by mouth 3 (three) times daily. Patient taking differently: Take 500 mg by mouth every 4 (four)  hours as needed for mild pain.  11/14/13  Yes Ghimire, Werner Lean, MD  ALPRAZolam Prudy Feeler) 0.5 MG tablet Take 1 tablet (0.5 mg total) by mouth every 8 (eight) hours as needed for anxiety. Patient taking differently: Take 0.5 mg by mouth 2 (two) times daily.  11/14/13  Yes Ghimire, Werner Lean, MD  alum & mag hydroxide-simeth (MINTOX) 200-200-20 MG/5ML suspension Take 30 mLs by mouth every 6 (six) hours as needed for indigestion or heartburn.   Yes [provider]  aspirin 325 MG tablet Take 1 tablet (325 mg total) by mouth daily. 11/14/13  Yes Ghimire, Werner Lean, MD  benzonatate (TESSALON) 100 MG capsule Take 100 mg by mouth daily.   Yes [provider]  docusate sodium 100 MG CAPS Take 100 mg by mouth 2 (two) times daily. Patient taking differently: Take 100 mg by mouth daily.  11/14/13  Yes Ghimire, Werner Lean, MD  guaiFENesin (MUCINEX) 600 MG 12 hr tablet Take 600 mg by mouth 2 (two) times daily.   Yes [provider]  guaifenesin (ROBITUSSIN) 100 MG/5ML syrup Take 200 mg by mouth 4 (four) times daily as needed for cough.   Yes [provider]  levalbuterol (XOPENEX) 0.63 MG/3ML nebulizer solution Take 3 mLs (0.63 mg total) by nebulization 3 (three) times daily. Patient taking differently: Take 0.63 mg by nebulization every 4 (four) hours as needed for wheezing.  11/14/13  Yes Ghimire, Werner Lean, MD  loperamide (IMODIUM) 2 MG capsule Take 2  mg by mouth as needed for diarrhea or loose stools.   Yes [provider]  magnesium hydroxide (MILK OF MAGNESIA) 400 MG/5ML suspension Take 30 mLs by mouth daily as needed for mild constipation.   Yes [provider]  metoprolol (LOPRESSOR) 50 MG tablet Take 1 tablet (50 mg total) by mouth 2 (two) times daily. 11/14/13  Yes Ghimire, Werner LeanShanker M, MD  Neomycin-Bacitracin-Polymyxin (TRIPLE ANTIBIOTIC) [phone] OINT Apply 1 application topically daily as needed (skin tears/abrasions).    Yes [provider]    omeprazole (PRILOSEC) 20 MG capsule Take 20 mg by mouth daily.   Yes [provider]  PARoxetine (PAXIL) 10 MG tablet Take 1 tablet (10 mg total) by mouth daily. 11/14/13  Yes Ghimire, Werner LeanShanker M, MD  UNABLE TO FIND Take 1 each by mouth 3 (three) times daily. Mighty Shakes   Yes [provider]  FLUZONE HIGH-DOSE 0.5 ML injection Inject 0.5 mLs into the skin once. 07/14/17 07/14/17   [provider]  furosemide (LASIX) 20 MG tablet Take 1 tablet (20 mg total) by mouth every other day. Patient not taking: Reported on 09/30/2017 03/13/16   Rhetta MuraSamtani, Jai-Gurmukh, MD  nitrofurantoin, macrocrystal-monohydrate, (MACROBID) 100 MG capsule Take 1 capsule (100 mg total) by mouth every 12 (twelve) hours. Patient not taking: Reported on 05/05/2016 03/13/16   Rhetta MuraSamtani, Jai-Gurmukh, MD    Family History Family History  Problem Relation Age of Onset  . Dementia Sister   . Diabetes Mellitus II Neg Hx   . Hypertension Neg Hx     Social History Social History   Tobacco Use  . Smoking status: Never Smoker  . Smokeless tobacco: Never Used  Substance Use Topics  . Alcohol use: No  . Drug use: No     Allergies   Patient has no known allergies.   Review of Systems Review of Systems  Unable to perform ROS: Dementia  Constitutional: Positive for fever.  Respiratory: Positive for cough and shortness of breath.   Gastrointestinal: Negative for nausea and vomiting.     Physical Exam Updated Vital Signs BP (!) 132/115 (BP Location: Left Arm) Comment: nurse notified  Pulse 72   Temp 97.6 F (36.4 C) (Axillary)   Resp (!) 22   SpO2 97%   Physical Exam  Constitutional: She appears well-developed and well-nourished. She appears ill. No distress.  HENT:  Head: Normocephalic and atraumatic.  Eyes: Conjunctivae and EOM are normal.  Neck: Normal range of motion.  Cardiovascular: Normal rate, regular rhythm, normal heart sounds and intact distal pulses. Exam reveals no gallop  and no friction rub.  No murmur heard. Pulmonary/Chest: Effort normal. No respiratory distress. She has no wheezes. She has rales (right sided).  Abdominal: Soft. She exhibits no distension. There is no tenderness. There is no guarding.  Musculoskeletal: She exhibits no edema or tenderness.  Neurological: She is alert.  Skin: Skin is warm and dry. No rash noted. She is not diaphoretic. No erythema.  Nursing note and vitals reviewed.    ED Treatments / Results  Labs (all labs ordered are listed, but only abnormal results are displayed) Labs Reviewed  COMPREHENSIVE METABOLIC PANEL - Abnormal; Notable for the following components:      Result Value   Glucose, Bld 112 (*)    Calcium 8.7 (*)    Albumin 3.1 (*)    ALT 9 (*)    GFR calc non Af Amer 54 (*)    All other components within normal limits  CBC  WITH DIFFERENTIAL/PLATELET - Abnormal; Notable for the following components:   Monocytes Absolute 1.2 (*)    All other components within normal limits  URINALYSIS, COMPLETE (UACMP) WITH MICROSCOPIC - Abnormal; Notable for the following components:   Color, Urine AMBER (*)    APPearance CLOUDY (*)    Specific Gravity, Urine 1.032 (*)    Bilirubin Urine SMALL (*)    Protein, ur 30 (*)    Leukocytes, UA LARGE (*)    Bacteria, UA MANY (*)    Squamous Epithelial / LPF 6-30 (*)    All other components within normal limits  CULTURE, BLOOD (ROUTINE X 2)  CULTURE, BLOOD (ROUTINE X 2)  URINE CULTURE  CULTURE, EXPECTORATED SPUTUM-ASSESSMENT  GRAM STAIN  RESPIRATORY PANEL BY PCR  MRSA PCR SCREENING  INFLUENZA PANEL BY PCR (TYPE A & B)  PROTIME-INR  STREP PNEUMONIAE URINARY ANTIGEN  LEGIONELLA PNEUMOPHILA SEROGP 1 UR AG  BASIC METABOLIC PANEL  CBC  I-STAT CG4 LACTIC ACID, ED    EKG  EKG Interpretation None       Radiology Dg Chest 2 View  Result Date: 09/30/2017 CLINICAL DATA:  Suspected sepsis. EXAM: CHEST  2 VIEW COMPARISON:  05/05/2016 FINDINGS: The cardiomediastinal  silhouette is unchanged allowing for rightward patient rotation. Chronic interstitial coarsening is unchanged. There are new patchy airspace opacities in the lateral right mid lung and right lung base. No sizable pleural effusion or pneumothorax is identified. No acute osseous abnormality is seen. IMPRESSION: 1. Patchy right lung opacities concerning for pneumonia. 2. Chronic interstitial lung disease. Electronically Signed   By: Sebastian Ache M.D.   On: 09/30/2017 16:04    Procedures .Critical Care Performed by: Alvira Monday, MD Authorized by: Alvira Monday, MD   Comments:     CRITICAL CARE:sepsis, 2 abx, multiple fluid boluses Performed by: Lynnea Ferrier   Total critical care time: 30 minutes  Critical care time was exclusive of separately billable procedures and treating other patients.  Critical care was necessary to treat or prevent imminent or life-threatening deterioration.  Critical care was time spent personally by me on the following activities: development of treatment plan with patient and/or surrogate as well as nursing, discussions with consultants, evaluation of patient's response to treatment, examination of patient, obtaining history from patient or surrogate, ordering and performing treatments and interventions, ordering and review of laboratory studies, ordering and review of radiographic studies, pulse oximetry and re-evaluation of patient's condition.    (including critical care time)  Medications Ordered in ED Medications  ALPRAZolam (XANAX) tablet 0.5 mg (not administered)  alum & mag hydroxide-simeth (MAALOX/MYLANTA) 200-200-20 MG/5ML suspension 30 mL (not administered)  aspirin tablet 325 mg (not administered)  benzonatate (TESSALON) capsule 100 mg (not administered)  guaiFENesin (MUCINEX) 12 hr tablet 600 mg (not administered)  guaifenesin (ROBITUSSIN) 100 MG/5ML syrup 200 mg (not administered)  metoprolol tartrate (LOPRESSOR) tablet 50 mg (not  administered)  pantoprazole (PROTONIX) EC tablet 40 mg (not administered)  PARoxetine (PAXIL) tablet 10 mg (not administered)  enoxaparin (LOVENOX) injection 40 mg (not administered)  0.9 %  sodium chloride infusion (not administered)  ceFEPIme (MAXIPIME) 1 g in dextrose 5 % 50 mL IVPB (not administered)  levalbuterol (XOPENEX) nebulizer solution 1.25 mg (not administered)  levalbuterol (XOPENEX) nebulizer solution 0.63 mg (0.63 mg Nebulization Given 09/30/17 2119)  ipratropium (ATROVENT) nebulizer solution 0.5 mg (not administered)  ipratropium (ATROVENT) nebulizer solution 0.5 mg (0.5 mg Nebulization Given 09/30/17 2119)  vancomycin (VANCOCIN) IVPB 750 mg/150 ml premix (not administered)  sodium chloride 0.9 % bolus 1,000 mL (0 mLs Intravenous Stopped 09/30/17 1559)    And  sodium chloride 0.9 % bolus 1,000 mL (0 mLs Intravenous Stopped 09/30/17 1559)    And  sodium chloride 0.9 % bolus 250 mL (250 mLs Intravenous New Bag/Given 09/30/17 1805)  ceFEPIme (MAXIPIME) 2 g in dextrose 5 % 50 mL IVPB (0 g Intravenous Stopped 09/30/17 1706)  vancomycin (VANCOCIN) IVPB 1000 mg/200 mL premix (0 mg Intravenous Stopped 09/30/17 1706)     Initial Impression / Assessment and Plan / ED Course  I have reviewed the triage vital signs and the nursing notes.  Pertinent labs & imaging results that were available during my care of the patient were reviewed by me and considered in my medical decision making (see chart for details).     83yo female with history of dementia, atrial fibrillation, COPD presents with concern for cough and fever.  Patient hypoxic on room air and placed on 4L pNC and febrile.  Chest XR shows right sided pneumonia. Given vanc/cefepime and 30cc/kg NS for concern for sepsis secondary to pneumonia.  Admitted for further care.  Final Clinical Impressions(s) / ED Diagnoses   Final diagnoses:  HCAP (healthcare-associated pneumonia)  Sepsis, due to unspecified organism [facility](HCC)    ED  Discharge Orders    None       Alvira MondaySchlossman, Justyn Boyson, MD 09/30/17 2211

## 2017-09-30 NOTE — H&P (Addendum)
History and Physical    Brooke Phelps ZOX:[id number] DOB: 31-Jul-1928 DOA: 09/30/2017  Referring MD/NP/PA: Dr. Dalene SeltzerSchlossman   PCP: [name], [name], [name]   Patient coming from: SNF  Chief Complaint: dyspnea and cough   HPI: Brooke Phelps is a 83 y.o. female with known HTN, dementia, [facility] with main concern of ongoing fevers, exertional dyspnea, productive cough of clear sputum. Pt is in memory care unit and due to advanced dementia is not able to provide detailed information. Please note most of the information obtained from available SNF records and ED doctor. Per report Tmax was 102.3F about two days prior to this admission, no reports of chest pain, abd concerns.   ED Course: In Ed, pt is hemodynamically stable, VS notable for T 100.8 F, oxygen saturation in 80's, CXR with right lung opacity. Pt was placed on oxygen via [place] 4 L, started on vanc and cefepime and TRH asked to admit to telemetry unit.   Review of Systems:  Unable to obtain due to dementia. .   Past Medical History:  Diagnosis Date  . Asthma   . Atrial fibrillation (HCC)   . COPD (chronic obstructive pulmonary disease) (HCC)   . Dementia     Past Surgical History:  Procedure Laterality Date  . ORIF ANKLE FRACTURE Left 11/11/2013   Procedure: OPEN REDUCTION INTERNAL FIXATION (ORIF) ANKLE FRACTURE;  Surgeon: Nadara MustardMarcus V Duda, MD;  Location: [facility] OR;  Service: Orthopedics;  Laterality: Left;    Social history:  reports that  has never smoked. she has never used smokeless tobacco. She reports that she does not drink alcohol or use drugs.  No Known Allergies   Ambulation: wheelchair bound  Family History  Problem Relation Age of Onset  . Dementia Sister   . Diabetes Mellitus II Neg Hx   . Hypertension Neg Hx    Prior to Admission medications   Medication Sig Start Date End Date Taking? Authorizing Provider  ALPRAZolam Prudy Feeler(XANAX) 0.5 MG tablet Take 1 tablet (0.5 mg total)  by mouth every 8 (eight) hours as needed for anxiety. Patient taking differently: Take 0.5 mg by mouth 2 (two) times daily.  11/14/13  Yes Ghimire, Werner LeanShanker M, MD  aspirin 325 MG tablet Take 1 tablet (325 mg total) by mouth daily. 11/14/13  Yes Ghimire, Werner LeanShanker M, MD  benzonatate (TESSALON) 100 MG capsule Take 100 mg by mouth daily.   Yes [provider]  docusate sodium 100 MG CAPS Take 100 mg by mouth 2 (two) times daily. Patient taking differently: Take 100 mg by mouth daily.  11/14/13  Yes Ghimire, Werner LeanShanker M, MD  guaiFENesin (MUCINEX) 600 MG 12 hr tablet Take 600 mg by mouth 2 (two) times daily.   Yes [provider]  guaifenesin (ROBITUSSIN) 100 MG/5ML syrup Take 200 mg by mouth 4 (four) times daily as needed for cough.   Yes [provider]  levalbuterol (XOPENEX) 0.63 MG/3ML nebulizer solution Take 3 mLs (0.63 mg total) by nebulization 3 (three) times daily. Patient taking differently: Take 0.63 mg by nebulization every 4 (four) hours as needed for wheezing.  11/14/13  Yes Ghimire, Werner LeanShanker M, MD  loperamide (IMODIUM) 2 MG capsule Take 2 mg by mouth as needed for diarrhea or loose stools.   Yes [provider]  metoprolol (LOPRESSOR) 50 MG tablet Take 1 tablet (50 mg total) by mouth 2 (two) times daily. 11/14/13  Yes Ghimire, Werner LeanShanker M, MD  Neomycin-Bacitracin-Polymyxin (TRIPLE ANTIBIOTIC)  [phone] OINT Apply 1 application topically daily as needed (skin tears/abrasions).    Yes [provider]  omeprazole (PRILOSEC) 20 MG capsule Take 20 mg by mouth daily.   Yes [provider]  PARoxetine (PAXIL) 10 MG tablet Take 1 tablet (10 mg total) by mouth daily. 11/14/13  Yes Ghimire, Werner LeanShanker M, MD  FLUZONE HIGH-DOSE 0.5 ML injection Inject 0.5 mLs into the skin once. 07/14/17 07/14/17   [provider]  furosemide (LASIX) 20 MG tablet Take 1 tablet (20 mg total) by mouth every other day. Patient not taking: Reported on 09/30/2017 03/13/16    Rhetta MuraSamtani, Jai-Gurmukh, MD   Physical Exam: Vitals:   09/30/17 1353 09/30/17 1506 09/30/17 1530 09/30/17 1615  BP:  (!) 147/64 (!) 153/67 (!) 156/65  Pulse:  60 63 71  Resp:  17 (!) 28 18  Temp:      TempSrc:      SpO2: 94% 92% 99% 92%    Constitutional: NAD, calm, comfortable Vitals:   09/30/17 1353 09/30/17 1506 09/30/17 1530 09/30/17 1615  BP:  (!) 147/64 (!) 153/67 (!) 156/65  Pulse:  60 63 71  Resp:  17 (!) 28 18  Temp:      TempSrc:      SpO2: 94% 92% 99% 92%   Eyes: PERRL, lids and conjunctivae normal ENMT: Mucous membranes are moist. Posterior pharynx clear of any exudate or lesions.Normal dentition.  Neck: normal, supple, no masses, no thyromegaly Respiratory: clear to auscultation bilaterally, no wheezing, no crackles. Normal respiratory effort. No accessory muscle use.  Cardiovascular: Regular rate and rhythm, no murmurs / rubs / gallops. No extremity edema. 2+ pedal pulses. No carotid bruits.  Abdomen: no tenderness, no masses palpated. No hepatosplenomegaly. Bowel sounds positive.  Musculoskeletal: no clubbing / cyanosis. No joint deformity upper and lower extremities. Good ROM, no contractures. Normal muscle tone.  Skin: no rashes, lesions, ulcers. No induration Neurologic: CN 2-12 grossly intact. Sensation intact, DTR normal. Strength 5/5 in all 4.  Psychiatric: Normal judgment and insight. Alert and oriented x 3. Normal mood.   Labs on Admission: I have personally reviewed following labs and imaging studies  CBC: Recent Labs  Lab 09/30/17 1400  WBC 8.4  NEUTROABS 5.3  HGB 13.3  HCT 41.3  MCV 94.7  PLT 255   Basic Metabolic Panel: Recent Labs  Lab 09/30/17 1400  NA 138  K 3.5  CL 102  CO2 30  GLUCOSE 112*  BUN 18  CREATININE 0.92  CALCIUM 8.7*   Liver Function Tests: Recent Labs  Lab 09/30/17 1400  AST 16  ALT 9*  ALKPHOS 57  BILITOT 1.0  PROT 7.3  ALBUMIN 3.1*   Coagulation Profile: Recent Labs  Lab 09/30/17 1500  INR 1.00    Urine analysis:    Component Value Date/Time   COLORURINE AMBER (A) 03/11/2016 1120   APPEARANCEUR TURBID (A) 03/11/2016 1120   LABSPEC 1.019 03/11/2016 1120   PHURINE 5.5 03/11/2016 1120   GLUCOSEU NEGATIVE 03/11/2016 1120   HGBUR MODERATE (A) 03/11/2016 1120   BILIRUBINUR SMALL (A) 03/11/2016 1120   KETONESUR 15 (A) 03/11/2016 1120   PROTEINUR 100 (A) 03/11/2016 1120   UROBILINOGEN 1.0 11/10/2013 2130   NITRITE POSITIVE (A) 03/11/2016 1120   LEUKOCYTESUR LARGE (A) 03/11/2016 1120   Radiological Exams on Admission: Dg Chest 2 View  Result Date: 09/30/2017 CLINICAL DATA:  Suspected sepsis. EXAM: CHEST  2 VIEW COMPARISON:  05/05/2016 FINDINGS: The cardiomediastinal silhouette is unchanged allowing for rightward patient  rotation. Chronic interstitial coarsening is unchanged. There are new patchy airspace opacities in the lateral right mid lung and right lung base. No sizable pleural effusion or pneumothorax is identified. No acute osseous abnormality is seen. IMPRESSION: 1. Patchy right lung opacities concerning for pneumonia. 2. Chronic interstitial lung disease. Electronically Signed   By: Sebastian Ache M.D.   On: 09/30/2017 16:04    EKG: pending   Assessment/Plan  Active Problems:   HCAP (healthcare-associated pneumonia) - right lung opacity - pneumonia order set in place - agree with vanc and cefepime for now and narrow down as clinically indicated - follow up on resp panel, sputum culture, strep penumo and urine legionella - antitussives as needed - BD's as needed     Acute hypoxic respiratory failure with hypoxia - from PNA - ABX as noted above     Dementia - continue home medical regimen     Possible UTI - urine cultures pending - current ABX should be adequate    DVT prophylaxis: Lovenox SQ Code Status: DNR/DNI Family Communication: no family at bedside  Disposition Plan: will go to SNF Consults called: None Admission status: Inpatient    Manson Passey  MD [facility] Pager [phone]  If 7PM-7AM, please contact night-coverage [email or web address] Password [email or web address]  09/30/2017, 5:10 PM

## 2017-10-01 LAB — RESPIRATORY PANEL BY PCR
Adenovirus: NOT DETECTED
BORDETELLA PERTUSSIS-RVPCR: NOT DETECTED
CORONAVIRUS 229E-RVPPCR: NOT DETECTED
CORONAVIRUS HKU1-RVPPCR: NOT DETECTED
CORONAVIRUS OC43-RVPPCR: NOT DETECTED
Chlamydophila pneumoniae: NOT DETECTED
Coronavirus NL63: NOT DETECTED
Influenza A: NOT DETECTED
Influenza B: NOT DETECTED
METAPNEUMOVIRUS-RVPPCR: NOT DETECTED
Mycoplasma pneumoniae: NOT DETECTED
PARAINFLUENZA VIRUS 1-RVPPCR: NOT DETECTED
PARAINFLUENZA VIRUS 2-RVPPCR: NOT DETECTED
PARAINFLUENZA VIRUS 3-RVPPCR: NOT DETECTED
Parainfluenza Virus 4: NOT DETECTED
RHINOVIRUS / ENTEROVIRUS - RVPPCR: DETECTED — AB
Respiratory Syncytial Virus: NOT DETECTED

## 2017-10-01 LAB — BASIC METABOLIC PANEL
Anion gap: 5 (ref 5–15)
BUN: 11 mg/dL (ref 6–20)
CHLORIDE: 109 mmol/L (ref 101–111)
CO2: 26 mmol/L (ref 22–32)
CREATININE: 0.68 mg/dL (ref 0.44–1.00)
Calcium: 7.9 mg/dL — ABNORMAL LOW (ref 8.9–10.3)
GFR calc Af Amer: >60 mL/min (ref >60)
GFR calc non Af Amer: >60 mL/min (ref >60)
GLUCOSE: 103 mg/dL — AB (ref 65–99)
POTASSIUM: 3.5 mmol/L (ref 3.5–5.1)
Sodium: 140 mmol/L (ref 135–145)

## 2017-10-01 LAB — CBC
HEMATOCRIT: 37.1 % (ref 36.0–46.0)
Hemoglobin: 11.6 g/dL — ABNORMAL LOW (ref 12.0–15.0)
MCH: 30.2 pg (ref 26.0–34.0)
MCHC: 31.3 g/dL (ref 30.0–36.0)
MCV: 96.6 fL (ref 78.0–100.0)
PLATELETS: 236 10*3/uL (ref 150–400)
RBC: 3.84 MIL/uL — ABNORMAL LOW (ref 3.87–5.11)
RDW: 14.1 % (ref 11.5–15.5)
WBC: 8.1 10*3/uL (ref 4.0–10.5)

## 2017-10-01 LAB — STREP PNEUMONIAE URINARY ANTIGEN: STREP PNEUMO URINARY ANTIGEN: NEGATIVE

## 2017-10-01 MED ORDER — LEVALBUTEROL HCL 0.63 MG/3ML IN NEBU: 0.6300 mg | INHALATION_SOLUTION | Freq: Three times a day (TID) | RESPIRATORY_TRACT | Status: DC

## 2017-10-01 MED ORDER — GUAIFENESIN 100 MG/5ML PO SOLN
10.0000 mL | Freq: Four times a day (QID) | ORAL | Status: DC | PRN
  Administered 2017-10-01 – 2017-10-03 (×4): 200 mg via ORAL
  Filled 2017-10-01 (×2): qty 10
  Filled 2017-10-01: qty 20
  Filled 2017-10-01: qty 10

## 2017-10-01 MED ORDER — IPRATROPIUM BROMIDE 0.02 % IN SOLN: 0.5000 mg | Freq: Three times a day (TID) | RESPIRATORY_TRACT | Status: DC

## 2017-10-01 MED ORDER — IPRATROPIUM BROMIDE 0.02 % IN SOLN
0.5000 mg | RESPIRATORY_TRACT | Status: DC | PRN
  Administered 2017-10-01 – 2017-10-04 (×2): 0.5 mg via RESPIRATORY_TRACT
  Filled 2017-10-01 (×2): qty 2.5

## 2017-10-01 MED ORDER — LEVALBUTEROL HCL 0.63 MG/3ML IN NEBU
0.6300 mg | INHALATION_SOLUTION | Freq: Four times a day (QID) | RESPIRATORY_TRACT | Status: DC
  Administered 2017-10-02 (×4): 0.63 mg via RESPIRATORY_TRACT
  Filled 2017-10-01 (×4): qty 3

## 2017-10-01 MED ORDER — IPRATROPIUM BROMIDE 0.02 % IN SOLN
0.5000 mg | Freq: Four times a day (QID) | RESPIRATORY_TRACT | Status: DC
  Administered 2017-10-02 (×4): 0.5 mg via RESPIRATORY_TRACT
  Filled 2017-10-01 (×4): qty 2.5

## 2017-10-01 NOTE — Progress Notes (Signed)
Patient ID: Brooke Phelps, female   DOB: [birth year], 83 y.o.   MRN: [id number]  PROGRESS NOTE    Brooke Ferrarivelyn R Fass  MVH:[id number] DOB: [birth year] DOA: 09/30/2017  PCP: [name], [name], [name]   Brief Narrative:  83 y.o. female with known HTN, dementia, [facility] EMS [facility] with main concern of ongoing fevers, exertional dyspnea, productive cough of clear sputum. Pt was febrile on admission. CXR showed right lung opacity. Pt was hypoxic which improved with oxygen support. Pt started on empiric abx while awaiting culture results.    Assessment & Plan:   Active Problems:   HCAP (healthcare-associated pneumonia) / Rhinovirus (+) - Pt has right lung opacity on CXR - Continue vanco and cefepime - Sputum cultures pending - Blood cx showed no growth  - Resp virus panel positive for rhinovirus     Acute hypoxic respiratory failure with hypoxia - Secondary to pneumonia - Continue current nebulizer treatments     Dementia without behavioral disturbance  - Stable     Functional quadriplegia - Wheelchair and bed bound     Possible UTI - Continue current abx - Urine cx pending    DVT prophylaxis: Lovenox subQ Code Status: DNR/DNI Family Communication: updated family at bedside 11/29  Disposition Plan: not yet stable for discharge    Consultants:   None   Procedures:   None   Antimicrobials:   Vanco and cefepime 11/29 -->   Subjective: No overnight events.   Objective: Vitals:   10/01/17 0804 10/01/17 1029 10/01/17 1118 10/01/17 1400  BP:    (!) 154/63  Pulse:    65  Resp:      Temp:    97.7 F (36.5 C)  TempSrc:    Axillary  SpO2: 92%   98%  Weight:  76.9 kg (169 lb 8.5 oz)    Height:   5\' 4"  (1.626 m)     Intake/Output Summary (Last 24 hours) at 10/01/2017 1610 Last data filed at 10/01/2017 1500 Gross per 24 hour  Intake 1715 ml  Output 350 ml  Net 1365 ml   Filed Weights   10/01/17 1029  Weight: 76.9 kg (169 lb  8.5 oz)    Examination:  General exam: Appears in no distress  Respiratory system: coarse sounds, rhonchorous  Cardiovascular system: S1 & S2 heard, Rate controlled  Gastrointestinal system: Abdomen is nondistended, soft and nontender. No organomegaly or masses felt. Normal bowel sounds heard. Central nervous system: No focal neurological deficits. Extremities: Palpable pulses, non tender  Skin: No rashes, lesions or ulcers Psychiatry: Not restless or agitated   Data Reviewed: I have personally reviewed following labs and imaging studies  CBC: Recent Labs  Lab 09/30/17 1400 10/01/17 0556  WBC 8.4 8.1  NEUTROABS 5.3  --   HGB 13.3 11.6*  HCT 41.3 37.1  MCV 94.7 96.6  PLT 255 236   Basic Metabolic Panel: Recent Labs  Lab 09/30/17 1400 10/01/17 0556  NA 138 140  K 3.5 3.5  CL 102 109  CO2 30 26  GLUCOSE 112* 103*  BUN 18 11  CREATININE 0.92 0.68  CALCIUM 8.7* 7.9*   GFR: Estimated Creatinine Clearance: 49.7 mL/min (by C-G formula based on SCr of 0.68 mg/dL). Liver Function Tests: Recent Labs  Lab 09/30/17 1400  AST 16  ALT 9*  ALKPHOS 57  BILITOT 1.0  PROT 7.3  ALBUMIN 3.1*   No results for input(s): LIPASE, AMYLASE in the last 168 hours. No results for  input(s): AMMONIA in the last 168 hours. Coagulation Profile: Recent Labs  Lab 09/30/17 1500  INR 1.00   Cardiac Enzymes: No results for input(s): CKTOTAL, CKMB, CKMBINDEX, TROPONINI in the last 168 hours. BNP (last 3 results) No results for input(s): PROBNP in the last 8760 hours. HbA1C: No results for input(s): HGBA1C in the last 72 hours. CBG: No results for input(s): GLUCAP in the last 168 hours. Lipid Profile: No results for input(s): CHOL, HDL, LDLCALC, TRIG, CHOLHDL, LDLDIRECT in the last 72 hours. Thyroid Function Tests: No results for input(s): TSH, T4TOTAL, FREET4, T3FREE, THYROIDAB in the last 72 hours. Anemia Panel: No results for input(s): VITAMINB12, FOLATE, FERRITIN, TIBC, IRON,  RETICCTPCT in the last 72 hours. Urine analysis:    Component Value Date/Time   COLORURINE AMBER (A) 09/30/2017 1730   APPEARANCEUR CLOUDY (A) 09/30/2017 1730   LABSPEC 1.032 (H) 09/30/2017 1730   PHURINE 5.0 09/30/2017 1730   GLUCOSEU NEGATIVE 09/30/2017 1730   HGBUR NEGATIVE 09/30/2017 1730   BILIRUBINUR SMALL (A) 09/30/2017 1730   KETONESUR NEGATIVE 09/30/2017 1730   PROTEINUR 30 (A) 09/30/2017 1730   UROBILINOGEN 1.0 11/10/2013 2130   NITRITE NEGATIVE 09/30/2017 1730   LEUKOCYTESUR LARGE (A) 09/30/2017 1730   Sepsis Labs: @LABRCNTIP (procalcitonin:4,lacticidven:4)    Culture, blood (Routine x 2)     Status: None (Preliminary result)   Collection Time: 09/30/17  2:00 PM  Result Value Ref Range Status   Specimen Description BLOOD RIGHT FOREARM  Final   Special Requests   Final    BOTTLES DRAWN AEROBIC AND ANAEROBIC Blood Culture results may not be optimal due to an inadequate volume of blood received in culture bottles   Culture   Final    NO GROWTH < 24 HOURS    Report Status PENDING  Incomplete  Culture, blood (Routine x 2)     Status: None (Preliminary result)   Collection Time: 09/30/17  2:05 PM  Result Value Ref Range Status   Specimen Description BLOOD LEFT ANTECUBITAL  Final   Special Requests   Final    BOTTLES DRAWN AEROBIC AND ANAEROBIC Blood Culture adequate volume   Culture   Final    NO GROWTH < 24 HOURS    Report Status PENDING  Incomplete  Culture, sputum-assessment     Status: None   Collection Time: 09/30/17  7:05 PM  Result Value Ref Range Status   Specimen Description SPUTUM  Final   Special Requests NONE  Final   Sputum evaluation THIS SPECIMEN IS ACCEPTABLE FOR SPUTUM CULTURE  Final   Report Status 09/30/2017 FINAL  Final  Culture, respiratory (NON-Expectorated)     Status: None (Preliminary result)   Collection Time: 09/30/17  7:05 PM  Result Value Ref Range Status   Specimen Description SPUTUM  Final   Special Requests NONE Reflexed from  [id number]  Final   Gram Stain   Final    ABUNDANT WBC PRESENT, PREDOMINANTLY PMN FEW GRAM POSITIVE COCCI IN PAIRS    Culture PENDING  Incomplete   Report Status PENDING  Incomplete  Respiratory Panel by PCR     Status: Abnormal   Collection Time: 09/30/17  7:21 PM  Result Value Ref Range Status   Adenovirus NOT DETECTED NOT DETECTED Final   Coronavirus 229E NOT DETECTED NOT DETECTED Final   Coronavirus HKU1 NOT DETECTED NOT DETECTED Final   Coronavirus NL63 NOT DETECTED NOT DETECTED Final   Coronavirus OC43 NOT DETECTED NOT DETECTED Final   Metapneumovirus NOT DETECTED NOT DETECTED Final  Rhinovirus / Enterovirus DETECTED (A) NOT DETECTED Final   Influenza A NOT DETECTED NOT DETECTED Final   Influenza B NOT DETECTED NOT DETECTED Final   Parainfluenza Virus 1 NOT DETECTED NOT DETECTED Final   Parainfluenza Virus 2 NOT DETECTED NOT DETECTED Final   Parainfluenza Virus 3 NOT DETECTED NOT DETECTED Final   Parainfluenza Virus 4 NOT DETECTED NOT DETECTED Final   Respiratory Syncytial Virus NOT DETECTED NOT DETECTED Final   Bordetella pertussis NOT DETECTED NOT DETECTED Final   Chlamydophila pneumoniae NOT DETECTED NOT DETECTED Final   Mycoplasma pneumoniae NOT DETECTED NOT DETECTED Final    Comment: Performed at [facility], [address] [place], [place] [address]  MRSA PCR Screening     Status: None   Collection Time: 09/30/17  7:36 PM  Result Value Ref Range Status   MRSA by PCR NEGATIVE NEGATIVE Final      Radiology Studies: Dg Chest 2 View Result Date: 09/30/2017 1. Patchy right lung opacities concerning for pneumonia. 2. Chronic interstitial lung disease.    Scheduled Meds: . aspirin  325 mg Oral Daily  . benzonatate  100 mg Oral Daily  . enoxaparin (LOVENOX) injection  40 mg Subcutaneous Q24H  . guaiFENesin  600 mg Oral BID  . ipratropium  0.5 mg Nebulization Q6H  . levalbuterol  0.63 mg Nebulization Q6H  . metoprolol tartrate  50 mg Oral BID  . pantoprazole   40 mg Oral Daily  . PARoxetine  10 mg Oral Daily   Continuous Infusions: . sodium chloride 75 mL/hr at 10/01/17 0900  . ceFEPime (MAXIPIME) IV Stopped (10/01/17 [id number])  . vancomycin Stopped (10/01/17 1431)     LOS: 1 day    Time spent: 25 minutes Greater than 50% of the time spent on counseling and coordinating the care.   Manson PasseyAlma Ticara Waner, MD [facility] Pager [phone]  If 7PM-7AM, please contact night-coverage [email or web address] Password [email or web address] 10/01/2017, 4:10 PM

## 2017-10-01 NOTE — Care Management Note (Signed)
Case Management Note  Patient Details  Name: Brooke Phelps MRN: [id number] Date of Birth: 1928-03-01  Subjective/Objective:                  pna  Action/Plan: Date:  October 01, 2017 Chart reviewed for concurrent status and case management needs.  Will continue to follow patient progress.  Discharge Planning: following for needs  Expected discharge date: October 04, 2017  Marcelle SmilingRhonda Janaysia Mcleroy, BSN, [place]3, [place]   [phone]   Expected Discharge Date:  10/04/17               Expected Discharge Plan:  Assisted Living / Rest Home  In-House Referral:  Clinical Social Work  Discharge planning Services  CM Consult  Post Acute Care Choice:    Choice offered to:     DME Arranged:    DME Agency:     HH Arranged:    HH Agency:     Status of Service:  In process, will continue to follow  If discussed at Long Length of Stay Meetings, dates discussed:    Additional Comments:  Brooke Phelps, Brooke Dejonge Lynn, RN 10/01/2017, 9:21 AM

## 2017-10-02 LAB — URINE CULTURE

## 2017-10-02 LAB — CBC
HEMATOCRIT: 38.6 % (ref 36.0–46.0)
Hemoglobin: 12.1 g/dL (ref 12.0–15.0)
MCH: 29.6 pg (ref 26.0–34.0)
MCHC: 31.3 g/dL (ref 30.0–36.0)
MCV: 94.4 fL (ref 78.0–100.0)
Platelets: 246 10*3/uL (ref 150–400)
RBC: 4.09 MIL/uL (ref 3.87–5.11)
RDW: 13.8 % (ref 11.5–15.5)
WBC: 10.3 10*3/uL (ref 4.0–10.5)

## 2017-10-02 LAB — BASIC METABOLIC PANEL
ANION GAP: 8 (ref 5–15)
BUN: 7 mg/dL (ref 6–20)
CALCIUM: 8.1 mg/dL — AB (ref 8.9–10.3)
CO2: 25 mmol/L (ref 22–32)
Chloride: 103 mmol/L (ref 101–111)
Creatinine, Ser: 0.68 mg/dL (ref 0.44–1.00)
GFR calc Af Amer: >60 mL/min (ref >60)
GFR calc non Af Amer: >60 mL/min (ref >60)
GLUCOSE: 97 mg/dL (ref 65–99)
Potassium: 3.4 mmol/L — ABNORMAL LOW (ref 3.5–5.1)
Sodium: 136 mmol/L (ref 135–145)

## 2017-10-02 MED ORDER — SODIUM CHLORIDE 0.9 % IV BOLUS (SEPSIS)
500.0000 mL | Freq: Once | INTRAVENOUS | Status: AC
  Administered 2017-10-02: 500 mL via INTRAVENOUS

## 2017-10-02 MED ORDER — METOPROLOL TARTRATE 5 MG/5ML IV SOLN
5.0000 mg | Freq: Once | INTRAVENOUS | Status: AC
  Administered 2017-10-02: 5 mg via INTRAVENOUS
  Filled 2017-10-02: qty 5

## 2017-10-02 MED ORDER — METOPROLOL TARTRATE 5 MG/5ML IV SOLN
INTRAVENOUS | Status: AC
  Filled 2017-10-02: qty 5

## 2017-10-02 MED ORDER — METOPROLOL TARTRATE 5 MG/5ML IV SOLN
2.5000 mg | Freq: Four times a day (QID) | INTRAVENOUS | Status: DC | PRN
  Administered 2017-10-02 – 2017-10-05 (×4): 2.5 mg via INTRAVENOUS
  Filled 2017-10-02 (×6): qty 5

## 2017-10-02 MED ORDER — METOPROLOL TARTRATE 5 MG/5ML IV SOLN
2.5000 mg | Freq: Once | INTRAVENOUS | Status: AC
  Administered 2017-10-02: 2.5 mg via INTRAVENOUS
  Filled 2017-10-02: qty 5

## 2017-10-02 MED ORDER — HYDRALAZINE HCL 20 MG/ML IJ SOLN
10.0000 mg | Freq: Once | INTRAMUSCULAR | Status: AC
  Administered 2017-10-02: 10 mg via INTRAVENOUS
  Filled 2017-10-02: qty 1

## 2017-10-02 MED ORDER — HYDRALAZINE HCL 20 MG/ML IJ SOLN
10.0000 mg | INTRAMUSCULAR | Status: DC | PRN
  Administered 2017-10-03 – 2017-10-08 (×12): 10 mg via INTRAVENOUS
  Filled 2017-10-02 (×12): qty 1

## 2017-10-02 NOTE — Progress Notes (Signed)
Have alerted MD to patient's HR in the 150s. Received order for one time dose of IV Lopressor. Order noted.

## 2017-10-02 NOTE — Progress Notes (Addendum)
Patient ID: Brooke Phelps, female   DOB: [birth year], 83 y.o.   MRN: [id number]  PROGRESS NOTE    Brooke Phelps  XLK:[id number] DOB: [birth year] DOA: 09/30/2017  PCP: [name], [name], [name]   Brief Narrative:  83 y.o. female with known HTN, dementia, presented via EMS [facility] with main concern of ongoing fevers, exertional dyspnea, productive cough of clear sputum. Pt was febrile on admission. CXR showed right lung opacity. Pt was hypoxic which improved with oxygen support. Pt started on empiric abx while awaiting culture results.    Assessment & Plan:   Active Problems:   HCAP (healthcare-associated pneumonia) / Rhinovirus (+) - Pt has right lung opacity on CXR - Resp virus panel positive for rhinovirus - Blood cx negative so far - Strep pneumonia is negative - Legionella is pending - Respiratory culture pending - Continue vanco and cefepime     Essential hypertension - Continue metoprolol BID and hydralazine PRN    Acute hypoxic respiratory failure with hypoxia - Secondary to pneumonia - Continue current nebulizer treatment    Dementia without behavioral disturbance  - Stable mental status, no changes since the admission    Functional quadriplegia - Wheelchair and bed bound  - Stable    Possible UTI -  Continue current antibiotics while awaiting urine culture results    DVT prophylaxis: SCDs  Code Status: DNR/DNI Family Communication:  no family at the bedside Disposition Plan:  obtain PT evaluation    Consultants:   Physical therapy  Procedures:   None  Antimicrobials:   Vanco and cefepime 11/29 -->   Subjective: No overnight events.   Objective: Vitals:   10/02/17 0105 10/02/17 0500 10/02/17 0900 10/02/17 0903  BP:  (!) 172/72    Pulse:  68    Resp:  18    Temp:  98.7 F (37.1 C)    TempSrc:  Oral    SpO2: 97% 98% 93% 93%  Weight:      Height:        Intake/Output Summary (Last 24 hours) at 10/02/2017  1006 Last data filed at 10/02/2017 0820 Gross per 24 hour  Intake 2595 ml  Output 950 ml  Net 1645 ml   Filed Weights   10/01/17 1029  Weight: 76.9 kg (169 lb 8.5 oz)    Physical Exam  Constitutional: Appears well-developed and well-nourished. No distress.  CVS: RRR, S1/S2 + Pulmonary: Coarse breath sounds, diminished, no wheezing Abdominal: Soft. BS +,  no distension, tenderness, rebound or guarding.  Musculoskeletal: Normal range of motion. No edema and no tenderness.  Lymphadenopathy: No lymphadenopathy noted, cervical, inguinal. Neuro: Alert. No focal deficits, disoriented Skin: Skin is warm and dry. Psychiatric: Normal mood and affect.    Data Reviewed: I have personally reviewed following labs and imaging studies  CBC: Recent Labs  Lab 09/30/17 1400 10/01/17 0556 10/02/17 0646  WBC 8.4 8.1 10.3  NEUTROABS 5.3  --   --   HGB 13.3 11.6* 12.1  HCT 41.3 37.1 38.6  MCV 94.7 96.6 94.4  PLT 255 236 246   Basic Metabolic Panel: Recent Labs  Lab 09/30/17 1400 10/01/17 0556 10/02/17 0646  NA 138 140 136  K 3.5 3.5 3.4*  CL 102 109 103  CO2 30 26 25   GLUCOSE 112* 103* 97  BUN 18 11 7   CREATININE 0.92 0.68 0.68  CALCIUM 8.7* 7.9* 8.1*   GFR: Estimated Creatinine Clearance: 49.7 mL/min (by C-G formula based on SCr of 0.68 mg/dL).  Liver Function Tests: Recent Labs  Lab 09/30/17 1400  AST 16  ALT 9*  ALKPHOS 57  BILITOT 1.0  PROT 7.3  ALBUMIN 3.1*   No results for input(s): LIPASE, AMYLASE in the last 168 hours. No results for input(s): AMMONIA in the last 168 hours. Coagulation Profile: Recent Labs  Lab 09/30/17 1500  INR 1.00   Cardiac Enzymes: No results for input(s): CKTOTAL, CKMB, CKMBINDEX, TROPONINI in the last 168 hours. BNP (last 3 results) No results for input(s): PROBNP in the last 8760 hours. HbA1C: No results for input(s): HGBA1C in the last 72 hours. CBG: No results for input(s): GLUCAP in the last 168 hours. Lipid Profile: No  results for input(s): CHOL, HDL, LDLCALC, TRIG, CHOLHDL, LDLDIRECT in the last 72 hours. Thyroid Function Tests: No results for input(s): TSH, T4TOTAL, FREET4, T3FREE, THYROIDAB in the last 72 hours. Anemia Panel: No results for input(s): VITAMINB12, FOLATE, FERRITIN, TIBC, IRON, RETICCTPCT in the last 72 hours. Urine analysis:    Component Value Date/Time   COLORURINE AMBER (A) 09/30/2017 1730   APPEARANCEUR CLOUDY (A) 09/30/2017 1730   LABSPEC 1.032 (H) 09/30/2017 1730   PHURINE 5.0 09/30/2017 1730   GLUCOSEU NEGATIVE 09/30/2017 1730   HGBUR NEGATIVE 09/30/2017 1730   BILIRUBINUR SMALL (A) 09/30/2017 1730   KETONESUR NEGATIVE 09/30/2017 1730   PROTEINUR 30 (A) 09/30/2017 1730   UROBILINOGEN 1.0 11/10/2013 2130   NITRITE NEGATIVE 09/30/2017 1730   LEUKOCYTESUR LARGE (A) 09/30/2017 1730   Sepsis Labs: @LABRCNTIP (procalcitonin:4,lacticidven:4)    Culture, blood (Routine x 2)     Status: None (Preliminary result)   Collection Time: 09/30/17  2:00 PM  Result Value Ref Range Status   Specimen Description BLOOD RIGHT FOREARM  Final   Special Requests   Final    BOTTLES DRAWN AEROBIC AND ANAEROBIC Blood Culture results may not be optimal due to an inadequate volume of blood received in culture bottles   Culture   Final    NO GROWTH < 24 HOURS    Report Status PENDING  Incomplete  Culture, blood (Routine x 2)     Status: None (Preliminary result)   Collection Time: 09/30/17  2:05 PM  Result Value Ref Range Status   Specimen Description BLOOD LEFT ANTECUBITAL  Final   Special Requests   Final    BOTTLES DRAWN AEROBIC AND ANAEROBIC Blood Culture adequate volume   Culture   Final    NO GROWTH < 24 HOURS    Report Status PENDING  Incomplete  Culture, sputum-assessment     Status: None   Collection Time: 09/30/17  7:05 PM  Result Value Ref Range Status   Specimen Description SPUTUM  Final   Special Requests NONE  Final   Sputum evaluation THIS SPECIMEN IS ACCEPTABLE FOR SPUTUM  CULTURE  Final   Report Status 09/30/2017 FINAL  Final  Culture, respiratory (NON-Expectorated)     Status: None (Preliminary result)   Collection Time: 09/30/17  7:05 PM  Result Value Ref Range Status   Specimen Description SPUTUM  Final   Special Requests NONE Reflexed from [id number]  Final   Gram Stain   Final    ABUNDANT WBC PRESENT, PREDOMINANTLY PMN FEW GRAM POSITIVE COCCI IN PAIRS    Culture PENDING  Incomplete   Report Status PENDING  Incomplete  Respiratory Panel by PCR     Status: Abnormal   Collection Time: 09/30/17  7:21 PM  Result Value Ref Range Status   Adenovirus NOT DETECTED NOT DETECTED Final  Coronavirus 229E NOT DETECTED NOT DETECTED Final   Coronavirus HKU1 NOT DETECTED NOT DETECTED Final   Coronavirus NL63 NOT DETECTED NOT DETECTED Final   Coronavirus OC43 NOT DETECTED NOT DETECTED Final   Metapneumovirus NOT DETECTED NOT DETECTED Final   Rhinovirus / Enterovirus DETECTED (A) NOT DETECTED Final   Influenza A NOT DETECTED NOT DETECTED Final   Influenza B NOT DETECTED NOT DETECTED Final   Parainfluenza Virus 1 NOT DETECTED NOT DETECTED Final   Parainfluenza Virus 2 NOT DETECTED NOT DETECTED Final   Parainfluenza Virus 3 NOT DETECTED NOT DETECTED Final   Parainfluenza Virus 4 NOT DETECTED NOT DETECTED Final   Respiratory Syncytial Virus NOT DETECTED NOT DETECTED Final   Bordetella pertussis NOT DETECTED NOT DETECTED Final   Chlamydophila pneumoniae NOT DETECTED NOT DETECTED Final   Mycoplasma pneumoniae NOT DETECTED NOT DETECTED Final    Comment: Performed at [facility], [address] [place], [place] [address]  MRSA PCR Screening     Status: None   Collection Time: 09/30/17  7:36 PM  Result Value Ref Range Status   MRSA by PCR NEGATIVE NEGATIVE Final      Radiology Studies: Dg Chest 2 View Result Date: 09/30/2017 1. Patchy right lung opacities concerning for pneumonia. 2. Chronic interstitial lung disease.    Scheduled Meds: . aspirin  325 mg  Oral Daily  . benzonatate  100 mg Oral Daily  . guaiFENesin  600 mg Oral BID  . ipratropium  0.5 mg Nebulization Q6H  . levalbuterol  0.63 mg Nebulization Q6H  . metoprolol tartrate  50 mg Oral BID  . pantoprazole  40 mg Oral Daily  . PARoxetine  10 mg Oral Daily   Continuous Infusions: . sodium chloride 75 mL/hr at 10/01/17 2351  . ceFEPime (MAXIPIME) IV 1 g (10/02/17 0955)  . vancomycin Stopped (10/01/17 1431)     LOS: 2 days    Time spent: 25 minutes Greater than 50% of the time spent on counseling and coordinating the care.   Manson PasseyAlma Domenique Southers, MD [facility] Pager [phone]  If 7PM-7AM, please contact night-coverage [email or web address] Password [email or web address] 10/02/2017, 10:06 AM

## 2017-10-02 NOTE — Progress Notes (Signed)
Heart rate has been 110's-150's entire shift despite patient getting her schedule lopressor, and PRN IV lopressor. She's is resting quietly in bed on 3L O2  with eyes closed. Nightshift hospitalist has been notified. Will continue to monitor closely at this time.

## 2017-10-02 NOTE — Progress Notes (Signed)
2.5 mg IV Lopressor given.

## 2017-10-03 LAB — CULTURE, RESPIRATORY W GRAM STAIN

## 2017-10-03 LAB — CULTURE, RESPIRATORY

## 2017-10-03 LAB — TSH: TSH: 2.865 u[IU]/mL (ref 0.350–4.500)

## 2017-10-03 LAB — T4, FREE: Free T4: 0.9 ng/dL (ref 0.61–1.12)

## 2017-10-03 MED ORDER — LEVALBUTEROL HCL 0.63 MG/3ML IN NEBU
0.6300 mg | INHALATION_SOLUTION | Freq: Three times a day (TID) | RESPIRATORY_TRACT | Status: DC
  Administered 2017-10-03 – 2017-10-09 (×21): 0.63 mg via RESPIRATORY_TRACT
  Filled 2017-10-03 (×21): qty 3

## 2017-10-03 MED ORDER — DIGOXIN 0.25 MG/ML IJ SOLN
0.5000 mg | Freq: Once | INTRAMUSCULAR | Status: AC
  Administered 2017-10-03: 0.5 mg via INTRAVENOUS
  Filled 2017-10-03: qty 2

## 2017-10-03 MED ORDER — DILTIAZEM HCL 25 MG/5ML IV SOLN
10.0000 mg | Freq: Once | INTRAVENOUS | Status: AC
  Administered 2017-10-03: 10 mg via INTRAVENOUS
  Filled 2017-10-03: qty 5

## 2017-10-03 MED ORDER — DILTIAZEM HCL ER COATED BEADS 180 MG PO CP24
180.0000 mg | ORAL_CAPSULE | Freq: Every day | ORAL | Status: DC
  Administered 2017-10-03 – 2017-10-05 (×3): 180 mg via ORAL
  Filled 2017-10-03 (×3): qty 1

## 2017-10-03 MED ORDER — DILTIAZEM HCL-DEXTROSE 100-5 MG/100ML-% IV SOLN (PREMIX)
5.0000 mg/h | INTRAVENOUS | Status: DC
  Filled 2017-10-03: qty 100

## 2017-10-03 MED ORDER — DILTIAZEM HCL 100 MG IV SOLR
5.0000 mg/h | INTRAVENOUS | Status: DC
  Administered 2017-10-03: 5 mg/h via INTRAVENOUS
  Filled 2017-10-03: qty 100

## 2017-10-03 MED ORDER — IPRATROPIUM BROMIDE 0.02 % IN SOLN
0.5000 mg | Freq: Three times a day (TID) | RESPIRATORY_TRACT | Status: DC
  Administered 2017-10-03 – 2017-10-09 (×21): 0.5 mg via RESPIRATORY_TRACT
  Filled 2017-10-03 (×21): qty 2.5

## 2017-10-03 NOTE — Progress Notes (Signed)
HR sustaining in 140's-150's after a 500 cc bolus and 5mg  of IV lopressor. Hospitalist paged again. 10 mg cardizem ordered and administered. Will continue to monitor pt closely at this time.

## 2017-10-03 NOTE — Progress Notes (Signed)
Called to pt room d/t MD had placed orders to transfer pt to SD to start a cardizem drip for HR in 120's.  EKG done to confirm pt in A-Fib.  Temp 98.9, HR 125, Sats 94% on 3 LNC, RR 31.  Pt confused, oriented x 1.  Pt started on a cardizem drip and transferred to ICU/SD.

## 2017-10-03 NOTE — Progress Notes (Signed)
Patient ID: Brooke Phelps, female   DOB: [birth year], 83 y.o.   MRN: [id number]  PROGRESS NOTE    Brooke Phelps  NWG:[id number] DOB: [birth year] DOA: 09/30/2017  PCP: [name], [name], [name]   Brief Narrative:  83 y.o. female with known HTN, dementia, [facility] with main concern of ongoing fevers, exertional dyspnea, productive cough of clear sputum. Pt was febrile on admission. CXR showed right lung opacity. Pt was hypoxic which improved with oxygen support. Pt started on empiric abx while awaiting culture results.  --------------------------------------------------------------------------------------------------------------------------------------  Assessment & Plan:   Active Problems:   HCAP (healthcare-associated pneumonia) / Rhinovirus (+) -improving - Pt has right lung opacity on CXR - Resp virus panel positive for rhinovirus - Blood cx negative so far negative  - Strep pneumonia is negative - Legionella is pending - Respiratory culture pending -sputum revealing abundant WBCs as needed, beta-lactamase positive - Continue vanco and cefepime     Essential hypertension - Continue metoprolol BID and hydralazine PRN    Acute hypoxic respiratory failure with hypoxia - Secondary to pneumonia - Continue current nebulizer treatment, O2 via nasal cannula,    Dementia without behavioral disturbance  - Stable mental status, no changes since the admission    Functional quadriplegia - Wheelchair and bed bound  - Stable    Possible UTI -  Continue current antibiotics while awaiting urine culture results    DVT prophylaxis: SCDs  Code Status: DNR/DNI Family Communication:  no family at the bedside Disposition Plan:  obtain PT evaluation    Consultants:   Physical therapy  Procedures:   None  Antimicrobials:   Vanco and cefepime 11/29 -->   Subjective: No overnight events.   Objective: Vitals:   10/03/17 0530  10/03/17 0600 10/03/17 0630 10/03/17 0752  BP: (!) 164/63 (!) 178/62 (!) 171/84   Pulse: 69 65 65 65  Resp: (!) 34 (!) 26 (!) 24 20  Temp:      TempSrc:      SpO2: 91% 93% 93% 92%  Weight:      Height:        Intake/Output Summary (Last 24 hours) at 10/03/2017 0756 Last data filed at 10/03/2017 0600 Gross per 24 hour  Intake 1911.75 ml  Output 250 ml  Net 1661.75 ml   Filed Weights   10/01/17 1029 10/03/17 0425  Weight: 76.9 kg (169 lb 8.5 oz) 80.8 kg (178 lb 2.1 oz)    Physical Exam  Constitutional: No acute distress, confused, mildly lethargic.  CVS: RRR, S1/S2 + Pulmonary: Coarse breath sounds, diminished, no wheezing Abdominal: Soft. BS +,  no distension, tenderness, rebound or guarding.  Musculoskeletal: Normal range of motion. No edema and no tenderness.  Lymphadenopathy: No lymphadenopathy noted, cervical, inguinal. Neuro: Alert. No focal deficits, disoriented Skin: Skin is warm and dry. Psychiatric: Normal mood and affect.    Data Reviewed: I have personally reviewed following labs and imaging studies  CBC: Recent Labs  Lab 09/30/17 1400 10/01/17 0556 10/02/17 0646  WBC 8.4 8.1 10.3  NEUTROABS 5.3  --   --   HGB 13.3 11.6* 12.1  HCT 41.3 37.1 38.6  MCV 94.7 96.6 94.4  PLT 255 236 246   Basic Metabolic Panel: Recent Labs  Lab 09/30/17 1400 10/01/17 0556 10/02/17 0646  NA 138 140 136  K 3.5 3.5 3.4*  CL 102 109 103  CO2 30 26 25   GLUCOSE 112* 103* 97  BUN 18 11 7   CREATININE  0.92 0.68 0.68  CALCIUM 8.7* 7.9* 8.1*   GFR: Estimated Creatinine Clearance: 50.9 mL/min (by C-G formula based on SCr of 0.68 mg/dL). Liver Function Tests: Recent Labs  Lab 09/30/17 1400  AST 16  ALT 9*  ALKPHOS 57  BILITOT 1.0  PROT 7.3  ALBUMIN 3.1*   No results for input(s): LIPASE, AMYLASE in the last 168 hours. No results for input(s): AMMONIA in the last 168 hours. Coagulation Profile: Recent Labs  Lab 09/30/17 1500  INR 1.00   Cardiac Enzymes: No  results for input(s): CKTOTAL, CKMB, CKMBINDEX, TROPONINI in the last 168 hours. BNP (last 3 results) No results for input(s): PROBNP in the last 8760 hours. HbA1C: No results for input(s): HGBA1C in the last 72 hours. CBG: No results for input(s): GLUCAP in the last 168 hours. Lipid Profile: No results for input(s): CHOL, HDL, LDLCALC, TRIG, CHOLHDL, LDLDIRECT in the last 72 hours. Thyroid Function Tests: No results for input(s): TSH, T4TOTAL, FREET4, T3FREE, THYROIDAB in the last 72 hours. Anemia Panel: No results for input(s): VITAMINB12, FOLATE, FERRITIN, TIBC, IRON, RETICCTPCT in the last 72 hours. Urine analysis:    Component Value Date/Time   COLORURINE AMBER (A) 09/30/2017 1730   APPEARANCEUR CLOUDY (A) 09/30/2017 1730   LABSPEC 1.032 (H) 09/30/2017 1730   PHURINE 5.0 09/30/2017 1730   GLUCOSEU NEGATIVE 09/30/2017 1730   HGBUR NEGATIVE 09/30/2017 1730   BILIRUBINUR SMALL (A) 09/30/2017 1730   KETONESUR NEGATIVE 09/30/2017 1730   PROTEINUR 30 (A) 09/30/2017 1730   UROBILINOGEN 1.0 11/10/2013 2130   NITRITE NEGATIVE 09/30/2017 1730   LEUKOCYTESUR LARGE (A) 09/30/2017 1730   Sepsis Labs: @LABRCNTIP (procalcitonin:4,lacticidven:4)    Culture, blood (Routine x 2)     Status: None (Preliminary result)   Collection Time: 09/30/17  2:00 PM  Result Value Ref Range Status   Specimen Description BLOOD RIGHT FOREARM  Final   Special Requests   Final    BOTTLES DRAWN AEROBIC AND ANAEROBIC Blood Culture results may not be optimal due to an inadequate volume of blood received in culture bottles   Culture   Final    NO GROWTH < 24 HOURS    Report Status PENDING  Incomplete  Culture, blood (Routine x 2)     Status: None (Preliminary result)   Collection Time: 09/30/17  2:05 PM  Result Value Ref Range Status   Specimen Description BLOOD LEFT ANTECUBITAL  Final   Special Requests   Final    BOTTLES DRAWN AEROBIC AND ANAEROBIC Blood Culture adequate volume   Culture   Final     NO GROWTH < 24 HOURS    Report Status PENDING  Incomplete  Culture, sputum-assessment     Status: None   Collection Time: 09/30/17  7:05 PM  Result Value Ref Range Status   Specimen Description SPUTUM  Final   Special Requests NONE  Final   Sputum evaluation THIS SPECIMEN IS ACCEPTABLE FOR SPUTUM CULTURE  Final   Report Status 09/30/2017 FINAL  Final  Culture, respiratory (NON-Expectorated)     Status: None (Preliminary result)   Collection Time: 09/30/17  7:05 PM  Result Value Ref Range Status   Specimen Description SPUTUM  Final   Special Requests NONE Reflexed from [id number]  Final   Gram Stain   Final    ABUNDANT WBC PRESENT, PREDOMINANTLY PMN FEW GRAM POSITIVE COCCI IN PAIRS    Culture PENDING  Incomplete   Report Status PENDING  Incomplete  Respiratory Panel by PCR  Status: Abnormal   Collection Time: 09/30/17  7:21 PM  Result Value Ref Range Status   Adenovirus NOT DETECTED NOT DETECTED Final   Coronavirus 229E NOT DETECTED NOT DETECTED Final   Coronavirus HKU1 NOT DETECTED NOT DETECTED Final   Coronavirus NL63 NOT DETECTED NOT DETECTED Final   Coronavirus OC43 NOT DETECTED NOT DETECTED Final   Metapneumovirus NOT DETECTED NOT DETECTED Final   Rhinovirus / Enterovirus DETECTED (A) NOT DETECTED Final   Influenza A NOT DETECTED NOT DETECTED Final   Influenza B NOT DETECTED NOT DETECTED Final   Parainfluenza Virus 1 NOT DETECTED NOT DETECTED Final   Parainfluenza Virus 2 NOT DETECTED NOT DETECTED Final   Parainfluenza Virus 3 NOT DETECTED NOT DETECTED Final   Parainfluenza Virus 4 NOT DETECTED NOT DETECTED Final   Respiratory Syncytial Virus NOT DETECTED NOT DETECTED Final   Bordetella pertussis NOT DETECTED NOT DETECTED Final   Chlamydophila pneumoniae NOT DETECTED NOT DETECTED Final   Mycoplasma pneumoniae NOT DETECTED NOT DETECTED Final    Comment: Performed at [facility], [address], [place], [place] [address]  MRSA PCR Screening     Status: None    Collection Time: 09/30/17  7:36 PM  Result Value Ref Range Status   MRSA by PCR NEGATIVE NEGATIVE Final      Radiology Studies: Dg Chest 2 View Result Date: 09/30/2017 1. Patchy right lung opacities concerning for pneumonia. 2. Chronic interstitial lung disease.    Scheduled Meds: . aspirin  325 mg Oral Daily  . benzonatate  100 mg Oral Daily  . guaiFENesin  600 mg Oral BID  . ipratropium  0.5 mg Nebulization TID  . levalbuterol  0.63 mg Nebulization TID  . metoprolol tartrate  50 mg Oral BID  . pantoprazole  40 mg Oral Daily  . PARoxetine  10 mg Oral Daily   Continuous Infusions: . sodium chloride 75 mL/hr at 10/03/17 0400  . ceFEPime (MAXIPIME) IV Stopped (10/02/17 2149)  . diltiazem (CARDIZEM) infusion 10 mg/hr (10/03/17 0530)  . vancomycin Stopped (10/02/17 1552)     LOS: 3 days    Time spent: 35 minutes Greater than 50% of the time spent on counseling and coordinating the care.   Kendell Bane, MD [facility] Pager [phone]  If 7PM-7AM, please contact night-coverage [email or web address] Password [facility] 10/03/2017, 7:56 AM

## 2017-10-03 NOTE — Progress Notes (Signed)
Cardizem lowered heart rate for a moment, HR rose back up to 140's-150's. Hospitalist notified. 0.5 mg IV digoxin ordered and administered at 0216. Heart rate at this time is sustaining in the 140's. Will continue to monitor closely at this time.

## 2017-10-03 NOTE — Plan of Care (Signed)
  Progressing Education: Knowledge of General Education information will improve 10/03/2017 1855 - Progressing by Burna SisZavaleta Catalan, Rocio Roam G, RN Clinical Measurements: Ability to maintain clinical measurements within normal limits will improve 10/03/2017 1855 - Progressing by Burna SisZavaleta Catalan, Yostin Malacara G, RN Will remain free from infection 10/03/2017 1855 - Progressing by Burna SisZavaleta Catalan, Avalee Castrellon G, RN Diagnostic test results will improve 10/03/2017 1855 - Progressing by Burna SisZavaleta Catalan, Braelynn Lupton G, RN Respiratory complications will improve 10/03/2017 1855 - Progressing by Burna SisZavaleta Catalan, Granite Godman G, RN Cardiovascular complication will be avoided 10/03/2017 1855 - Progressing by Burna SisZavaleta Catalan, Dorothymae Maciver G, RN Activity: Risk for activity intolerance will decrease 10/03/2017 1855 - Progressing by Burna SisZavaleta Catalan, Annye Forrey G, RN Nutrition: Adequate nutrition will be maintained 10/03/2017 1855 - Progressing by Burna SisZavaleta Catalan, Pelham Hennick G, RN Coping: Level of anxiety will decrease 10/03/2017 1855 - Progressing by Burna SisZavaleta Catalan, Kynedi Profitt G, RN Elimination: Will not experience complications related to bowel motility 10/03/2017 1855 - Progressing by Burna SisZavaleta Catalan, Daymond Cordts G, RN Will not experience complications related to urinary retention 10/03/2017 1855 - Progressing by Burna SisZavaleta Catalan, Marybeth Dandy G, RN Pain Managment: General experience of comfort will improve 10/03/2017 1855 - Progressing by Burna SisZavaleta Catalan, Dominga Mcduffie G, RN Safety: Ability to remain free from injury will improve 10/03/2017 1855 - Progressing by Burna SisZavaleta Catalan, Jovaun Levene G, RN Skin Integrity: Risk for impaired skin integrity will decrease 10/03/2017 1855 - Progressing by Burna SisZavaleta Catalan, Sylvain Hasten G, RN   Adequate for Discharge Health Behavior/Discharge Planning: Ability to manage health-related needs will improve 10/03/2017 1855 - Adequate for Discharge by Burna SisZavaleta Catalan, Arionne Iams G, RN

## 2017-10-03 NOTE — Progress Notes (Signed)
Heart rate running 90-140's. hospitalist notified. cardizem drip ordered. Rapid response came to assess pt, EKG obtained reading a-fib with RVR. Pt transported via bed to ICU, report given. [name], son, notified that pt was transferred.

## 2017-10-04 ENCOUNTER — Encounter (HOSPITAL_COMMUNITY): Payer: Self-pay | Admitting: Family Medicine

## 2017-10-04 LAB — BASIC METABOLIC PANEL
Anion gap: 8 (ref 5–15)
BUN: 5 mg/dL — AB (ref 6–20)
CHLORIDE: 101 mmol/L (ref 101–111)
CO2: 27 mmol/L (ref 22–32)
CREATININE: 0.6 mg/dL (ref 0.44–1.00)
Calcium: 8.3 mg/dL — ABNORMAL LOW (ref 8.9–10.3)
GFR calc non Af Amer: >60 mL/min (ref >60)
Glucose, Bld: 134 mg/dL — ABNORMAL HIGH (ref 65–99)
Potassium: 3.1 mmol/L — ABNORMAL LOW (ref 3.5–5.1)
SODIUM: 136 mmol/L (ref 135–145)

## 2017-10-04 LAB — CBC
HEMATOCRIT: 37.2 % (ref 36.0–46.0)
HEMOGLOBIN: 11.9 g/dL — AB (ref 12.0–15.0)
MCH: 29.8 pg (ref 26.0–34.0)
MCHC: 32 g/dL (ref 30.0–36.0)
MCV: 93.2 fL (ref 78.0–100.0)
Platelets: 303 10*3/uL (ref 150–400)
RBC: 3.99 MIL/uL (ref 3.87–5.11)
RDW: 13.8 % (ref 11.5–15.5)
WBC: 7.7 10*3/uL (ref 4.0–10.5)

## 2017-10-04 LAB — LEGIONELLA PNEUMOPHILA SEROGP 1 UR AG: L. PNEUMOPHILA SEROGP 1 UR AG: NEGATIVE

## 2017-10-04 LAB — T3, FREE: T3, Free: 1.8 pg/mL — ABNORMAL LOW (ref 2.0–4.4)

## 2017-10-04 MED ORDER — POLYETHYLENE GLYCOL 3350 17 G PO PACK
17.0000 g | PACK | Freq: Three times a day (TID) | ORAL | Status: DC
  Administered 2017-10-04 – 2017-10-08 (×4): 17 g via ORAL
  Filled 2017-10-04 (×4): qty 1

## 2017-10-04 MED ORDER — DOCUSATE SODIUM 100 MG PO CAPS
100.0000 mg | ORAL_CAPSULE | Freq: Every day | ORAL | Status: DC
  Administered 2017-10-05: 100 mg via ORAL
  Filled 2017-10-04 (×2): qty 1

## 2017-10-04 MED ORDER — HEPARIN SODIUM (PORCINE) 5000 UNIT/ML IJ SOLN
5000.0000 [IU] | Freq: Three times a day (TID) | INTRAMUSCULAR | Status: DC
  Administered 2017-10-04 – 2017-10-09 (×17): 5000 [IU] via SUBCUTANEOUS
  Filled 2017-10-04 (×17): qty 1

## 2017-10-04 MED ORDER — GUAIFENESIN-DM 100-10 MG/5ML PO SYRP
10.0000 mL | ORAL_SOLUTION | Freq: Four times a day (QID) | ORAL | Status: DC
  Administered 2017-10-04 – 2017-10-09 (×21): 10 mL via ORAL
  Filled 2017-10-04 (×20): qty 10

## 2017-10-04 MED ORDER — HYDRALAZINE HCL 50 MG PO TABS
50.0000 mg | ORAL_TABLET | Freq: Four times a day (QID) | ORAL | Status: DC
  Administered 2017-10-04 – 2017-10-09 (×19): 50 mg via ORAL
  Filled 2017-10-04 (×21): qty 1

## 2017-10-04 MED ORDER — ACETAMINOPHEN 325 MG PO TABS
650.0000 mg | ORAL_TABLET | Freq: Four times a day (QID) | ORAL | Status: DC | PRN
  Administered 2017-10-04: 650 mg via ORAL
  Filled 2017-10-04: qty 2

## 2017-10-04 MED ORDER — BENZONATATE 100 MG PO CAPS
100.0000 mg | ORAL_CAPSULE | Freq: Three times a day (TID) | ORAL | Status: DC
  Administered 2017-10-04 – 2017-10-09 (×10): 100 mg via ORAL
  Filled 2017-10-04 (×14): qty 1

## 2017-10-04 MED ORDER — METOPROLOL TARTRATE 50 MG PO TABS
100.0000 mg | ORAL_TABLET | Freq: Two times a day (BID) | ORAL | Status: DC
  Administered 2017-10-05 – 2017-10-09 (×10): 100 mg via ORAL
  Filled 2017-10-04: qty 4
  Filled 2017-10-04 (×2): qty 2
  Filled 2017-10-04 (×6): qty 4
  Filled 2017-10-04: qty 2
  Filled 2017-10-04: qty 4

## 2017-10-04 MED ORDER — POTASSIUM CHLORIDE CRYS ER 20 MEQ PO TBCR
20.0000 meq | EXTENDED_RELEASE_TABLET | Freq: Two times a day (BID) | ORAL | Status: AC
  Administered 2017-10-04 (×2): 20 meq via ORAL
  Filled 2017-10-04 (×2): qty 1

## 2017-10-04 MED ORDER — ORAL CARE MOUTH RINSE
15.0000 mL | Freq: Two times a day (BID) | OROMUCOSAL | Status: DC
  Administered 2017-10-04 – 2017-10-09 (×12): 15 mL via OROMUCOSAL

## 2017-10-04 MED ORDER — ACETAMINOPHEN 650 MG RE SUPP
650.0000 mg | Freq: Four times a day (QID) | RECTAL | Status: DC | PRN
  Administered 2017-10-04: 650 mg via RECTAL
  Filled 2017-10-04: qty 1

## 2017-10-04 MED ORDER — METOPROLOL TARTRATE 5 MG/5ML IV SOLN
2.5000 mg | Freq: Once | INTRAVENOUS | Status: AC
  Administered 2017-10-04: 2.5 mg via INTRAVENOUS

## 2017-10-04 NOTE — Plan of Care (Signed)
  Progressing Education: Knowledge of General Education information will improve 10/04/2017 1939 - Progressing by Burna SisZavaleta Catalan, Klayton Monie G, RN Clinical Measurements: Ability to maintain clinical measurements within normal limits will improve 10/04/2017 1939 - Progressing by Burna SisZavaleta Catalan, Giani Betzold G, RN Will remain free from infection 10/04/2017 1939 - Progressing by Burna SisZavaleta Catalan, Shahmeer Bunn G, RN Diagnostic test results will improve 10/04/2017 1939 - Progressing by Burna SisZavaleta Catalan, Vale Mousseau G, RN Respiratory complications will improve 10/04/2017 1939 - Progressing by Burna SisZavaleta Catalan, Jaquasha Carnevale G, RN Activity: Risk for activity intolerance will decrease 10/04/2017 1939 - Progressing by Burna SisZavaleta Catalan, Jodell Weitman G, RN Coping: Level of anxiety will decrease 10/04/2017 1939 - Progressing by Burna SisZavaleta Catalan, Aboubacar Matsuo G, RN Elimination: Will not experience complications related to bowel motility 10/04/2017 1939 - Progressing by Burna SisZavaleta Catalan, Libero Puthoff G, RN Will not experience complications related to urinary retention 10/04/2017 1939 - Progressing by Burna SisZavaleta Catalan, Joclynn Lumb G, RN Pain Managment: General experience of comfort will improve 10/04/2017 1939 - Progressing by Burna SisZavaleta Catalan, Gabor Lusk G, RN Safety: Ability to remain free from injury will improve 10/04/2017 1939 - Progressing by Burna SisZavaleta Catalan, Maxmillian Carsey G, RN Skin Integrity: Risk for impaired skin integrity will decrease 10/04/2017 1939 - Progressing by Burna SisZavaleta Catalan, Gurtaj Ruz G, RN   Not Progressing Clinical Measurements: Cardiovascular complication will be avoided 10/04/2017 1939 - Not Progressing by Burna SisZavaleta Catalan, Trae Bovenzi G, RN Pt went into Afib Nutrition: Adequate nutrition will be maintained 10/04/2017 1939 - Not Progressing by Burna SisZavaleta Catalan, Darchelle Nunes G, RN Pt is not eating well   Adequate for Discharge Health Behavior/Discharge Planning: Ability to manage health-related needs will improve 10/04/2017 1939 -  Adequate for Discharge by Burna SisZavaleta Catalan, Giannis Corpuz G, RN

## 2017-10-04 NOTE — Progress Notes (Addendum)
Patient ID: Brooke Ferrarivelyn R Rosario, female   DOB: 1927-12-31, 83 y.o.   MRN: [id number]  PROGRESS NOTE    Brooke Phelps  BJY:[id number] DOB: 1927-12-31 DOA: 09/30/2017  PCP: [name], [name], [name]    Subjective: Patient was seen and examined, stable in no acute distress No issues overnight with exception of temp of 100.4 this a.m. Per nursing staff she is tolerating some oral medication, but believe that she would benefit from speech evaluation for possible aspiration.    Brief Narrative:  83 y.o. female with known HTN, dementia, [facility] with main concern of ongoing fevers, exertional dyspnea, productive cough of clear sputum. Pt was febrile on admission. CXR showed right lung opacity. Pt was hypoxic which improved with oxygen support. Pt started on empiric abx while awaiting culture results.  --------------------------------------------------------------------------------------------------------------------------------------  Assessment & Plan:   Active Problems:   HCAP (healthcare-associated pneumonia) / Rhinovirus (+) -improving - Pt has right lung opacity on CXR - Resp  virus panel positive for rhinovirus - Blood cx negative so far negative  - Strep pneumonia is negative - Legionella is pending - Respiratory culture pending -sputum revealing abundant WBCs as needed, beta-lactamase positive - Was on  vanco and cefepime, Vanco was DC'd on 10/03/2017 -Continue to have low-grade fever, will follow the cultures, believing this is more of a viral response, as needed Tylenol added    Essential hypertension -Blood pressure still elevated, continue metoprolol, added hydralazine along with as needed IV hydralazin.  We will continue to monitor    Acute hypoxic respiratory failure with hypoxia - Secondary to pneumonia -RT consult of continuous pulmonary toiletry, - Continue current nebulizer treatment, O2 via nasal cannula,    Dementia without  behavioral disturbance  - Stable mental status, no changes since the admission    Functional quadriplegia - Wheelchair and bed bound  - Stable    Possible UTI -  Continue current antibiotics while awaiting urine culture results   Hypokalemia  -Monitoring, rebleeding, check a magnesium   History of paroxysmal A. fib/A flutter - patient is currently back in normal sinus rhythm,  patient was briefly on Cardizem drip, was switched to p.o., continued on metoprolol, full dose aspirin Given the risk and benefit of chronic anticoagulation along with her comorbidities and age, we discussed in detail with the patient's son, POA he has expressed understanding and agreement to currently continue the patient only on aspirin.   DVT prophylaxis: SCDs/heparin SQ Code Status: DNR/DNI Family Communication:  no family at the bedside Disposition Plan:  obtain PT evaluation    Consultants:   Physical therapy  Procedures:   None  Antimicrobials:   Vanco and cefepime 11/29 --> vancomycin DC'd on 10/03/2017,    Objective: Vitals:   10/04/17 0601 10/04/17 0800 10/04/17 0801 10/04/17 0900  BP: (!) 181/54 (!) 181/53 (!) 181/53 (!) 160/39  Pulse:  78  84  Resp:  (!) 26  (!) 31  Temp:  (!) 100.4 F (38 C)    TempSrc:  Axillary    SpO2:  (!) 89%  91%  Weight:      Height:        Intake/Output Summary (Last 24 hours) at 10/04/2017 1018 Last data filed at 10/04/2017 0600 Gross per 24 hour  Intake 234.08 ml  Output 1550 ml  Net -1315.92 ml   Filed Weights   10/01/17 1029 10/03/17 0425  Weight: 76.9 kg (169 lb 8.5 oz) 80.8 kg (178 lb 2.1 oz)  Physical Exam  BP (!) 160/39   Pulse 84   Temp (!) 100.4 F (38 C) (Axillary)   Resp (!) 31   Ht 5\' 4"  (1.626 m)   Wt 80.8 kg (178 lb 2.1 oz)   SpO2 91%   BMI 30.58 kg/m  General appearance: alert, appears older than stated age, no distress, moderately obese, pale and slowed mentation        Head:   Traumatic normocephalic  Eyes:     PERRL, conjunctiva/corneas clear, EOM's intact, fundi    benign, both eyes  Ears:    Normal TM's and external ear canals, both ears  Nose:   Nares normal, septum midline, mucosa normal, no drainage    or sinus tenderness  Throat:   Lips, mucosa, and tongue normal; teeth and gums normal  Neck:   Supple, symmetrical, trachea midline, no adenopathy;    thyroid:  no enlargement/tenderness/nodules; no carotid   bruit or JVD  Back:     Symmetric, no curvature, ROM normal, no CVA tenderness  Lungs:    Coarse, mild diffuse rhonchi, poor air exchange and lower lobes  Chest Wall:    No tenderness or deformity   Heart:    Regular rate and rhythm, S1 and S2 normal, no murmur, rub   or gallop  Breast Exam:    No tenderness, masses, or nipple abnormality  Abdomen:     Soft, non-tender, bowel sounds active all four quadrants,    no masses, no organomegaly        Extremities:   Extremities normal, atraumatic, no cyanosis or edema  Pulses:   2+ and symmetric all extremities  Skin:   Skin color, texture, turgor normal, no rashes or lesions  Lymph nodes:   Cervical, supraclavicular, and axillary nodes normal  Neurologic:   CNII-XII intact, normal strength, sensation and reflexes    throughout      Data Reviewed: I have personally reviewed following labs and imaging studies  CBC: Recent Labs  Lab 09/30/17 1400 10/01/17 0556 10/02/17 0646 10/04/17 0318  WBC 8.4 8.1 10.3 7.7  NEUTROABS 5.3  --   --   --   HGB 13.3 11.6* 12.1 11.9*  HCT 41.3 37.1 38.6 37.2  MCV 94.7 96.6 94.4 93.2  PLT 255 236 246 303   Basic Metabolic Panel: Recent Labs  Lab 09/30/17 1400 10/01/17 0556 10/02/17 0646 10/04/17 0318  NA 138 140 136 136  K 3.5 3.5 3.4* 3.1*  CL 102 109 103 101  CO2 30 26 25 27   GLUCOSE 112* 103* 97 134*  BUN 18 11 7  5*  CREATININE 0.92 0.68 0.68 0.60  CALCIUM 8.7* 7.9* 8.1* 8.3*   GFR: Estimated Creatinine Clearance: 50.9 mL/min (by C-G formula based on SCr of 0.6 mg/dL). Liver  Function Tests: Recent Labs  Lab 09/30/17 1400  AST 16  ALT 9*  ALKPHOS 57  BILITOT 1.0  PROT 7.3  ALBUMIN 3.1*   No results for input(s): LIPASE, AMYLASE in the last 168 hours. No results for input(s): AMMONIA in the last 168 hours. Coagulation Profile: Recent Labs  Lab 09/30/17 1500  INR 1.00   Cardiac Enzymes: No results for input(s): CKTOTAL, CKMB, CKMBINDEX, TROPONINI in the last 168 hours. BNP (last 3 results) No results for input(s): PROBNP in the last 8760 hours. HbA1C: No results for input(s): HGBA1C in the last 72 hours. CBG: No results for input(s): GLUCAP in the last 168 hours. Lipid Profile: No results for input(s): CHOL, HDL, LDLCALC,  TRIG, CHOLHDL, LDLDIRECT in the last 72 hours. Thyroid Function Tests: Recent Labs    10/03/17 0815  TSH 2.865  FREET4 0.90   Anemia Panel: No results for input(s): VITAMINB12, FOLATE, FERRITIN, TIBC, IRON, RETICCTPCT in the last 72 hours. Urine analysis:    Component Value Date/Time   COLORURINE AMBER (A) 09/30/2017 1730   APPEARANCEUR CLOUDY (A) 09/30/2017 1730   LABSPEC 1.032 (H) 09/30/2017 1730   PHURINE 5.0 09/30/2017 1730   GLUCOSEU NEGATIVE 09/30/2017 1730   HGBUR NEGATIVE 09/30/2017 1730   BILIRUBINUR SMALL (A) 09/30/2017 1730   KETONESUR NEGATIVE 09/30/2017 1730   PROTEINUR 30 (A) 09/30/2017 1730   UROBILINOGEN 1.0 11/10/2013 2130   NITRITE NEGATIVE 09/30/2017 1730   LEUKOCYTESUR LARGE (A) 09/30/2017 1730   Sepsis Labs: @LABRCNTIP (procalcitonin:4,lacticidven:4)    Culture, blood (Routine x 2)     Status: None (Preliminary result)   Collection Time: 09/30/17  2:00 PM  Result Value Ref Range Status   Specimen Description BLOOD RIGHT FOREARM  Final   Special Requests   Final    BOTTLES DRAWN AEROBIC AND ANAEROBIC Blood Culture results may not be optimal due to an inadequate volume of blood received in culture bottles   Culture   Final    NO GROWTH < 24 HOURS    Report Status PENDING  Incomplete    Culture, blood (Routine x 2)     Status: None (Preliminary result)   Collection Time: 09/30/17  2:05 PM  Result Value Ref Range Status   Specimen Description BLOOD LEFT ANTECUBITAL  Final   Special Requests   Final    BOTTLES DRAWN AEROBIC AND ANAEROBIC Blood Culture adequate volume   Culture   Final    NO GROWTH < 24 HOURS    Report Status PENDING  Incomplete  Culture, sputum-assessment     Status: None   Collection Time: 09/30/17  7:05 PM  Result Value Ref Range Status   Specimen Description SPUTUM  Final   Special Requests NONE  Final   Sputum evaluation THIS SPECIMEN IS ACCEPTABLE FOR SPUTUM CULTURE  Final   Report Status 09/30/2017 FINAL  Final  Culture, respiratory (NON-Expectorated)     Status: None (Preliminary result)   Collection Time: 09/30/17  7:05 PM  Result Value Ref Range Status   Specimen Description SPUTUM  Final   Special Requests NONE Reflexed from [id number]  Final   Gram Stain   Final    ABUNDANT WBC PRESENT, PREDOMINANTLY PMN FEW GRAM POSITIVE COCCI IN PAIRS    Culture PENDING  Incomplete   Report Status PENDING  Incomplete  Respiratory Panel by PCR     Status: Abnormal   Collection Time: 09/30/17  7:21 PM  Result Value Ref Range Status   Adenovirus NOT DETECTED NOT DETECTED Final   Coronavirus 229E NOT DETECTED NOT DETECTED Final   Coronavirus HKU1 NOT DETECTED NOT DETECTED Final   Coronavirus NL63 NOT DETECTED NOT DETECTED Final   Coronavirus OC43 NOT DETECTED NOT DETECTED Final   Metapneumovirus NOT DETECTED NOT DETECTED Final   Rhinovirus / Enterovirus DETECTED (A) NOT DETECTED Final   Influenza A NOT DETECTED NOT DETECTED Final   Influenza B NOT DETECTED NOT DETECTED Final   Parainfluenza Virus 1 NOT DETECTED NOT DETECTED Final   Parainfluenza Virus 2 NOT DETECTED NOT DETECTED Final   Parainfluenza Virus 3 NOT DETECTED NOT DETECTED Final   Parainfluenza Virus 4 NOT DETECTED NOT DETECTED Final   Respiratory Syncytial Virus NOT DETECTED NOT DETECTED  Final  Bordetella pertussis NOT DETECTED NOT DETECTED Final   Chlamydophila pneumoniae NOT DETECTED NOT DETECTED Final   Mycoplasma pneumoniae NOT DETECTED NOT DETECTED Final    Comment: Performed at [facility], [address] [place], [place] [address]  MRSA PCR Screening     Status: None   Collection Time: 09/30/17  7:36 PM  Result Value Ref Range Status   MRSA by PCR NEGATIVE NEGATIVE Final      Radiology Studies: Dg Chest 2 View Result Date: 09/30/2017 1. Patchy right lung opacities concerning for pneumonia. 2. Chronic interstitial lung disease.    Scheduled Meds: . aspirin  325 mg Oral Daily  . benzonatate  100 mg Oral TID  . diltiazem  180 mg Oral Daily  . docusate sodium  100 mg Oral Daily  . guaiFENesin-dextromethorphan  10 mL Oral Q6H  . heparin injection (subcutaneous)  5,000 Units Subcutaneous Q8H  . hydrALAZINE  50 mg Oral Q6H  . ipratropium  0.5 mg Nebulization TID  . levalbuterol  0.63 mg Nebulization TID  . mouth rinse  15 mL Mouth Rinse BID  . metoprolol tartrate  50 mg Oral BID  . pantoprazole  40 mg Oral Daily  . PARoxetine  10 mg Oral Daily  . polyethylene glycol  17 g Oral Q8H   Continuous Infusions: . sodium chloride 75 mL/hr at 10/03/17 0803  . ceFEPime (MAXIPIME) IV Stopped (10/03/17 2220)     LOS: 4 days    Time spent: 35 minutes Greater than 50% of the time spent on counseling and coordinating the care.   Kendell BaneSeyed A Aleesia Henney, MD [facility] Pager [phone]  If 7PM-7AM, please contact night-coverage [email or web address] Password [email or web address] 10/04/2017, 10:18 AM

## 2017-10-04 NOTE — Progress Notes (Signed)
Date: October 04, 2017 Asusena Sigley, BSN, RN3, CCM [phone] Chart and notes review for patient progress and needs. Will follow for case management and discharge needs. Next review date: [id number] 

## 2017-10-04 NOTE — Evaluation (Signed)
Clinical/Bedside Swallow Evaluation Patient Details  Name: Brooke Phelps MRN: [id number] Date of Birth: 1928-02-25  Today's Date: 10/04/2017 Time: SLP Start Time (ACUTE ONLY): 1111 SLP Stop Time (ACUTE ONLY): 1132 SLP Time Calculation (min) (ACUTE ONLY): 21 min  Past Medical History:  Past Medical History:  Diagnosis Date  . Asthma   . Atrial fibrillation (HCC)   . COPD (chronic obstructive pulmonary disease) (HCC)   . Dementia    Past Surgical History:  Past Surgical History:  Procedure Laterality Date  . ORIF ANKLE FRACTURE Left 11/11/2013   Procedure: OPEN REDUCTION INTERNAL FIXATION (ORIF) ANKLE FRACTURE;  Surgeon: Nadara MustardMarcus V Duda, MD;  Location: [facility] OR;  Service: Orthopedics;  Laterality: Left;   HPI:  Brooke Savannahvelyn R Milleris a 83 y.o.femalewith known HTN, dementia, presented via EMS [facility] with main concern of ongoing fevers, exertional dyspnea, productive cough of clear sputum. Pt is in memory care unit and due to advanced dementia is not able to provide detailed information. Chest x ray with Patchy right lung opacities concerning for pneumonia.    Assessment / Plan / Recommendation Clinical Impression  SLP attempted clinical swallow evaluation. Despite maximal cues (oral care, sternal rub) pt was unarousable for PO trials. Attempted a single ice chip however pt with poor PO readiness.  RN reports pt with waxing and waning mentation with baseline cough. PO has been held since pts decline in mentation. Recommend diet downgrade to puree consistencies, medicines crushed in puree when pt is fully alert. Hold PO until pt is alert. RN to page SLP if mentation improves this date for continued swallow assessement.  SLP Visit Diagnosis: Dysphagia, unspecified (R13.10)    Aspiration Risk  Risk for inadequate nutrition/hydration;Moderate aspiration risk    Diet Recommendation   Puree, thin liquids when pt is fully alert  Medication Administration: Crushed with puree     Other  Recommendations Oral Care Recommendations: Oral care BID   Follow up Recommendations Skilled Nursing facility      Frequency and Duration min 1 x/week  1 week       Prognosis Prognosis for Safe Diet Advancement: Fair Barriers to Reach Goals: Severity of deficits;Time post onset;Cognitive deficits      Swallow Study   General Date of Onset: 09/30/17 HPI: Brooke Savannahvelyn R Milleris a 83 y.o.femalewith known HTN, dementia, [facility] with main concern of ongoing fevers, exertional dyspnea, productive cough of clear sputum. Pt is in memory care unit and due to advanced dementia is not able to provide detailed information. Chest x ray with Patchy right lung opacities concerning for pneumonia.  Type of Study: Bedside Swallow Evaluation Previous Swallow Assessment: none on file  Diet Prior to this Study: Dysphagia 3 (soft);Thin liquids Temperature Spikes Noted: Yes Respiratory Status: Nasal cannula History of Recent Intubation: No Behavior/Cognition: Lethargic/Drowsy;Requires cueing;Doesn't follow directions Oral Cavity Assessment: Dry Oral Care Completed by SLP: Yes Oral Cavity - Dentition: Edentulous Vision: Impaired for self-feeding Self-Feeding Abilities: Total assist Patient Positioning: Upright in bed Baseline Vocal Quality: Low vocal intensity Volitional Cough: Cognitively unable to elicit Volitional Swallow: Unable to elicit    Oral/Motor/Sensory Function     Ice Chips Ice chips: Impaired Presentation: Spoon Oral Phase Impairments: Poor awareness of bolus;Other (comment)(poor oral readiness poor oral acceptance)   Thin Liquid Thin Liquid: Not tested    Nectar Thick Nectar Thick Liquid: Not tested   Honey Thick Honey Thick Liquid: Not tested   Puree Puree: Not tested  Solid   GO   Solid: Not tested       Brooke Duoshelsea Sumney MA, CCC-SLP Acute Care Speech Language Pathologist    Brooke Phelps 10/04/2017,11:37  AM

## 2017-10-04 NOTE — Progress Notes (Signed)
Patient went into A fib RVR, confirmed by EKG. Unable to get in touch with MD at this time. Called Elink nurse and she recommended that since BP is tolerating well  (157/71), that night shift could get in touch with MD on call.   Will continue to monitor.   Burna SisMichell G Zavaleta Catalan, RN

## 2017-10-05 ENCOUNTER — Inpatient Hospital Stay (HOSPITAL_COMMUNITY): Payer: Medicare Other

## 2017-10-05 DIAGNOSIS — J9601 Acute respiratory failure with hypoxia: Secondary | ICD-10-CM

## 2017-10-05 DIAGNOSIS — I4892 Unspecified atrial flutter: Secondary | ICD-10-CM

## 2017-10-05 LAB — CBC
HCT: 39.7 % (ref 36.0–46.0)
Hemoglobin: 12.6 g/dL (ref 12.0–15.0)
MCH: 29.6 pg (ref 26.0–34.0)
MCHC: 31.7 g/dL (ref 30.0–36.0)
MCV: 93.4 fL (ref 78.0–100.0)
Platelets: 339 10*3/uL (ref 150–400)
RBC: 4.25 MIL/uL (ref 3.87–5.11)
RDW: 14.1 % (ref 11.5–15.5)
WBC: 6.5 10*3/uL (ref 4.0–10.5)

## 2017-10-05 LAB — CULTURE, BLOOD (ROUTINE X 2)
Culture: NO GROWTH
Culture: NO GROWTH
SPECIAL REQUESTS: ADEQUATE

## 2017-10-05 LAB — BASIC METABOLIC PANEL
Anion gap: 8 (ref 5–15)
CHLORIDE: 103 mmol/L (ref 101–111)
CO2: 28 mmol/L (ref 22–32)
Calcium: 8.4 mg/dL — ABNORMAL LOW (ref 8.9–10.3)
Creatinine, Ser: 0.57 mg/dL (ref 0.44–1.00)
GFR calc Af Amer: >60 mL/min (ref >60)
GFR calc non Af Amer: >60 mL/min (ref >60)
Glucose, Bld: 127 mg/dL — ABNORMAL HIGH (ref 65–99)
POTASSIUM: 3.2 mmol/L — AB (ref 3.5–5.1)
Sodium: 139 mmol/L (ref 135–145)

## 2017-10-05 MED ORDER — POTASSIUM CHLORIDE CRYS ER 20 MEQ PO TBCR
40.0000 meq | EXTENDED_RELEASE_TABLET | Freq: Two times a day (BID) | ORAL | Status: AC
  Administered 2017-10-05: 40 meq via ORAL
  Filled 2017-10-05 (×2): qty 2

## 2017-10-05 MED ORDER — METOPROLOL TARTRATE 5 MG/5ML IV SOLN
2.5000 mg | Freq: Once | INTRAVENOUS | Status: AC
  Administered 2017-10-05: 2.5 mg via INTRAVENOUS

## 2017-10-05 NOTE — Progress Notes (Signed)
  Speech Language Pathology Treatment: Dysphagia  Patient Details Name: Brooke Phelps R Hallquist MRN: [id number] DOB: 1927/11/10 Today's Date: 10/05/2017 Time: [id number] SLP Time Calculation (min) (ACUTE ONLY): 22 min  Assessment / Plan / Recommendation Clinical Impression  Although pt is more alert today - definitive signs of gross aspiration, dysphagia observed with intake.  Pt provided with thin water - producing immediate cough x3/7 boluses, orange juice (3 ounces) and pudding (3 ounces).  Delayed oral transiting with lingual thrust apparent.  No immediate symptoms of aspiration with pudding/orange juice, however delayed copious ongoing cough continued for approximately 12 minutes post intake.  SLP spoke to charge nurse who reports hearing pt cough this am during her breakfast.  SLP certain pt is aspirating and modification of diet will likely note prevent this.  Of note, pt has a MOST form from April 2018 indicating NO feeding tube.  If pt is to consume po it should be with known aspiration and may not provide pt with much comfort given level of ongoing cough.    Recommend consider palliative consult to establish goals of care given progression of pna and aspiration.  RN informed.  Pending plan - rec NPO x medicine with puree and single ice chips/tsps of water for comfort and oral hygiene.     HPI HPI: Brooke Savannahvelyn R Milleris a 83 y.o.femalewith known HTN, dementia, [facility] with main concern of ongoing fevers, exertional dyspnea, productive cough of clear sputum. Pt is in memory care unit and due to advanced dementia is not able to provide detailed information. Chest x ray with Patchy right lung opacities concerning for pneumonia.       SLP Plan  Other (Comment)       Recommendations  Diet recommendations: NPO(x ice chips,tsps water and medicine with puree) Medication Administration: Crushed with puree Compensations: Slow rate;Small sips/bites Postural  Changes and/or Swallow Maneuvers: Seated upright 90 degrees;Upright 30-60 min after meal                Oral Care Recommendations: Oral care before and after PO Follow up Recommendations: Skilled Nursing facility SLP Visit Diagnosis: Dysphagia, oropharyngeal phase (R13.12) Plan: Other (Comment)       GO               Donavan Burnetamara Seleny Allbright, MS [facility] SLP [phone]  Chales AbrahamsKimball, Aquan Kope Ann 10/05/2017, 1:01 PM

## 2017-10-05 NOTE — Progress Notes (Signed)
PROGRESS NOTE    Brooke Ferrarivelyn R Gherardi  ZOX:[id number] DOB: 11-02-1928 DOA: 09/30/2017 PCP: [name], [name], [name]    Brief Narrative:  Brooke Phelps is a 83 y.o. female with known HTN, dementia, [facility] EMS [facility] with main concern of ongoing fevers, exertional dyspnea, productive cough of clear sputum. She was admitted for health care associated pneumonia.     Assessment & Plan:   Principal Problem:   HCAP (healthcare-associated pneumonia) Active Problems:   Hypoxia   Dementia with behavioral disturbance   ILD (interstitial lung disease) (HCC)   Atrial flutter (HCC)   Acute respiratory failure with hypoxia (HCC)   Acute respiratory failure with hypoxia secondary to right lower lobe pneumonia/healthcare associated pneumonia Started on vancomycin and cefepime later on vancomycin discontinued.  Today's day 5 of cefepime.  Respiratory culture showed Moraxella.  Urine for streptococcal pneumonia antigen and Legionella are negative. Blood cultures have been negative so far. Respiratory virus panel positive for rhinovirus. Since patient continues to have fevers, cough and requiring about 6 L of nasal cannula oxygen, we will repeat chest x-ray today. Continue cefepime. Continue with duo nebs, respiratory consult for pulmonary hygiene.  Hypertension Appears to be well controlled.  Atrial fibrillation with RVR Rate seen between 120s-140s.  Started the patient on p.o. Cardizem 180 mg daily and p.o. metoprolol.  If the rate does not get better we will start the patient on Cardizem drip.   Functional quadriplegia patient is bedbound and wheelchair-bound.   Abnormal UA Urine cultures show multiple bacteria. Patient currently asymptomatic Patient received about 5 days of antibiotics.   Hypokalemia replaced      DVT prophylaxis: Heparin subcu Code Status: DNR Family Communication: None at bedside Disposition Plan: Pending PT evaluation, probably  back to SNF when medically stable.  Consultants:   Speech therapy   Procedures: None   Antimicrobials:cefepime since admission   Subjective: No new complaints, no chest pain no shortness of breath, patient reports she is feeling better.  She continues to cough. Objective: Vitals:   10/05/17 0522 10/05/17 0610 10/05/17 0700 10/05/17 0800  BP: (!) 170/86  (!) 155/58 132/70  Pulse:   (!) 132 (!) 136  Resp:   (!) 26 (!) 23  Temp:    97.9 F (36.6 C)  TempSrc:    Oral  SpO2:  (!) 89% 91% 94%  Weight:      Height:        Intake/Output Summary (Last 24 hours) at 10/05/2017 0925 Last data filed at 10/05/2017 0800 Gross per 24 hour  Intake 3965 ml  Output 900 ml  Net 3065 ml   Filed Weights   10/01/17 1029 10/03/17 0425  Weight: 76.9 kg (169 lb 8.5 oz) 80.8 kg (178 lb 2.1 oz)    Examination:  General exam: Appears calm and comfortable on 6 lit of [place] oxygen.  Respiratory system: basilar rales,  diminished air entry at bases, rhonchi on the right side Cardiovascular system: S1 & S2 heard, tachycardic, irregular no JVD,  No pedal edema. Gastrointestinal system: Abdomen is nondistended, soft and nontender. No organomegaly or masses felt. Normal bowel sounds heard. Central nervous system: Alert, confused, nonfocal Extremities: No pedal edema cyanosis or clubbing Skin: No rashes, lesions or ulcers Psychiatry: . Mood & affect appropriate.     Data Reviewed: I have personally reviewed following labs and imaging studies  CBC: Recent Labs  Lab 09/30/17 1400 10/01/17 0556 10/02/17 0646 10/04/17 0318 10/05/17 0300  WBC 8.4 8.1  10.3 7.7 6.5  NEUTROABS 5.3  --   --   --   --   HGB 13.3 11.6* 12.1 11.9* 12.6  HCT 41.3 37.1 38.6 37.2 39.7  MCV 94.7 96.6 94.4 93.2 93.4  PLT 255 236 246 303 339   Basic Metabolic Panel: Recent Labs  Lab 09/30/17 1400 10/01/17 0556 10/02/17 0646 10/04/17 0318 10/05/17 0300  NA 138 140 136 136 139  K 3.5 3.5 3.4* 3.1* 3.2*  CL 102 109  103 101 103  CO2 30 26 25 27 28   GLUCOSE 112* 103* 97 134* 127*  BUN 18 11 7  5* <5*  CREATININE 0.92 0.68 0.68 0.60 0.57  CALCIUM 8.7* 7.9* 8.1* 8.3* 8.4*   GFR: Estimated Creatinine Clearance: 50.9 mL/min (by C-G formula based on SCr of 0.57 mg/dL). Liver Function Tests: Recent Labs  Lab 09/30/17 1400  AST 16  ALT 9*  ALKPHOS 57  BILITOT 1.0  PROT 7.3  ALBUMIN 3.1*   No results for input(s): LIPASE, AMYLASE in the last 168 hours. No results for input(s): AMMONIA in the last 168 hours. Coagulation Profile: Recent Labs  Lab 09/30/17 1500  INR 1.00   Cardiac Enzymes: No results for input(s): CKTOTAL, CKMB, CKMBINDEX, TROPONINI in the last 168 hours. BNP (last 3 results) No results for input(s): PROBNP in the last 8760 hours. HbA1C: No results for input(s): HGBA1C in the last 72 hours. CBG: No results for input(s): GLUCAP in the last 168 hours. Lipid Profile: No results for input(s): CHOL, HDL, LDLCALC, TRIG, CHOLHDL, LDLDIRECT in the last 72 hours. Thyroid Function Tests: Recent Labs    10/03/17 0815  TSH 2.865  FREET4 0.90  T3FREE 1.8*   Anemia Panel: No results for input(s): VITAMINB12, FOLATE, FERRITIN, TIBC, IRON, RETICCTPCT in the last 72 hours. Sepsis Labs: Recent Labs  Lab 09/30/17 1508  LATICACIDVEN 0.79    Recent Results (from the past 240 hour(s))  Culture, blood (Routine x 2)     Status: None (Preliminary result)   Collection Time: 09/30/17  2:00 PM  Result Value Ref Range Status   Specimen Description BLOOD RIGHT FOREARM  Final   Special Requests   Final    BOTTLES DRAWN AEROBIC AND ANAEROBIC Blood Culture results may not be optimal due to an inadequate volume of blood received in culture bottles   Culture   Final    NO GROWTH 4 DAYS Performed at [facility], [address], [place], [place] [address]    Report Status PENDING  Incomplete  Culture, blood (Routine x 2)     Status: None (Preliminary result)   Collection Time: 09/30/17   2:05 PM  Result Value Ref Range Status   Specimen Description BLOOD LEFT ANTECUBITAL  Final   Special Requests   Final    BOTTLES DRAWN AEROBIC AND ANAEROBIC Blood Culture adequate volume   Culture   Final    NO GROWTH 4 DAYS Performed at [facility], [address] [place], [place] [address]    Report Status PENDING  Incomplete  Urine culture     Status: Abnormal   Collection Time: 09/30/17  5:30 PM  Result Value Ref Range Status   Specimen Description URINE, RANDOM  Final   Special Requests NONE  Final   Culture MULTIPLE SPECIES PRESENT, SUGGEST RECOLLECTION (A)  Final   Report Status 10/02/2017 FINAL  Final  Culture, sputum-assessment     Status: None   Collection Time: 09/30/17  7:05 PM  Result  Value Ref Range Status   Specimen Description SPUTUM  Final   Special Requests NONE  Final   Sputum evaluation THIS SPECIMEN IS ACCEPTABLE FOR SPUTUM CULTURE  Final   Report Status 09/30/2017 FINAL  Final  Culture, respiratory (NON-Expectorated)     Status: None   Collection Time: 09/30/17  7:05 PM  Result Value Ref Range Status   Specimen Description SPUTUM  Final   Special Requests NONE Reflexed from [id number]  Final   Gram Stain   Final    ABUNDANT WBC PRESENT, PREDOMINANTLY PMN FEW GRAM POSITIVE COCCI IN PAIRS    Culture   Final    RARE MORAXELLA CATARRHALIS(BRANHAMELLA) BETA LACTAMASE POSITIVE Performed at [facility], [address] [place], [place] [address]    Report Status 10/03/2017 FINAL  Final  Respiratory Panel by PCR     Status: Abnormal   Collection Time: 09/30/17  7:21 PM  Result Value Ref Range Status   Adenovirus NOT DETECTED NOT DETECTED Final   Coronavirus 229E NOT DETECTED NOT DETECTED Final   Coronavirus HKU1 NOT DETECTED NOT DETECTED Final   Coronavirus NL63 NOT DETECTED NOT DETECTED Final   Coronavirus OC43 NOT DETECTED NOT DETECTED Final   Metapneumovirus NOT DETECTED NOT DETECTED Final   Rhinovirus / Enterovirus DETECTED (A) NOT DETECTED  Final   Influenza A NOT DETECTED NOT DETECTED Final   Influenza B NOT DETECTED NOT DETECTED Final   Parainfluenza Virus 1 NOT DETECTED NOT DETECTED Final   Parainfluenza Virus 2 NOT DETECTED NOT DETECTED Final   Parainfluenza Virus 3 NOT DETECTED NOT DETECTED Final   Parainfluenza Virus 4 NOT DETECTED NOT DETECTED Final   Respiratory Syncytial Virus NOT DETECTED NOT DETECTED Final   Bordetella pertussis NOT DETECTED NOT DETECTED Final   Chlamydophila pneumoniae NOT DETECTED NOT DETECTED Final   Mycoplasma pneumoniae NOT DETECTED NOT DETECTED Final    Comment: Performed at [facility], [address] [place], [place] [address]  MRSA PCR Screening     Status: None   Collection Time: 09/30/17  7:36 PM  Result Value Ref Range Status   MRSA by PCR NEGATIVE NEGATIVE Final    Comment:        The GeneXpert MRSA Assay (FDA approved for NASAL specimens only), is one component of a comprehensive MRSA colonization surveillance program. It is not intended to diagnose MRSA infection nor to guide or monitor treatment for MRSA infections.          Radiology Studies: No results found.      Scheduled Meds: . aspirin  325 mg Oral Daily  . benzonatate  100 mg Oral TID  . diltiazem  180 mg Oral Daily  . docusate sodium  100 mg Oral Daily  . guaiFENesin-dextromethorphan  10 mL Oral Q6H  . heparin injection (subcutaneous)  5,000 Units Subcutaneous Q8H  . hydrALAZINE  50 mg Oral Q6H  . ipratropium  0.5 mg Nebulization TID  . levalbuterol  0.63 mg Nebulization TID  . mouth rinse  15 mL Mouth Rinse BID  . metoprolol tartrate  100 mg Oral BID  . pantoprazole  40 mg Oral Daily  . PARoxetine  10 mg Oral Daily  . polyethylene glycol  17 g Oral Q8H  . potassium chloride  40 mEq Oral BID   Continuous Infusions: . sodium chloride 75 mL/hr at 10/05/17 0313  . ceFEPime (MAXIPIME) IV 1 g (10/05/17 0925)     LOS: 5 days    Time spent:  35 minutes.    Kathlen ModyVijaya Mairin Lindsley, MD [facility] Pager [phone]  If 7PM-7AM, please contact night-coverage [email or web address] Password [email or web address] 10/05/2017, 9:25 AM

## 2017-10-05 NOTE — Evaluation (Signed)
Physical Therapy Evaluation Patient Details Name: Brooke Phelps MRN: [id number] DOB: June 18, 1928 Today's Date: Phelps   History of Present Illness  Brooke Phelps a 83 y.o.femalewith known HTN, dementia, [facility] via EMS [facility] with main concern of ongoing fevers, exertional dyspnea, productive cough of clear sputum. She was admitted for health care associated pneumonia.   Clinical Impression  The patient requires extensive assistance for bed mobility. Noted that patient is from SNF and will return. No furhter Skilled PT needs at this time . Recommend mechanical lift for transfers.    Follow Up Recommendations No PT follow up(return to LTC.)    Equipment Recommendations  None recommended by PT    Recommendations for Other Services       Precautions / Restrictions Precautions Precautions: Fall Precaution Comments: incontinence      Mobility  Bed Mobility Overal bed mobility: Needs Assistance Bed Mobility: Rolling Rolling: Max assist;+2 for physical assistance;+2 for safety/equipment         General bed mobility comments: assist to roll for bed linens to be changed.  Transfers                 General transfer comment: did not attempt today. Patient incontinent of urine, total care  prior  to admission  Ambulation/Gait                Stairs            Wheelchair Mobility    Modified Rankin (Stroke Patients Only)       Balance                                             Pertinent Vitals/Pain Pain Assessment: No/denies pain    Home Living Family/patient expects to be discharged to:: Skilled nursing facility                 Additional Comments[facility],     Prior Function           Comments: per notes WC and bed bound     Hand Dominance        Extremity/Trunk Assessment   Upper Extremity Assessment Upper Extremity Assessment:  Generalized weakness    Lower Extremity Assessment Lower Extremity Assessment: Generalized weakness       Communication   Communication: No difficulties  Cognition Arousal/Alertness: Awake/alert Behavior During Therapy: WFL for tasks assessed/performed Overall Cognitive Status: History of cognitive impairments - at baseline                                        General Comments      Exercises     Assessment/Plan    PT Assessment Patent does not need any further PT services  PT Problem List         PT Treatment Interventions      PT Goals (Current goals can be found in the Care Plan section)  Acute Rehab PT Goals PT Goal Formulation: All assessment and education complete, DC therapy    Frequency     Barriers to discharge        Co-evaluation               AM-PAC PT "6 Clicks" Daily  Activity  Outcome Measure Difficulty turning over in bed (including adjusting bedclothes, sheets and blankets)?: Unable Difficulty moving from lying on back to sitting on the side of the bed? : Unable Difficulty sitting down on and standing up from a chair with arms (e.g., wheelchair, bedside commode, etc,.)?: Unable Help needed moving to and from a bed to chair (including a wheelchair)?: Total Help needed walking in hospital room?: Total Help needed climbing 3-5 steps with a railing? : Total 6 Click Score: 6    End of Session   Activity Tolerance: Patient tolerated treatment well Patient left: in bed;with call bell/phone within reach Nurse Communication: Mobility status;Need for lift equipment PT Visit Diagnosis: Adult, failure to thrive (R62.7)    Time: [id number] PT Time Calculation (min) (ACUTE ONLY): 17 min   Charges:   PT Evaluation $PT Eval Low Complexity: 1 Low     PT G CodesBlanchard Kelch:        Laurana Magistro PT [phone]   [name], Jakie Debow Brooke Phelps, 5:37 PM

## 2017-10-05 NOTE — Progress Notes (Signed)
Medications that could be crushed, were crushed and given with puree for administration. Pt would not swallow and kept spitting out tessalon capsule when served to her in apple sauce. Pt refused to swallow potassium pills, even when dissolved. Nelly LaurenceAshley, S., RN notified.

## 2017-10-06 ENCOUNTER — Inpatient Hospital Stay (HOSPITAL_COMMUNITY): Payer: Medicare Other

## 2017-10-06 LAB — CBC
HCT: 39 % (ref 36.0–46.0)
Hemoglobin: 12.5 g/dL (ref 12.0–15.0)
MCH: 29.8 pg (ref 26.0–34.0)
MCHC: 32.1 g/dL (ref 30.0–36.0)
MCV: 93.1 fL (ref 78.0–100.0)
PLATELETS: 364 10*3/uL (ref 150–400)
RBC: 4.19 MIL/uL (ref 3.87–5.11)
RDW: 14.3 % (ref 11.5–15.5)
WBC: 7.6 10*3/uL (ref 4.0–10.5)

## 2017-10-06 LAB — BASIC METABOLIC PANEL
ANION GAP: 7 (ref 5–15)
BUN: <5 mg/dL — ABNORMAL LOW (ref 6–20)
CALCIUM: 8.2 mg/dL — AB (ref 8.9–10.3)
CO2: 28 mmol/L (ref 22–32)
Chloride: 100 mmol/L — ABNORMAL LOW (ref 101–111)
Creatinine, Ser: 0.54 mg/dL (ref 0.44–1.00)
GLUCOSE: 127 mg/dL — AB (ref 65–99)
POTASSIUM: 3.1 mmol/L — AB (ref 3.5–5.1)
SODIUM: 135 mmol/L (ref 135–145)

## 2017-10-06 MED ORDER — PANTOPRAZOLE SODIUM 40 MG PO PACK
40.0000 mg | PACK | Freq: Every day | ORAL | Status: DC
  Administered 2017-10-06 – 2017-10-07 (×2): 40 mg via ORAL
  Filled 2017-10-06 (×3): qty 20

## 2017-10-06 MED ORDER — SODIUM CHLORIDE 0.9 % IV SOLN
3.0000 g | Freq: Four times a day (QID) | INTRAVENOUS | Status: AC
  Administered 2017-10-06 – 2017-10-08 (×11): 3 g via INTRAVENOUS
  Filled 2017-10-06 (×11): qty 3

## 2017-10-06 MED ORDER — DILTIAZEM HCL 60 MG PO TABS
60.0000 mg | ORAL_TABLET | Freq: Three times a day (TID) | ORAL | Status: DC
  Administered 2017-10-06 – 2017-10-09 (×9): 60 mg via ORAL
  Filled 2017-10-06 (×10): qty 1

## 2017-10-06 MED ORDER — POTASSIUM CHLORIDE CRYS ER 20 MEQ PO TBCR
40.0000 meq | EXTENDED_RELEASE_TABLET | Freq: Two times a day (BID) | ORAL | Status: AC
  Administered 2017-10-06: 40 meq via ORAL
  Filled 2017-10-06 (×2): qty 2

## 2017-10-06 MED ORDER — DOCUSATE SODIUM 50 MG/5ML PO LIQD
100.0000 mg | Freq: Every day | ORAL | Status: DC
  Filled 2017-10-06 (×2): qty 10

## 2017-10-06 NOTE — Plan of Care (Signed)
  Progressing Clinical Measurements: Ability to maintain clinical measurements within normal limits will improve 10/06/2017 1757 - Progressing by Burna SisZavaleta Catalan, Koal Eslinger G, RN Will remain free from infection 10/06/2017 1757 - Progressing by Burna SisZavaleta Catalan, Ayslin Kundert G, RN Diagnostic test results will improve 10/06/2017 1757 - Progressing by Burna SisZavaleta Catalan, Eliane Hammersmith G, RN Cardiovascular complication will be avoided 10/06/2017 1757 - Progressing by Burna SisZavaleta Catalan, Abeer Iversen G, RN Activity: Risk for activity intolerance will decrease 10/06/2017 1757 - Progressing by Burna SisZavaleta Catalan, Dakiya Puopolo G, RN Coping: Level of anxiety will decrease 10/06/2017 1757 - Progressing by Burna SisZavaleta Catalan, Gresia Isidoro G, RN Elimination: Will not experience complications related to bowel motility 10/06/2017 1757 - Progressing by Burna SisZavaleta Catalan, Sarye Kath G, RN Will not experience complications related to urinary retention 10/06/2017 1757 - Progressing by Burna SisZavaleta Catalan, Kyaire Gruenewald G, RN Pain Managment: General experience of comfort will improve 10/06/2017 1757 - Progressing by Burna SisZavaleta Catalan, Raeleigh Guinn G, RN Safety: Ability to remain free from injury will improve 10/06/2017 1757 - Progressing by Burna SisZavaleta Catalan, Roland Prine G, RN Skin Integrity: Risk for impaired skin integrity will decrease 10/06/2017 1757 - Progressing by Burna SisZavaleta Catalan, Aliha Diedrich G, RN   Not Progressing Health Behavior/Discharge Planning: Ability to manage health-related needs will improve 10/06/2017 1757 - Not Progressing by Burna SisZavaleta Catalan, Nobuo Nunziata G, RN Clinical Measurements: Respiratory complications will improve 10/06/2017 1757 - Not Progressing by Burna SisZavaleta Catalan, Pao Haffey G, RN PNA not improving Nutrition: Adequate nutrition will be maintained 10/06/2017 1757 - Not Progressing by Burna SisZavaleta Catalan, Shyhiem Beeney G, RN Patient not eating dt aspiration risk

## 2017-10-06 NOTE — Progress Notes (Signed)
PROGRESS NOTE    Brooke Ferrarivelyn R Lanyon  [id number] DOB: 1927-12-21 DOA: 09/30/2017 PCP: [name], [name], [name]    Brief Narrative:  Brooke Phelps is a 83 y.o. female with known HTN, dementia, [facility] via EMS [facility] with main concern of ongoing fevers, exertional dyspnea, productive cough of clear sputum. She was admitted for health care associated pneumonia.     Assessment & Plan:   Principal Problem:   HCAP (healthcare-associated pneumonia) Active Problems:   Hypoxia   Dementia with behavioral disturbance   ILD (interstitial lung disease) (HCC)   Atrial flutter (HCC)   Acute respiratory failure with hypoxia (HCC)   Acute respiratory failure with hypoxia secondary to right lower lobe pneumonia/healthcare associated pneumonia Started on vancomycin and cefepime later on vancomycin discontinued.  Completed 5 days of cefepime and changed to unasyn.  Respiratory culture showed Moraxella.  Urine for streptococcal pneumonia antigen and Legionella are negative. Blood cultures have been negative so far. Respiratory virus panel positive for rhinovirus. Since patient continues to have fevers, cough and requiring about 6 L of nasal cannula oxygen, we will repeat chest x-ray, showed worsening of the pneumonia, possibly aspiration. Repeat SLP eval and recommending NPO except for meds.  Continue with duo nebs, respiratory consult for pulmonary hygiene.  Hypertension Appears to be well controlled.  Atrial fibrillation with RVR Rate better with cardizem.    Functional quadriplegia patient is bedbound and wheelchair-bound. No changes.    Abnormal UA Urine cultures show multiple bacteria. Patient currently asymptomatic Patient received about 5 days of antibiotics.   Hypokalemia replaced, repeat in am.    In view of the multiple medical problems and clinically not improving, with  Worsening aspiration pneumonia, a CT head without contrast ordered to evaluate  for stroke and requested palliative care consult for goals of care discussion.       DVT prophylaxis: Heparin subcu Code Status: DNR Family Communication: None at bedside, discussed with family on 12/4  Disposition Plan: Pending PT evaluation, probably back to SNF when medically stable anda fter palliative care discussion.   Consultants:   Speech therapy   Procedures: None   Antimicrobials:cefepime since admission   Subjective: No new complaints, no chest pain or sob.  Objective: Vitals:   10/06/17 1300 10/06/17 1319 10/06/17 1400 10/06/17 1420  BP: (!) 163/65  (!) 172/66 (!) 172/66  Pulse: (!) 58 64 64   Resp: 20 16 (!) 23   Temp:      TempSrc:      SpO2: (!) 88% 91% 93%   Weight:      Height:        Intake/Output Summary (Last 24 hours) at 10/06/2017 1459 Last data filed at 10/06/2017 1125 Gross per 24 hour  Intake 900 ml  Output 800 ml  Net 100 ml   Filed Weights   10/01/17 1029 10/03/17 0425  Weight: 76.9 kg (169 lb 8.5 oz) 80.8 kg (178 lb 2.1 oz)    Examination:  General exam: Appears calm and comfortable on 6 lit of [place] oxygen.  Respiratory system: rhonchi on the right, no wheezing , air entry fair.  Cardiovascular system: S1 & S2 heard, rate okay,  irregular no JVD,  No pedal edema. Gastrointestinal system: Abdomenis soft non tender non distended bowel sounds good.  Central nervous system: Alert, confused, nonfocal Extremities: No pedal edema cyanosis or clubbing Skin: No rashes, lesions or ulcers Psychiatry: . Mood & affect appropriate.     Data Reviewed: I have  personally reviewed following labs and imaging studies  CBC: Recent Labs  Lab 09/30/17 1400 10/01/17 0556 10/02/17 0646 10/04/17 0318 10/05/17 0300 10/06/17 0311  WBC 8.4 8.1 10.3 7.7 6.5 7.6  NEUTROABS 5.3  --   --   --   --   --   HGB 13.3 11.6* 12.1 11.9* 12.6 12.5  HCT 41.3 37.1 38.6 37.2 39.7 39.0  MCV 94.7 96.6 94.4 93.2 93.4 93.1  PLT 255 236 246 303 339 364   Basic  Metabolic Panel: Recent Labs  Lab 10/01/17 0556 10/02/17 0646 10/04/17 0318 10/05/17 0300 10/06/17 0311  NA 140 136 136 139 135  K 3.5 3.4* 3.1* 3.2* 3.1*  CL 109 103 101 103 100*  CO2 26 25 27 28 28   GLUCOSE 103* 97 134* 127* 127*  BUN 11 7 5* <5* <5*  CREATININE 0.68 0.68 0.60 0.57 0.54  CALCIUM 7.9* 8.1* 8.3* 8.4* 8.2*   GFR: Estimated Creatinine Clearance: 50.9 mL/min (by C-G formula based on SCr of 0.54 mg/dL). Liver Function Tests: Recent Labs  Lab 09/30/17 1400  AST 16  ALT 9*  ALKPHOS 57  BILITOT 1.0  PROT 7.3  ALBUMIN 3.1*   No results for input(s): LIPASE, AMYLASE in the last 168 hours. No results for input(s): AMMONIA in the last 168 hours. Coagulation Profile: Recent Labs  Lab 09/30/17 1500  INR 1.00   Cardiac Enzymes: No results for input(s): CKTOTAL, CKMB, CKMBINDEX, TROPONINI in the last 168 hours. BNP (last 3 results) No results for input(s): PROBNP in the last 8760 hours. HbA1C: No results for input(s): HGBA1C in the last 72 hours. CBG: No results for input(s): GLUCAP in the last 168 hours. Lipid Profile: No results for input(s): CHOL, HDL, LDLCALC, TRIG, CHOLHDL, LDLDIRECT in the last 72 hours. Thyroid Function Tests: No results for input(s): TSH, T4TOTAL, FREET4, T3FREE, THYROIDAB in the last 72 hours. Anemia Panel: No results for input(s): VITAMINB12, FOLATE, FERRITIN, TIBC, IRON, RETICCTPCT in the last 72 hours. Sepsis Labs: Recent Labs  Lab 09/30/17 1508  LATICACIDVEN 0.79    Recent Results (from the past 240 hour(s))  Culture, blood (Routine x 2)     Status: None   Collection Time: 09/30/17  2:00 PM  Result Value Ref Range Status   Specimen Description BLOOD RIGHT FOREARM  Final   Special Requests   Final    BOTTLES DRAWN AEROBIC AND ANAEROBIC Blood Culture results may not be optimal due to an inadequate volume of blood received in culture bottles   Culture   Final    NO GROWTH 5 DAYS Performed at [facility], [address] [place], [place] [address]    Report Status 10/05/2017 FINAL  Final  Culture, blood (Routine x 2)     Status: None   Collection Time: 09/30/17  2:05 PM  Result Value Ref Range Status   Specimen Description BLOOD LEFT ANTECUBITAL  Final   Special Requests   Final    BOTTLES DRAWN AEROBIC AND ANAEROBIC Blood Culture adequate volume   Culture   Final    NO GROWTH 5 DAYS Performed at [facility], [address] [place], [place] [address]    Report Status 10/05/2017 FINAL  Final  Urine culture     Status: Abnormal   Collection Time: 09/30/17  5:30 PM  Result Value Ref Range Status   Specimen Description URINE, RANDOM  Final   Special Requests NONE  Final   Culture MULTIPLE SPECIES PRESENT, SUGGEST  RECOLLECTION (A)  Final   Report Status 10/02/2017 FINAL  Final  Culture, sputum-assessment     Status: None   Collection Time: 09/30/17  7:05 PM  Result Value Ref Range Status   Specimen Description SPUTUM  Final   Special Requests NONE  Final   Sputum evaluation THIS SPECIMEN IS ACCEPTABLE FOR SPUTUM CULTURE  Final   Report Status 09/30/2017 FINAL  Final  Culture, respiratory (NON-Expectorated)     Status: None   Collection Time: 09/30/17  7:05 PM  Result Value Ref Range Status   Specimen Description SPUTUM  Final   Special Requests NONE Reflexed from [id number]  Final   Gram Stain   Final    ABUNDANT WBC PRESENT, PREDOMINANTLY PMN FEW GRAM POSITIVE COCCI IN PAIRS    Culture   Final    RARE MORAXELLA CATARRHALIS(BRANHAMELLA) BETA LACTAMASE POSITIVE Performed at [facility], [address] [place], [place] [address]    Report Status 10/03/2017 FINAL  Final  Respiratory Panel by PCR     Status: Abnormal   Collection Time: 09/30/17  7:21 PM  Result Value Ref Range Status   Adenovirus NOT DETECTED NOT DETECTED Final   Coronavirus 229E NOT DETECTED NOT DETECTED Final   Coronavirus HKU1 NOT DETECTED NOT DETECTED Final   Coronavirus NL63 NOT DETECTED NOT DETECTED  Final   Coronavirus OC43 NOT DETECTED NOT DETECTED Final   Metapneumovirus NOT DETECTED NOT DETECTED Final   Rhinovirus / Enterovirus DETECTED (A) NOT DETECTED Final   Influenza A NOT DETECTED NOT DETECTED Final   Influenza B NOT DETECTED NOT DETECTED Final   Parainfluenza Virus 1 NOT DETECTED NOT DETECTED Final   Parainfluenza Virus 2 NOT DETECTED NOT DETECTED Final   Parainfluenza Virus 3 NOT DETECTED NOT DETECTED Final   Parainfluenza Virus 4 NOT DETECTED NOT DETECTED Final   Respiratory Syncytial Virus NOT DETECTED NOT DETECTED Final   Bordetella pertussis NOT DETECTED NOT DETECTED Final   Chlamydophila pneumoniae NOT DETECTED NOT DETECTED Final   Mycoplasma pneumoniae NOT DETECTED NOT DETECTED Final    Comment: Performed at [facility], [address] [place], [place] [address]  MRSA PCR Screening     Status: None   Collection Time: 09/30/17  7:36 PM  Result Value Ref Range Status   MRSA by PCR NEGATIVE NEGATIVE Final    Comment:        The GeneXpert MRSA Assay (FDA approved for NASAL specimens only), is one component of a comprehensive MRSA colonization surveillance program. It is not intended to diagnose MRSA infection nor to guide or monitor treatment for MRSA infections.          Radiology Studies: Dg Chest Port 1 View  Result Date: 10/05/2017 CLINICAL DATA:  Fever over the past several days with weakness and lethargy. History of COPD. EXAM: PORTABLE CHEST 1 VIEW COMPARISON:  PA and lateral chest 09/30/2017. Single-view of the chest 05/15/2016. FINDINGS: Airspace disease in the right mid and lower lung zones has worsened since the prior examination. There is also new airspace disease in the medial aspect of the left lung base. No pneumothorax. There is likely a small right pleural effusion. Heart size is upper normal. IMPRESSION: Bibasilar airspace disease is worse on the right and has progressed since the most recent examination most consistent with worsened  pneumonia. Electronically Signed   By: Drusilla Kanner M.D.   On: 10/05/2017 10:08        Scheduled Meds: . aspirin  325  mg Oral Daily  . benzonatate  100 mg Oral TID  . diltiazem  60 mg Oral Q8H  . docusate  100 mg Oral Daily  . guaiFENesin-dextromethorphan  10 mL Oral Q6H  . heparin injection (subcutaneous)  5,000 Units Subcutaneous Q8H  . hydrALAZINE  50 mg Oral Q6H  . ipratropium  0.5 mg Nebulization TID  . levalbuterol  0.63 mg Nebulization TID  . mouth rinse  15 mL Mouth Rinse BID  . metoprolol tartrate  100 mg Oral BID  . pantoprazole sodium  40 mg Oral Daily  . PARoxetine  10 mg Oral Daily  . polyethylene glycol  17 g Oral Q8H  . potassium chloride  40 mEq Oral BID   Continuous Infusions: . sodium chloride 75 mL/hr at 10/06/17 0626  . ampicillin-sulbactam (UNASYN) IV Stopped (10/06/17 1125)     LOS: 6 days    Time spent: 35 minutes.    Kathlen Mody, MD [facility] Pager [phone]  If 7PM-7AM, please contact night-coverage [email or web address] Password [email or web address] 10/06/2017, 2:59 PM

## 2017-10-07 DIAGNOSIS — J189 Pneumonia, unspecified organism: Secondary | ICD-10-CM

## 2017-10-07 DIAGNOSIS — Z515 Encounter for palliative care: Secondary | ICD-10-CM

## 2017-10-07 DIAGNOSIS — Z7189 Other specified counseling: Secondary | ICD-10-CM

## 2017-10-07 DIAGNOSIS — F0391 Unspecified dementia with behavioral disturbance: Secondary | ICD-10-CM

## 2017-10-07 LAB — CBC
HCT: 39.2 % (ref 36.0–46.0)
Hemoglobin: 12.7 g/dL (ref 12.0–15.0)
MCH: 30.1 pg (ref 26.0–34.0)
MCHC: 32.4 g/dL (ref 30.0–36.0)
MCV: 92.9 fL (ref 78.0–100.0)
PLATELETS: 400 10*3/uL (ref 150–400)
RBC: 4.22 MIL/uL (ref 3.87–5.11)
RDW: 14.4 % (ref 11.5–15.5)
WBC: 8.5 10*3/uL (ref 4.0–10.5)

## 2017-10-07 LAB — BASIC METABOLIC PANEL
ANION GAP: 9 (ref 5–15)
CALCIUM: 8.1 mg/dL — AB (ref 8.9–10.3)
CO2: 30 mmol/L (ref 22–32)
CREATININE: 0.54 mg/dL (ref 0.44–1.00)
Chloride: 98 mmol/L — ABNORMAL LOW (ref 101–111)
GFR calc Af Amer: >60 mL/min (ref >60)
GFR calc non Af Amer: >60 mL/min (ref >60)
GLUCOSE: 115 mg/dL — AB (ref 65–99)
Potassium: 3.1 mmol/L — ABNORMAL LOW (ref 3.5–5.1)
Sodium: 137 mmol/L (ref 135–145)

## 2017-10-07 MED ORDER — POTASSIUM CHLORIDE 10 MEQ/100ML IV SOLN
10.0000 meq | INTRAVENOUS | Status: AC
  Administered 2017-10-07 (×2): 10 meq via INTRAVENOUS
  Filled 2017-10-07 (×2): qty 100

## 2017-10-07 MED ORDER — POTASSIUM CHLORIDE CRYS ER 20 MEQ PO TBCR
40.0000 meq | EXTENDED_RELEASE_TABLET | Freq: Two times a day (BID) | ORAL | Status: AC
  Administered 2017-10-07 (×2): 40 meq via ORAL
  Filled 2017-10-07 (×2): qty 2

## 2017-10-07 NOTE — Progress Notes (Signed)
PROGRESS NOTE    Brooke Phelps  ZOX:[id number] DOB: 15-Sep-1928 DOA: 09/30/2017 PCP: Elizabeth Palau, [name]    Brief Narrative:  Brooke Phelps is a 83 y.o. female with known HTN, dementia, presented via EMS from [facility] with main concern of ongoing fevers, exertional dyspnea, productive cough of clear sputum. She was admitted for health care associated pneumonia.  Despite 7 days of treatment patient continues to decline, require up to 4-5 L of nasal cannula oxygen to keep sats greater than 90%.  She failed swallow evaluation and currently n.p.o. except for meds.  Palliative care consult requested for goals of care meeting.  She received 6 days of IV cefepime followed by Unasyn.    Assessment & Plan:   Principal Problem:   HCAP (healthcare-associated pneumonia) Active Problems:   Hypoxia   Dementia with behavioral disturbance   ILD (interstitial lung disease) (HCC)   Atrial flutter (HCC)   Acute respiratory failure with hypoxia (HCC)   Acute respiratory failure with hypoxia secondary to right lower lobe pneumonia/healthcare associated pneumonia Started on vancomycin and cefepime later on vancomycin discontinued.  Completed 5 days of cefepime and changed to unasyn.  Respiratory culture showed Moraxella.  Urine for streptococcal pneumonia antigen and Legionella are negative. Blood cultures have been negative so far. Respiratory virus panel positive for rhinovirus. Since patient continues to have fevers, cough and requiring about 6 L of nasal cannula oxygen, we have repeated chest x-ray, showed worsening of the pneumonia, possibly aspiration. Repeat SLP eval and recommending NPO except for meds.  Continue with duo nebs, respiratory consult for pulmonary hygiene. Recommend to continue with IV Unasyn for now.  Last 24 hours we were able to wean her oxygen from 6 down to 4 L nasal cannula oxygen.  Hypertension Appears to be well controlled.  Atrial fibrillation with  RVR Rate better with cardizem.    Functional quadriplegia patient is bedbound and wheelchair-bound. No changes.    Abnormal UA Urine cultures show multiple bacteria. Patient currently asymptomatic Patient received about 5 days of antibiotics.   Hypokalemia replaced, repeat in am.    In view of the multiple medical problems and clinically not improving, with  Worsening aspiration pneumonia, a CT head without contrast ordered to evaluate for stroke and requested palliative care consult for goals of care discussion.       DVT prophylaxis: Heparin sub Code Status: DNR Family Communication: None at bedside, discussed with family on 12/4  Disposition Plan: Pending PT evaluation, probably back to SNF when medically stable and after palliative care discussion.   Consultants:   Speech therapy   Procedures: None   Antimicrobials:cefepime since admission   Subjective: ConfusED, appears comfortable.  Denies any nausea vomiting abdominal pain chest pain or shortness of breath. Objective: Vitals:   10/07/17 1000 10/07/17 1100 10/07/17 1135 10/07/17 1342  BP: (!) 135/46     Pulse: (!) 58 (!) 58  64  Resp: 19 20  13   Temp:   98.2 F (36.8 C)   TempSrc:   Oral   SpO2: 90% 92%  94%  Weight:      Height:        Intake/Output Summary (Last 24 hours) at 10/07/2017 1448 Last data filed at 10/07/2017 1400 Gross per 24 hour  Intake 3750 ml  Output 300 ml  Net 3450 ml   Filed Weights   10/01/17 1029 10/03/17 0425  Weight: 76.9 kg (169 lb 8.5 oz) 80.8 kg (178 lb 2.1 oz)  Examination:  General exam: Appears calm and comfortable on 4 lit of [place] oxygen.  Respiratory system: No wheezing, diminished air entry at bases. Cardiovascular system: S1 & S2 heard, irregular, no JVD,  No pedal edema. Gastrointestinal system: Abdomen soft, nontender nondistended, bowel sounds are good Central nervous system: Alert, confused, nonfocal Extremities: No pedal edema cyanosis or  clubbing Skin: No rashes, lesions or ulcers Psychiatry: . Mood & affect appropriate.      Data Reviewed: I have personally reviewed following labs and imaging studies  CBC: Recent Labs  Lab 10/02/17 0646 10/04/17 0318 10/05/17 0300 10/06/17 0311 10/07/17 0254  WBC 10.3 7.7 6.5 7.6 8.5  HGB 12.1 11.9* 12.6 12.5 12.7  HCT 38.6 37.2 39.7 39.0 39.2  MCV 94.4 93.2 93.4 93.1 92.9  PLT 246 303 339 364 400   Basic Metabolic Panel: Recent Labs  Lab 10/02/17 0646 10/04/17 0318 10/05/17 0300 10/06/17 0311 10/07/17 0254  NA 136 136 139 135 137  K 3.4* 3.1* 3.2* 3.1* 3.1*  CL 103 101 103 100* 98*  CO2 25 27 28 28 30   GLUCOSE 97 134* 127* 127* 115*  BUN 7 5* <5* <5* <5*  CREATININE 0.68 0.60 0.57 0.54 0.54  CALCIUM 8.1* 8.3* 8.4* 8.2* 8.1*   GFR: Estimated Creatinine Clearance: 50.9 mL/min (by C-G formula based on SCr of 0.54 mg/dL). Liver Function Tests: No results for input(s): AST, ALT, ALKPHOS, BILITOT, PROT, ALBUMIN in the last 168 hours. No results for input(s): LIPASE, AMYLASE in the last 168 hours. No results for input(s): AMMONIA in the last 168 hours. Coagulation Profile: Recent Labs  Lab 09/30/17 1500  INR 1.00   Cardiac Enzymes: No results for input(s): CKTOTAL, CKMB, CKMBINDEX, TROPONINI in the last 168 hours. BNP (last 3 results) No results for input(s): PROBNP in the last 8760 hours. HbA1C: No results for input(s): HGBA1C in the last 72 hours. CBG: No results for input(s): GLUCAP in the last 168 hours. Lipid Profile: No results for input(s): CHOL, HDL, LDLCALC, TRIG, CHOLHDL, LDLDIRECT in the last 72 hours. Thyroid Function Tests: No results for input(s): TSH, T4TOTAL, FREET4, T3FREE, THYROIDAB in the last 72 hours. Anemia Panel: No results for input(s): VITAMINB12, FOLATE, FERRITIN, TIBC, IRON, RETICCTPCT in the last 72 hours. Sepsis Labs: Recent Labs  Lab 09/30/17 1508  LATICACIDVEN 0.79    Recent Results (from the past 240 hour(s))   Culture, blood (Routine x 2)     Status: None   Collection Time: 09/30/17  2:00 PM  Result Value Ref Range Status   Specimen Description BLOOD RIGHT FOREARM  Final   Special Requests   Final    BOTTLES DRAWN AEROBIC AND ANAEROBIC Blood Culture results may not be optimal due to an inadequate volume of blood received in culture bottles   Culture   Final    NO GROWTH 5 DAYS Performed at [facility], [address] [place], [place] [address]    Report Status 10/05/2017 FINAL  Final  Culture, blood (Routine x 2)     Status: None   Collection Time: 09/30/17  2:05 PM  Result Value Ref Range Status   Specimen Description BLOOD LEFT ANTECUBITAL  Final   Special Requests   Final    BOTTLES DRAWN AEROBIC AND ANAEROBIC Blood Culture adequate volume   Culture   Final    NO GROWTH 5 DAYS Performed at [facility], [address] [place], [place] [address]    Report Status 10/05/2017 FINAL  Final  Urine culture     Status: Abnormal   Collection Time: 09/30/17  5:30 PM  Result Value Ref Range Status   Specimen Description URINE, RANDOM  Final   Special Requests NONE  Final   Culture MULTIPLE SPECIES PRESENT, SUGGEST RECOLLECTION (A)  Final   Report Status 10/02/2017 FINAL  Final  Culture, sputum-assessment     Status: None   Collection Time: 09/30/17  7:05 PM  Result Value Ref Range Status   Specimen Description SPUTUM  Final   Special Requests NONE  Final   Sputum evaluation THIS SPECIMEN IS ACCEPTABLE FOR SPUTUM CULTURE  Final   Report Status 09/30/2017 FINAL  Final  Culture, respiratory (NON-Expectorated)     Status: None   Collection Time: 09/30/17  7:05 PM  Result Value Ref Range Status   Specimen Description SPUTUM  Final   Special Requests NONE Reflexed from [id number]  Final   Gram Stain   Final    ABUNDANT WBC PRESENT, PREDOMINANTLY PMN FEW GRAM POSITIVE COCCI IN PAIRS    Culture   Final    RARE MORAXELLA CATARRHALIS(BRANHAMELLA) BETA LACTAMASE POSITIVE Performed  at [facility], [address] [place], [place] [address]    Report Status 10/03/2017 FINAL  Final  Respiratory Panel by PCR     Status: Abnormal   Collection Time: 09/30/17  7:21 PM  Result Value Ref Range Status   Adenovirus NOT DETECTED NOT DETECTED Final   Coronavirus 229E NOT DETECTED NOT DETECTED Final   Coronavirus HKU1 NOT DETECTED NOT DETECTED Final   Coronavirus NL63 NOT DETECTED NOT DETECTED Final   Coronavirus OC43 NOT DETECTED NOT DETECTED Final   Metapneumovirus NOT DETECTED NOT DETECTED Final   Rhinovirus / Enterovirus DETECTED (A) NOT DETECTED Final   Influenza A NOT DETECTED NOT DETECTED Final   Influenza B NOT DETECTED NOT DETECTED Final   Parainfluenza Virus 1 NOT DETECTED NOT DETECTED Final   Parainfluenza Virus 2 NOT DETECTED NOT DETECTED Final   Parainfluenza Virus 3 NOT DETECTED NOT DETECTED Final   Parainfluenza Virus 4 NOT DETECTED NOT DETECTED Final   Respiratory Syncytial Virus NOT DETECTED NOT DETECTED Final   Bordetella pertussis NOT DETECTED NOT DETECTED Final   Chlamydophila pneumoniae NOT DETECTED NOT DETECTED Final   Mycoplasma pneumoniae NOT DETECTED NOT DETECTED Final    Comment: Performed at [facility], [address], [place], [place] [address]  MRSA PCR Screening     Status: None   Collection Time: 09/30/17  7:36 PM  Result Value Ref Range Status   MRSA by PCR NEGATIVE NEGATIVE Final    Comment:        The GeneXpert MRSA Assay (FDA approved for NASAL specimens only), is one component of a comprehensive MRSA colonization surveillance program. It is not intended to diagnose MRSA infection nor to guide or monitor treatment for MRSA infections.          Radiology Studies: Ct Head Wo Contrast  Result Date: 10/06/2017 CLINICAL DATA:  Altered level of consciousness. Hypertension and dementia. Fever. EXAM: CT HEAD WITHOUT CONTRAST TECHNIQUE: Contiguous axial images were obtained from the base of the skull through the vertex  without intravenous contrast. COMPARISON:  01/17/2017 FINDINGS: Brain: Chronic generalized atrophy particularly affecting the temporal lobes. No focal brainstem or cerebellar infarction. Old infarction in the right PCA territory affecting the right occipital lobe. Chronic small-vessel ischemic changes affecting the cerebral hemispheric white matter. No sign of acute infarction, mass lesion, hemorrhage, hydrocephalus or extra-axial  collection. Vascular: There is atherosclerotic calcification of the major vessels at the base of the brain. Skull: Negative Sinuses/Orbits: Inflammatory changes of the paranasal sinuses. Other: None IMPRESSION: No acute intracranial finding. Generalized atrophy, temporal lobe predominant. Chronic small-vessel ischemic changes. Old right PCA territory infarction, which was acute on 03/11/2016. Sinusitis. Electronically Signed   By: Paulina FusiMark  Shogry M.D.   On: 10/06/2017 16:34        Scheduled Meds: . aspirin  325 mg Oral Daily  . benzonatate  100 mg Oral TID  . diltiazem  60 mg Oral Q8H  . docusate  100 mg Oral Daily  . guaiFENesin-dextromethorphan  10 mL Oral Q6H  . heparin injection (subcutaneous)  5,000 Units Subcutaneous Q8H  . hydrALAZINE  50 mg Oral Q6H  . ipratropium  0.5 mg Nebulization TID  . levalbuterol  0.63 mg Nebulization TID  . mouth rinse  15 mL Mouth Rinse BID  . metoprolol tartrate  100 mg Oral BID  . pantoprazole sodium  40 mg Oral Daily  . PARoxetine  10 mg Oral Daily  . polyethylene glycol  17 g Oral Q8H  . potassium chloride  40 mEq Oral BID   Continuous Infusions: . sodium chloride 75 mL/hr at 10/07/17 0911  . ampicillin-sulbactam (UNASYN) IV Stopped (10/07/17 0941)     LOS: 7 days    Time spent: 35 minutes.    Kathlen ModyVijaya Aloise Copus, MD [facility] Pager [phone]  If 7PM-7AM, please contact night-coverage [email or web address] Password [email or web address] 10/07/2017, 2:48 PM

## 2017-10-07 NOTE — Progress Notes (Signed)
White gold colored ring with white stone removed and given  to Daughter in [place]. Left hand swollen.

## 2017-10-07 NOTE — Progress Notes (Signed)
I was able to reach patient's son.  He is working out of town and will not be at the hospital in the next few days (at least during palliative care coverage hours).  Therefore we have set up a phone call for 3PM today to discuss goals of care.  Brooke [name], MD [facility] Palliative Medicine Team [phone]

## 2017-10-07 NOTE — Progress Notes (Signed)
Date: October 07, 2017 Marcelle SmilingRhonda Ikran Patman, BSN, [place], [place] [phone] Chart and notes review for patient progress and needs. Will follow for case management and discharge needs./ palliative Care meeting set up for 3pm today with family. Next review date: [id number]

## 2017-10-07 NOTE — Progress Notes (Signed)
SLP Cancellation Note  Patient Details Name: Brooke Phelps MRN: [id number] DOB: July 19, 1928   Cancelled treatment:       Reason Eval/Treat Not Completed: Other (comment)(palliative meeting planned for today at 1500 per palliative notes; will defer for follow up until after GOC determined, thanks)   Chales AbrahamsKimball, Ashlyne Olenick Ann 10/07/2017, 1:11 PM  Donavan Burnetamara Holston Oyama, MS [facility] S[facility] [phone]

## 2017-10-08 DIAGNOSIS — J690 Pneumonitis due to inhalation of food and vomit: Secondary | ICD-10-CM

## 2017-10-08 LAB — BASIC METABOLIC PANEL
Anion gap: 8 (ref 5–15)
BUN: <5 mg/dL — ABNORMAL LOW (ref 6–20)
CALCIUM: 8.2 mg/dL — AB (ref 8.9–10.3)
CHLORIDE: 99 mmol/L — AB (ref 101–111)
CO2: 29 mmol/L (ref 22–32)
CREATININE: 0.57 mg/dL (ref 0.44–1.00)
GFR calc non Af Amer: >60 mL/min (ref >60)
GLUCOSE: 115 mg/dL — AB (ref 65–99)
Potassium: 3.9 mmol/L (ref 3.5–5.1)
Sodium: 136 mmol/L (ref 135–145)

## 2017-10-08 LAB — CBC
HEMATOCRIT: 39.7 % (ref 36.0–46.0)
HEMOGLOBIN: 12.5 g/dL (ref 12.0–15.0)
MCH: 29.6 pg (ref 26.0–34.0)
MCHC: 31.5 g/dL (ref 30.0–36.0)
MCV: 94.1 fL (ref 78.0–100.0)
Platelets: 399 10*3/uL (ref 150–400)
RBC: 4.22 MIL/uL (ref 3.87–5.11)
RDW: 14.7 % (ref 11.5–15.5)
WBC: 7.5 10*3/uL (ref 4.0–10.5)

## 2017-10-08 MED ORDER — PAROXETINE HCL 10 MG PO TABS
10.0000 mg | ORAL_TABLET | Freq: Every day | ORAL | Status: DC
  Administered 2017-10-08 – 2017-10-09 (×2): 10 mg via ORAL
  Filled 2017-10-08 (×3): qty 1

## 2017-10-08 NOTE — Progress Notes (Signed)
  Speech Language Pathology Treatment: Dysphagia  Patient Details Name: Brooke Phelps MRN: [id number] DOB: 1928-09-07 Today's Date: 10/08/2017 Time: 1348-1400 SLP Time Calculation (min) (ACUTE ONLY): 12 min  Assessment / Plan / Recommendation Clinical Impression  Pt seen with daughter at bedside who confirmed plan to initiate comfort feeding with known risk. SLP able to provide 4 oz of thin water via cup and straw with one delayed cough. Pt also consumed 2 oz of applesauce without difficulty. Daughter reports the family prefers pureed textures as she does not eat many solid foods at baseline. SLP removed dentured and cleaned it, reinforced importance of oral care as the best preventative measure to reduce risk of aspiration in the setting of dysphagia. Education complete, will sign off.   HPI HPI: Brooke Phelps a 84 y.o.femalewith known HTN, dementia, presented via EMS from [place] home with main concern of ongoing fevers, exertional dyspnea, productive cough of clear sputum. Pt is in memory care unit and due to advanced dementia is not able to provide detailed information. Chest x ray with Patchy right lung opacities concerning for pneumonia.       SLP Plan  All goals met;Discharge SLP treatment due to (comment)       Recommendations  Diet recommendations: Dysphagia 1 (puree);Thin liquid Liquids provided via: Cup;Straw Medication Administration: Crushed with puree Supervision: Staff to assist with self feeding;Full supervision/cueing for compensatory strategies Compensations: Slow rate;Small sips/bites                Oral Care Recommendations: Oral care BID Follow up Recommendations: Skilled Nursing facility SLP Visit Diagnosis: Dysphagia, oropharyngeal phase (R13.12) Plan: All goals met;Discharge SLP treatment due to (comment)       GO               Herbie Baltimore, MA CCC-SLP [phone]  Lynann Beaver 10/08/2017, 2:11 PM

## 2017-10-08 NOTE — Progress Notes (Signed)
Daily Progress Note   Patient Name: Brooke Phelps       Date: 10/08/2017 DOB: 28-Jul-1928  Age: 83 y.o. MRN#: [id number] Attending Physician: Kathlen ModyAkula, Vijaya, MD Primary Care Physician: [name], [name], [name] Admit Date: 09/30/2017  Reason for Consultation/Follow-up: Disposition and Establishing goals of care  Subjective: I saw and examined Brooke Phelps this morning.  She remains confused.    Denies any complaints at this time.  Length of Stay: 8  Current Medications: Scheduled Meds:  . aspirin  325 mg Oral Daily  . benzonatate  100 mg Oral TID  . diltiazem  60 mg Oral Q8H  . guaiFENesin-dextromethorphan  10 mL Oral Q6H  . heparin injection (subcutaneous)  5,000 Units Subcutaneous Q8H  . hydrALAZINE  50 mg Oral Q6H  . ipratropium  0.5 mg Nebulization TID  . levalbuterol  0.63 mg Nebulization TID  . mouth rinse  15 mL Mouth Rinse BID  . metoprolol tartrate  100 mg Oral BID  . PARoxetine  10 mg Oral Daily    Continuous Infusions: . sodium chloride 75 mL/hr at 10/08/17 0700  . ampicillin-sulbactam (UNASYN) IV 3 g (10/08/17 1501)    PRN Meds: acetaminophen, acetaminophen, ALPRAZolam, alum & mag hydroxide-simeth, hydrALAZINE, ipratropium, levalbuterol, metoprolol tartrate  Physical Exam   General: Sleepy but arouses easily, confused, in no acute distress.  Heart: Irregular. No murmur appreciated. Lungs: Diminished air movement Abdomen: Soft, nontender, nondistended, positive bowel sounds.  Ext: No significant edema Skin: Warm and dry Neuro: Moves 4 extremities.  No deficits noted.  Vital Signs: BP (!) 176/47   Pulse 66   Temp 98.9 F (37.2 C) (Oral)   Resp (!) 23   Ht 5\' 4"  (1.626 m)   Wt 80.8 kg (178 lb 2.1 oz)   SpO2 95%   BMI 30.58 kg/m  SpO2: SpO2: 95 % O2  Device: O2 Device: Nasal Cannula O2 Flow Rate: O2 Flow Rate (L/min): 3 L/min  Intake/output summary:   Intake/Output Summary (Last 24 hours) at 10/08/2017 1515 Last data filed at 10/08/2017 0800 Gross per 24 hour  Intake 1650 ml  Output 1400 ml  Net 250 ml   LBM: Last BM Date: 10/07/17 Baseline Weight: Weight: 76.9 kg (169 lb 8.5 oz) Most recent weight: Weight: 80.8 kg (178 lb 2.1 oz)  Palliative Assessment/Data:    Flowsheet Rows     Most Recent Value  Intake Tab  Referral Department  Hospitalist  Unit at Time of Referral  Intermediate Care Unit  Palliative Care Primary Diagnosis  Sepsis/Infectious Disease  Date Notified  10/06/17  Palliative Care Type  New Palliative care  Reason for referral  Clarify Goals of Care  Date of Admission  09/30/17  Date first seen by Palliative Care  10/07/17  # of days Palliative referral response time  1 Day(s)  # of days IP prior to Palliative referral  6  Clinical Assessment  Palliative Performance Scale Score  40%  Pain Max last 24 hours  Not able to report  Pain Min Last 24 hours  Not able to report  Psychosocial & Spiritual Assessment  Palliative Care Outcomes      Patient Active Problem List   Diagnosis Date Noted  . HCAP (healthcare-associated pneumonia) 09/30/2017  . Acute encephalopathy 03/12/2016  . UTI (lower urinary tract infection) 03/12/2016  . Acute respiratory failure with hypoxia (HCC) 03/12/2016  . Altered mental status 03/11/2016  . Atrial flutter (HCC) 11/12/2013  . Hypoxia 11/11/2013  . Dementia with behavioral disturbance 11/11/2013  . ILD (interstitial lung disease) (HCC) 11/11/2013  . Bimalleolar ankle fracture 11/10/2013    Palliative Care Assessment & Plan   Patient Profile: 83 y.o. female  with past medical history of hypertension, dementia, long-term resident at skilled facility admitted on 09/30/2017 with healthcare associated pneumonia.  She has completed a 7-day course of antibiotics but  continues to require higher levels of oxygen.  She also failed swallow evaluation is currently n.p.o. except for medications.  Palliative consulted for goals of care.  Assessment: Patient Active Problem List   Diagnosis Date Noted  . HCAP (healthcare-associated pneumonia) 09/30/2017  . Acute encephalopathy 03/12/2016  . UTI (lower urinary tract infection) 03/12/2016  . Acute respiratory failure with hypoxia (HCC) 03/12/2016  . Altered mental status 03/11/2016  . Atrial flutter (HCC) 11/12/2013  . Hypoxia 11/11/2013  . Dementia with behavioral disturbance 11/11/2013  . ILD (interstitial lung disease) (HCC) 11/11/2013  . Bimalleolar ankle fracture 11/10/2013    Recommendations/Plan: -Requested speech therapy to evaluate her again in order to help determine what will be the best diet to restart to minimize her risk of immediate aspiration.  This is with understanding that she is going to continue to aspirate moving forward. -If she does not immediately aspirate and her condition stabilizes, plan will be to return to prior living environment at skilled facility. -If she immediately aspirates after restarting diet, we need to discuss again with son regarding full comfort care.  He reports that he would be agreeable to this if the time comes that she cannot maintain her own nutrition or hydration or if she has an aspiration event that causes further acute decompensation. - Appreciate SLP input.  Goals of Care and Additional Recommendations:  Limitations on Scope of Treatment: No Artificial Feeding  Code Status:    Code Status Orders  (From admission, onward)        Start     Ordered   09/30/17 1905  Do not attempt resuscitation (DNR)  Continuous    Question Answer Comment  In the event of cardiac or respiratory ARREST Do not call a "code blue"   In the event of cardiac or respiratory ARREST Do not perform Intubation, CPR, defibrillation or ACLS   In the event of cardiac or  respiratory ARREST Use medication by any  route, position, wound care, and other measures to relive pain and suffering. May use oxygen, suction and manual treatment of airway obstruction as needed for comfort.      09/30/17 1904    Code Status History    Date Active Date Inactive Code Status Order ID Comments User Context   09/30/2017 17:05 09/30/2017 19:04 DNR [id number]  Alvira MondaySchlossman, Erin, MD ED   03/12/2016 01:46 03/13/2016 21:23 DNR [id number]  Eduard ClosKakrakandy, Arshad N, MD Inpatient   11/11/2013 13:02 11/14/2013 18:54 Full Code [id number]  Nadara Mustarduda, Marcus V, MD Inpatient   11/11/2013 02:00 11/11/2013 13:02 DNR [id number]  Houston SirenLe, Peter, MD Inpatient    Advance Directive Documentation     Most Recent Value  Type of Advance Directive  Healthcare Power of Attorney, Out of facility DNR (pink MOST or yellow form)  Pre-existing out of facility DNR order (yellow form or pink MOST form)  Pink MOST form placed in chart (order not valid for inpatient use)  "MOST" Form in Place?  No data       Prognosis: Guarded  Discharge Planning:  To Be Determined  Care plan was discussed with bedside RN  Thank you for allowing the Palliative Medicine Team to assist in the care of this patient.   Total Time 20 Prolonged Time Billed No      Greater than 50%  of this time was spent counseling and coordinating care related to the above assessment and plan.  Romie MinusGene Anaisha Mago, MD  Please contact Palliative Medicine Team phone at [phone] for questions and concerns.

## 2017-10-08 NOTE — Consult Note (Signed)
Consultation Note Date: 10/08/2017   Patient Name: Brooke Phelps  DOB: 1928/10/12  MRN: [id number]  Age / Sex: 83 y.o., female  PCP: Elizabeth Palau, [name] Referring Physician: Kathlen Mody, MD  Reason for Consultation: Establishing goals of care  HPI/Patient Profile: 83 y.o. female  with past medical history of hypertension, dementia, long-term resident at skilled facility admitted on 09/30/2017 with healthcare associated pneumonia.  She has completed a 7-day course of antibiotics but continues to require higher levels of oxygen.  She also failed swallow evaluation is currently n.p.o. except for medications.  Palliative consulted for goals of care.  Clinical Assessment and Goals of Care: Ms. Maske is confused at baseline and unable to participate in conversation. On examination she is in no acute distress but is unsure why she is in the hospital or plan moving forward.  The only request she had for me at the time of my visit was for me to turn the "church channel" on the TV.  I called and spoke with her son, Aurther Loft.  Values and goals of care important to patient and family were attempted to be elicited.  He reports that he is very familiar with complications of dementia and aspiration.  He reports his father died from aspiration event around a month ago.  We discussed clinical course as well as wishes moving forward in regard to advanced directives.  We discussed that she has had slow improvement with treatment for this pneumonia and is at very high risk for continued decompensation related to aspiration events.  We discussed difference between a aggressive medical intervention path and a palliative, comfort focused care path.    SUMMARY OF RECOMMENDATIONS   -Her son understands that she is at high risk for recurrent aspiration. -We discussed a plan to ask speech therapy to evaluate her again in order to  help determine what will be the best diet to restart to minimize her risk of immediate aspiration.  This would be, however, with understanding that she is going to continue to aspirate moving forward. -If she does not immediately aspirate and her condition stabilizes, plan will be to return to prior living environment at skilled facility. -If she immediately aspirates after restarting diet, we discussed plan for full comfort care.  He reports that he would be agreeable to this if the time comes that she cannot maintain her own nutrition or hydration or if she has an aspiration event that causes further acute decompensation.  Code Status/Advance Care Planning:  DNR  Palliative Prophylaxis:   Aspiration and Delirium Protocol  Psycho-social/Spiritual:   Desire for further Chaplaincy support: Did not address today  Additional Recommendations: Education on Hospice  Prognosis:   Guarded  Discharge Planning: To Be Determined      Primary Diagnoses: Present on Admission: . HCAP (healthcare-associated pneumonia) . Hypoxia . Dementia with behavioral disturbance . ILD (interstitial lung disease) (HCC) . Atrial flutter (HCC) . Acute respiratory failure with hypoxia (HCC)   I have reviewed the medical record, interviewed the patient and family,  and examined the patient. The following aspects are pertinent.  Past Medical History:  Diagnosis Date  . Asthma   . Atrial fibrillation (HCC)   . COPD (chronic obstructive pulmonary disease) (HCC)   . Dementia    Social History   Socioeconomic History  . Marital status: Married    Spouse name: None  . Number of children: None  . Years of education: None  . Highest education level: None  Social Needs  . Financial resource strain: None  . Food insecurity - worry: None  . Food insecurity - inability: None  . Transportation needs - medical: None  . Transportation needs - non-medical: None  Occupational History  . None  Tobacco Use    . Smoking status: Never Smoker  . Smokeless tobacco: Never Used  Substance and Sexual Activity  . Alcohol use: No  . Drug use: No  . Sexual activity: None  Other Topics Concern  . None  Social History Narrative  . None   Family History  Problem Relation Age of Onset  . Dementia Sister   . Diabetes Mellitus II Neg Hx   . Hypertension Neg Hx    Scheduled Meds: . aspirin  325 mg Oral Daily  . benzonatate  100 mg Oral TID  . diltiazem  60 mg Oral Q8H  . docusate  100 mg Oral Daily  . guaiFENesin-dextromethorphan  10 mL Oral Q6H  . heparin injection (subcutaneous)  5,000 Units Subcutaneous Q8H  . hydrALAZINE  50 mg Oral Q6H  . ipratropium  0.5 mg Nebulization TID  . levalbuterol  0.63 mg Nebulization TID  . mouth rinse  15 mL Mouth Rinse BID  . metoprolol tartrate  100 mg Oral BID  . pantoprazole sodium  40 mg Oral Daily  . PARoxetine  10 mg Oral Daily  . polyethylene glycol  17 g Oral Q8H   Continuous Infusions: . sodium chloride 75 mL/hr at 10/08/17 0700  . ampicillin-sulbactam (UNASYN) IV Stopped (10/08/17 0432)   PRN Meds:.acetaminophen, acetaminophen, ALPRAZolam, alum & mag hydroxide-simeth, hydrALAZINE, ipratropium, levalbuterol, metoprolol tartrate Medications Prior to Admission:  Prior to Admission medications   Medication Sig Start Date End Date Taking? Authorizing Provider  acetaminophen (TYLENOL) 500 MG tablet Take 2 tablets (1,000 mg total) by mouth 3 (three) times daily. Patient taking differently: Take 500 mg by mouth every 4 (four) hours as needed for mild pain.  11/14/13  Yes Ghimire, Werner LeanShanker M, MD  ALPRAZolam Prudy Feeler(XANAX) 0.5 MG tablet Take 1 tablet (0.5 mg total) by mouth every 8 (eight) hours as needed for anxiety. Patient taking differently: Take 0.5 mg by mouth 2 (two) times daily.  11/14/13  Yes Ghimire, Werner LeanShanker M, MD  alum & mag hydroxide-simeth (MINTOX) 200-200-20 MG/5ML suspension Take 30 mLs by mouth every 6 (six) hours as needed for indigestion or  heartburn.   Yes [provider]  aspirin 325 MG tablet Take 1 tablet (325 mg total) by mouth daily. 11/14/13  Yes Ghimire, Werner LeanShanker M, MD  benzonatate (TESSALON) 100 MG capsule Take 100 mg by mouth daily.   Yes [provider]  docusate sodium 100 MG CAPS Take 100 mg by mouth 2 (two) times daily. Patient taking differently: Take 100 mg by mouth daily.  11/14/13  Yes Ghimire, Werner LeanShanker M, MD  guaiFENesin (MUCINEX) 600 MG 12 hr tablet Take 600 mg by mouth 2 (two) times daily.   Yes [provider]  guaifenesin (ROBITUSSIN) 100 MG/5ML syrup Take 200 mg by mouth 4 (four) times  daily as needed for cough.   Yes [provider]  levalbuterol (XOPENEX) 0.63 MG/3ML nebulizer solution Take 3 mLs (0.63 mg total) by nebulization 3 (three) times daily. Patient taking differently: Take 0.63 mg by nebulization every 4 (four) hours as needed for wheezing.  11/14/13  Yes Ghimire, Werner LeanShanker M, MD  loperamide (IMODIUM) 2 MG capsule Take 2 mg by mouth as needed for diarrhea or loose stools.   Yes [provider]  magnesium hydroxide (MILK OF MAGNESIA) 400 MG/5ML suspension Take 30 mLs by mouth daily as needed for mild constipation.   Yes [provider]  metoprolol (LOPRESSOR) 50 MG tablet Take 1 tablet (50 mg total) by mouth 2 (two) times daily. 11/14/13  Yes Ghimire, Werner LeanShanker M, MD  Neomycin-Bacitracin-Polymyxin (TRIPLE ANTIBIOTIC) [phone] OINT Apply 1 application topically daily as needed (skin tears/abrasions).    Yes [provider]  omeprazole (PRILOSEC) 20 MG capsule Take 20 mg by mouth daily.   Yes [provider]  PARoxetine (PAXIL) 10 MG tablet Take 1 tablet (10 mg total) by mouth daily. 11/14/13  Yes Ghimire, Werner LeanShanker M, MD  UNABLE TO FIND Take 1 each by mouth 3 (three) times daily. Mighty Shakes   Yes [provider]  FLUZONE HIGH-DOSE 0.5 ML injection Inject 0.5 mLs into the skin once. 07/14/17 07/14/17   [provider]    furosemide (LASIX) 20 MG tablet Take 1 tablet (20 mg total) by mouth every other day. Patient not taking: Reported on 09/30/2017 03/13/16   Rhetta MuraSamtani, Jai-Gurmukh, MD  nitrofurantoin, macrocrystal-monohydrate, (MACROBID) 100 MG capsule Take 1 capsule (100 mg total) by mouth every 12 (twelve) hours. Patient not taking: Reported on 05/05/2016 03/13/16   Rhetta MuraSamtani, Jai-Gurmukh, MD   No Known Allergies Review of Systems Unable to obtain due to mental status  Physical Exam General: Sleepy but arouses easily, confused, in no acute distress.  Heart: Irregular. No murmur appreciated. Lungs: Diminished air movement Abdomen: Soft, nontender, nondistended, positive bowel sounds.  Ext: No significant edema Skin: Warm and dry Neuro: Moves 4 extremities.  No deficits noted.  Vital Signs: BP (!) 176/47   Pulse 66   Temp (!) 97.5 F (36.4 C) (Axillary)   Resp (!) 23   Ht 5\' 4"  (1.626 m)   Wt 80.8 kg (178 lb 2.1 oz)   SpO2 100%   BMI 30.58 kg/m  Pain Assessment: No/denies pain POSS *See Group Information*: S-Acceptable,Sleep, easy to arouse Pain Score: Asleep   SpO2: SpO2: 100 % O2 Device:SpO2: 100 % O2 Flow Rate: .O2 Flow Rate (L/min): 4 L/min  IO: Intake/output summary:   Intake/Output Summary (Last 24 hours) at 10/08/2017 0914 Last data filed at 10/08/2017 0800 Gross per 24 hour  Intake 4700 ml  Output 1400 ml  Net 3300 ml    LBM: Last BM Date: 10/07/17 Baseline Weight: Weight: 76.9 kg (169 lb 8.5 oz) Most recent weight: Weight: 80.8 kg (178 lb 2.1 oz)     Palliative Assessment/Data:   Flowsheet Rows     Most Recent Value  Intake Tab  Referral Department  Hospitalist  Unit at Time of Referral  Intermediate Care Unit  Palliative Care Primary Diagnosis  Sepsis/Infectious Disease  Date Notified  10/06/17  Palliative Care Type  New Palliative care  Reason for referral  Clarify Goals of Care  Date of Admission  09/30/17  Date first seen by Palliative Care  10/07/17  # of days  Palliative referral response time  1 Day(s)  # of days IP  prior to Palliative referral  6  Clinical Assessment  Palliative Performance Scale Score  40%  Pain Max last 24 hours  Not able to report  Pain Min Last 24 hours  Not able to report  Psychosocial & Spiritual Assessment  Palliative Care Outcomes      Time In: 1445 Time Out: 1545 Time Total: 60 Greater than 50%  of this time was spent counseling and coordinating care related to the above assessment and plan.  Signed by: Romie Minus, MD   Please contact Palliative Medicine Team phone at [phone] for questions and concerns.  For individual provider: See Loretha Stapler

## 2017-10-08 NOTE — Progress Notes (Addendum)
PROGRESS NOTE    Brooke Phelps  ZOX:[id number] DOB: 05/28/1928 DOA: 09/30/2017 PCP: Elizabeth Palau, [name]    Brief Narrative:  Brooke Phelps is a 83 y.o. female with known HTN, dementia, presented via EMS from [facility] with main concern of ongoing fevers, exertional dyspnea, productive cough of clear sputum. She was admitted for health care associated pneumonia.  Despite 7 days of treatment patient continues to decline, require up to 4-5 L of nasal cannula oxygen to keep sats greater than 90%.  She failed swallow evaluation and currently n.p.o. except for meds.  Palliative care consult requested for goals of care meeting.  She received 6 days of IV cefepime followed by Unasyn.    Assessment & Plan:   Principal Problem:   HCAP (healthcare-associated pneumonia) Active Problems:   Hypoxia   Dementia with behavioral disturbance   ILD (interstitial lung disease) (HCC)   Atrial flutter (HCC)   Acute respiratory failure with hypoxia (HCC)   Acute respiratory failure with hypoxia secondary to right lower lobe pneumonia/healthcare associated pneumonia Started on vancomycin and cefepime later on vancomycin discontinued.  Completed 5 days of cefepime and changed to unasyn.  Respiratory culture showed Moraxella.  Urine for streptococcal pneumonia antigen and Legionella are negative. Blood cultures have been negative so far. Respiratory virus panel positive for rhinovirus. Since patient continues to have fevers, cough and requiring about 6 L of nasal cannula oxygen, we have repeated chest x-ray, showed worsening of the pneumonia, possibly aspiration.  Swallow evaluation recommended n.p.o. except for meds.  Patient's more awake alert today and repeat swallow evaluation, dysphagia 1 diet. Continue with duo nebs, respiratory consult for pulmonary hygiene. Patient completed about 9 days of IV antibiotics and she is starting to have loose watery bowel movements so we will stop IV  antibiotics.  Hypertension Appears to be well controlled.  Atrial fibrillation with RVR Rate better with cardizem.    Functional quadriplegia patient is bedbound and wheelchair-bound. No changes.    Abnormal UA Urine cultures show multiple bacteria. Patient currently asymptomatic 9 days of IV antibiotics.   Hypokalemia replaced, repeat in am is normal   In view of the multiple medical problems and clinically not improving, with  Worsening aspiration pneumonia, a CT head without contrast ordered to evaluate for stroke and requested palliative care consult for goals of care discussion.    As per the family the would like the patient to have comfort feeds and if she continues to aspirate and and if she clinically deteriorate the one the patient to be transition to full comfort care.  But if she continues to improve and does not immediately aspirate they would like her to be discharged to the left   Loose bowel movements Probably from IV antibiotics/Colace and MiraLAX.  Stop Unasyn, Colace, MiraLAX and monitor.  If she continues to have watery bowel movements tonight stopping the n.p.o. we will get a C. difficile PCR for further evaluation   DVT prophylaxis: Heparin sub Code Status: DNR Family Communication: None at bedside, discussed with family on 12/4  Disposition Plan: SNF in a.m.   Consultants:   Speech therapy   Procedures: None   Antimicrobials:cefepime since admission   Subjective: She is confused but appears comfortable.  She denies any nausea vomiting abdominal pain, chest pain or shortness of breath. Objective: Vitals:   10/08/17 1100 10/08/17 1200 10/08/17 1300 10/08/17 1405  BP: (!) 175/58  (!) 175/61   Pulse: 67  61   Resp: (!)  21  14   Temp:  98.9 F (37.2 C)    TempSrc:  Oral    SpO2: (!) 89%  92% 95%  Weight:      Height:        Intake/Output Summary (Last 24 hours) at 10/08/2017 1722 Last data filed at 10/08/2017 1501 Gross per 24 hour    Intake 2095 ml  Output 1400 ml  Net 695 ml   Filed Weights   10/01/17 1029 10/03/17 0425  Weight: 76.9 kg (169 lb 8.5 oz) 80.8 kg (178 lb 2.1 oz)    Examination:  General exam: Appears calm and comfortable not in any kind of distress Respiratory system: Air entry fair no wheezing or rhonchi Cardiovascular system: S1 & S2 heard, irregular, no JVD,  No pedal edema. Gastrointestinal system: Soft, nontender nondistended, bowel sounds are good Central nervous system: Alert, confused, nonfocal Extremities: No pedal edema cyanosis or clubbing Skin: No rashes, lesions or ulcers Psychiatry: . Mood & affect appropriate.      Data Reviewed: I have personally reviewed following labs and imaging studies  CBC: Recent Labs  Lab 10/04/17 0318 10/05/17 0300 10/06/17 0311 10/07/17 0254 10/08/17 0305  WBC 7.7 6.5 7.6 8.5 7.5  HGB 11.9* 12.6 12.5 12.7 12.5  HCT 37.2 39.7 39.0 39.2 39.7  MCV 93.2 93.4 93.1 92.9 94.1  PLT 303 339 364 400 399   Basic Metabolic Panel: Recent Labs  Lab 10/04/17 0318 10/05/17 0300 10/06/17 0311 10/07/17 0254 10/08/17 0305  NA 136 139 135 137 136  K 3.1* 3.2* 3.1* 3.1* 3.9  CL 101 103 100* 98* 99*  CO2 27 28 28 30 29   GLUCOSE 134* 127* 127* 115* 115*  BUN 5* <5* <5* <5* <5*  CREATININE 0.60 0.57 0.54 0.54 0.57  CALCIUM 8.3* 8.4* 8.2* 8.1* 8.2*   GFR: Estimated Creatinine Clearance: 50.9 mL/min (by C-G formula based on SCr of 0.57 mg/dL). Liver Function Tests: No results for input(s): AST, ALT, ALKPHOS, BILITOT, PROT, ALBUMIN in the last 168 hours. No results for input(s): LIPASE, AMYLASE in the last 168 hours. No results for input(s): AMMONIA in the last 168 hours. Coagulation Profile: No results for input(s): INR, PROTIME in the last 168 hours. Cardiac Enzymes: No results for input(s): CKTOTAL, CKMB, CKMBINDEX, TROPONINI in the last 168 hours. BNP (last 3 results) No results for input(s): PROBNP in the last 8760 hours. HbA1C: No results  for input(s): HGBA1C in the last 72 hours. CBG: No results for input(s): GLUCAP in the last 168 hours. Lipid Profile: No results for input(s): CHOL, HDL, LDLCALC, TRIG, CHOLHDL, LDLDIRECT in the last 72 hours. Thyroid Function Tests: No results for input(s): TSH, T4TOTAL, FREET4, T3FREE, THYROIDAB in the last 72 hours. Anemia Panel: No results for input(s): VITAMINB12, FOLATE, FERRITIN, TIBC, IRON, RETICCTPCT in the last 72 hours. Sepsis Labs: No results for input(s): PROCALCITON, LATICACIDVEN in the last 168 hours.  Recent Results (from the past 240 hour(s))  Culture, blood (Routine x 2)     Status: None   Collection Time: 09/30/17  2:00 PM  Result Value Ref Range Status   Specimen Description BLOOD RIGHT FOREARM  Final   Special Requests   Final    BOTTLES DRAWN AEROBIC AND ANAEROBIC Blood Culture results may not be optimal due to an inadequate volume of blood received in culture bottles   Culture   Final    NO GROWTH 5 DAYS Performed at [facility], [address] [place], [place] [address]  Report Status 10/05/2017 FINAL  Final  Culture, blood (Routine x 2)     Status: None   Collection Time: 09/30/17  2:05 PM  Result Value Ref Range Status   Specimen Description BLOOD LEFT ANTECUBITAL  Final   Special Requests   Final    BOTTLES DRAWN AEROBIC AND ANAEROBIC Blood Culture adequate volume   Culture   Final    NO GROWTH 5 DAYS Performed at [facility], [address] [place], [place] [address]    Report Status 10/05/2017 FINAL  Final  Urine culture     Status: Abnormal   Collection Time: 09/30/17  5:30 PM  Result Value Ref Range Status   Specimen Description URINE, RANDOM  Final   Special Requests NONE  Final   Culture MULTIPLE SPECIES PRESENT, SUGGEST RECOLLECTION (A)  Final   Report Status 10/02/2017 FINAL  Final  Culture, sputum-assessment     Status: None   Collection Time: 09/30/17  7:05 PM  Result Value Ref Range Status   Specimen Description  SPUTUM  Final   Special Requests NONE  Final   Sputum evaluation THIS SPECIMEN IS ACCEPTABLE FOR SPUTUM CULTURE  Final   Report Status 09/30/2017 FINAL  Final  Culture, respiratory (NON-Expectorated)     Status: None   Collection Time: 09/30/17  7:05 PM  Result Value Ref Range Status   Specimen Description SPUTUM  Final   Special Requests NONE Reflexed from [id number]  Final   Gram Stain   Final    ABUNDANT WBC PRESENT, PREDOMINANTLY PMN FEW GRAM POSITIVE COCCI IN PAIRS    Culture   Final    RARE MORAXELLA CATARRHALIS(BRANHAMELLA) BETA LACTAMASE POSITIVE Performed at [facility], [address], [place], [place] [address]    Report Status 10/03/2017 FINAL  Final  Respiratory Panel by PCR     Status: Abnormal   Collection Time: 09/30/17  7:21 PM  Result Value Ref Range Status   Adenovirus NOT DETECTED NOT DETECTED Final   Coronavirus 229E NOT DETECTED NOT DETECTED Final   Coronavirus HKU1 NOT DETECTED NOT DETECTED Final   Coronavirus NL63 NOT DETECTED NOT DETECTED Final   Coronavirus OC43 NOT DETECTED NOT DETECTED Final   Metapneumovirus NOT DETECTED NOT DETECTED Final   Rhinovirus / Enterovirus DETECTED (A) NOT DETECTED Final   Influenza A NOT DETECTED NOT DETECTED Final   Influenza B NOT DETECTED NOT DETECTED Final   Parainfluenza Virus 1 NOT DETECTED NOT DETECTED Final   Parainfluenza Virus 2 NOT DETECTED NOT DETECTED Final   Parainfluenza Virus 3 NOT DETECTED NOT DETECTED Final   Parainfluenza Virus 4 NOT DETECTED NOT DETECTED Final   Respiratory Syncytial Virus NOT DETECTED NOT DETECTED Final   Bordetella pertussis NOT DETECTED NOT DETECTED Final   Chlamydophila pneumoniae NOT DETECTED NOT DETECTED Final   Mycoplasma pneumoniae NOT DETECTED NOT DETECTED Final    Comment: Performed at [facility], [address], [place], [place] [address]  MRSA PCR Screening     Status: None   Collection Time: 09/30/17  7:36 PM  Result Value Ref Range Status   MRSA by PCR  NEGATIVE NEGATIVE Final    Comment:        The GeneXpert MRSA Assay (FDA approved for NASAL specimens only), is one component of a comprehensive MRSA colonization surveillance program. It is not intended to diagnose MRSA infection nor to guide or monitor treatment for MRSA infections.  Radiology Studies: No results found.      Scheduled Meds: . aspirin  325 mg Oral Daily  . benzonatate  100 mg Oral TID  . diltiazem  60 mg Oral Q8H  . guaiFENesin-dextromethorphan  10 mL Oral Q6H  . heparin injection (subcutaneous)  5,000 Units Subcutaneous Q8H  . hydrALAZINE  50 mg Oral Q6H  . ipratropium  0.5 mg Nebulization TID  . levalbuterol  0.63 mg Nebulization TID  . mouth rinse  15 mL Mouth Rinse BID  . metoprolol tartrate  100 mg Oral BID  . PARoxetine  10 mg Oral Daily   Continuous Infusions: . sodium chloride 75 mL/hr at 10/08/17 0700  . ampicillin-sulbactam (UNASYN) IV Stopped (10/08/17 1531)     LOS: 8 days    Time spent: 35 minutes.    Kathlen ModyVijaya Deysy Schabel, MD [facility] Pager [phone]  If 7PM-7AM, please contact night-coverage [email or web address] Password [email or web address] 10/08/2017, 5:22 PM

## 2017-10-09 MED ORDER — HYDRALAZINE HCL 50 MG PO TABS: 50.0000 mg | ORAL_TABLET | Freq: Four times a day (QID) | ORAL | 0 refills | Status: AC

## 2017-10-09 MED ORDER — DILTIAZEM HCL 60 MG PO TABS: 60.0000 mg | ORAL_TABLET | Freq: Three times a day (TID) | ORAL | 0 refills | Status: AC

## 2017-10-09 MED ORDER — METOPROLOL TARTRATE 100 MG PO TABS: 100.0000 mg | ORAL_TABLET | Freq: Two times a day (BID) | ORAL | 0 refills | Status: DC

## 2017-10-09 MED ORDER — IPRATROPIUM BROMIDE 0.02 % IN SOLN: 0.5000 mg | RESPIRATORY_TRACT | 12 refills | Status: AC | PRN

## 2017-10-09 NOTE — Care Management Note (Signed)
Case Management Note  Patient Details  Name: Brooke Phelps R Dalgleish MRN: [id number] Date of Birth: 13-Nov-1927  Subjective/Objective:     [facility].  Pt to be d/c on home O2 with HH.  Spoke to son , [name], via phone to [facility].     Action/Plan: [facility]. Family refuses NA and SW.  Son states [facility].  [facility], Jermaine, will have O[facility] concentr[facility].  Staff RN will call [facility] at [phone] prior to calling [facility] for transport.  Vaughan BastaJermaine advises will be at least 7:30 before concentrator delivered. Family advised.  Erie NoeVanessa, SW on site, will complete ambulance transport form.  Family requests to be notified when pt is transported.  Expected Discharge Date:  10/09/17               Expected Discharge Plan:  Assisted Living / Rest Home  In-House Referral:  Clinical Social Work  Discharge planning Services  CM Consult  Post Acute Care Choice:  Home Health Choice offered to:  Adult Children  DME Arranged:  Oxygen DME Agency:  [facility]  HH Arranged:  [facility]  [facility]  Status of Service:  Completed, signed off  If discussed at Long Length of Stay Meetings, dates discussed:    Additional Comments:  Verdene LennertGoldean, Jahniya Duzan K, RN 10/09/2017, 4:58 PM

## 2017-10-09 NOTE — NC FL2 (Signed)
[place] MEDICAID FL2 LEVEL OF CARE SCREENING TOOL     IDENTIFICATION  Patient Name: Brooke Phelps Birthdate: 12-16-1927 Sex: female Admission Date (Current Location): 09/30/2017  [place] and [place] Number:  [place] Bast [id number] K Facility and Address:  [facility],  [address] [place], [place] [address]      Provider Number: [id number]  Attending Physician Name and Address:  Kathlen Mody, MD  Relative Name and Phone Number:  Tawny Raspberry - son; [phone]    Current Level of Care: Hospital Recommended Level of Care: Assisted Living Facility(From [facility]) Prior Approval Number:    Date Approved/Denied:   PASRR Number:    Discharge Plan: Other (Comment)([facility])    Current Diagnoses: Patient Active Problem List   Diagnosis Date Noted  . HCAP (healthcare-associated pneumonia) 09/30/2017  . Acute encephalopathy 03/12/2016  . UTI (lower urinary tract infection) 03/12/2016  . Acute respiratory failure with hypoxia (HCC) 03/12/2016  . Altered mental status 03/11/2016  . Atrial flutter (HCC) 11/12/2013  . Hypoxia 11/11/2013  . Dementia with behavioral disturbance 11/11/2013  . ILD (interstitial lung disease) (HCC) 11/11/2013  . Bimalleolar ankle fracture 11/10/2013    Orientation RESPIRATION BLADDER Height & Weight     Self  O2(2 Liters oxygen) Incontinent, External catheter Weight: 178 lb 2.1 oz (80.8 kg) Height:  5\' 4"  (162.6 cm)  BEHAVIORAL SYMPTOMS/MOOD NEUROLOGICAL BOWEL NUTRITION STATUS      Incontinent Diet(DYS 1)  AMBULATORY STATUS COMMUNICATION OF NEEDS Skin   Limited Assist Verbally Normal                       Personal Care Assistance Level of Assistance  Bathing, Feeding, Dressing Bathing Assistance: Limited assistance Feeding assistance: Independent Dressing Assistance: Limited assistance     Functional Limitations Info  Sight, Hearing, Speech Sight Info: Adequate Hearing Info: Adequate Speech Info:  Adequate    SPECIAL CARE FACTORS FREQUENCY                       Contractures Contractures Info: Not present    Additional Factors Info  Code Status, Allergies Code Status Info: DNR Allergies Info: No known allergies           Current Medications (10/09/2017):  This is the current hospital active medication list Current Facility-Administered Medications  Medication Dose Route Frequency Provider Last Rate Last Dose  . 0.9 %  sodium chloride infusion   Intravenous Continuous Alison Murray, MD 75 mL/hr at 10/09/17 0204    . acetaminophen (TYLENOL) suppository 650 mg  650 mg Rectal Q6H PRN Nevin Bloodgood A, MD   650 mg at 10/04/17 1016  . acetaminophen (TYLENOL) tablet 650 mg  650 mg Oral Q6H PRN Nevin Bloodgood A, MD   650 mg at 10/04/17 2100  . ALPRAZolam Prudy Feeler) tablet 0.5 mg  0.5 mg Oral Q8H PRN Alison Murray, MD   0.5 mg at 10/08/17 1711  . alum & mag hydroxide-simeth (MAALOX/MYLANTA) 200-200-20 MG/5ML suspension 30 mL  30 mL Oral Q6H PRN Alison Murray, MD      . aspirin tablet 325 mg  325 mg Oral Daily Alison Murray, MD   325 mg at 10/09/17 1044  . benzonatate (TESSALON) capsule 100 mg  100 mg Oral TID Nevin Bloodgood A, MD   100 mg at 10/09/17 1044  . diltiazem (CARDIZEM) tablet 60 mg  60 mg Oral Q8H Kathlen Mody, MD   60 mg at 10/09/17  1044  . guaiFENesin-dextromethorphan (ROBITUSSIN DM) 100-10 MG/5ML syrup 10 mL  10 mL Oral Q6H Shahmehdi, Seyed A, MD   10 mL at 10/09/17 0900  . heparin injection 5,000 Units  5,000 Units Subcutaneous Q8H Shahmehdi, Seyed A, MD   5,000 Units at 10/09/17 0531  . hydrALAZINE (APRESOLINE) injection 10 mg  10 mg Intravenous Q4H PRN Alison Murrayevine, Alma M, MD   10 mg at 10/08/17 1946  . hydrALAZINE (APRESOLINE) tablet 50 mg  50 mg Oral Q6H Shahmehdi, Seyed A, MD   50 mg at 10/09/17 0532  . ipratropium (ATROVENT) nebulizer solution 0.5 mg  0.5 mg Nebulization Q4H PRN Alison Murrayevine, Alma M, MD   0.5 mg at 10/04/17 0530  . ipratropium (ATROVENT)  nebulizer solution 0.5 mg  0.5 mg Nebulization TID Alison Murrayevine, Alma M, MD   0.5 mg at 10/09/17 1450  . levalbuterol (XOPENEX) nebulizer solution 0.63 mg  0.63 mg Nebulization TID Alison Murrayevine, Alma M, MD   0.63 mg at 10/09/17 1450  . levalbuterol (XOPENEX) nebulizer solution 1.25 mg  1.25 mg Nebulization Q4H PRN Alison Murrayevine, Alma M, MD   1.25 mg at 10/04/17 0530  . MEDLINE mouth rinse  15 mL Mouth Rinse BID Shahmehdi, Seyed A, MD   15 mL at 10/09/17 1050  . metoprolol tartrate (LOPRESSOR) injection 2.5 mg  2.5 mg Intravenous QID PRN Alison Murrayevine, Alma M, MD   2.5 mg at 10/05/17 0513  . metoprolol tartrate (LOPRESSOR) tablet 100 mg  100 mg Oral BID Shahmehdi, Seyed A, MD   100 mg at 10/09/17 1040  . PARoxetine (PAXIL) tablet 10 mg  10 mg Oral Daily Kathlen ModyAkula, Vijaya, MD   10 mg at 10/09/17 1044     Discharge Medications: Please see discharge summary for a list of discharge medications.  Relevant Imaging Results:  Relevant Lab Results:   Additional Information DISCHARGE MEDICATIONS:  STOP taking these medications   furosemide 20 MG tablet Commonly known as:  LASIX   nitrofurantoin (macrocrystal-monohydrate) 100 MG capsule Commonly known as:  MACROBID     TAKE these medications   acetaminophen 500 MG tablet Commonly known as:  TYLENOL Take 2 tablets (1,000 mg total) by mouth 3 (three) times daily. What changed:    how much to take  when to take this  reasons to take this   ALPRAZolam 0.5 MG tablet Commonly known as:  XANAX Take 1 tablet (0.5 mg total) by mouth every 8 (eight) hours as needed for anxiety. What changed:  when to take this   aspirin 325 MG tablet Take 1 tablet (325 mg total) by mouth daily.   benzonatate 100 MG capsule Commonly known as:  TESSALON Take 100 mg by mouth daily.   diltiazem 60 MG tablet Commonly known as:  CARDIZEM Take 1 tablet (60 mg total) by mouth every 8 (eight) hours.   DSS 100 MG Caps Take 100 mg by mouth 2 (two) times daily. What changed:  when  to take this   FLUZONE HIGH-DOSE 0.5 ML injection Generic drug:  Influenza vac split quadrivalent PF Inject 0.5 mLs into the skin once. 07/14/17   guaiFENesin 600 MG 12 hr tablet Commonly known as:  MUCINEX Take 600 mg by mouth 2 (two) times daily. What changed:  Another medication with the same name was removed. Continue taking this medication, and follow the directions you see here.   hydrALAZINE 50 MG tablet Commonly known as:  APRESOLINE Take 1 tablet (50 mg total) by mouth every 6 (six) hours.   ipratropium  0.02 % nebulizer solution Commonly known as:  ATROVENT Take 2.5 mLs (0.5 mg total) by nebulization every 4 (four) hours as needed for wheezing or shortness of breath (if refractory to Xopenex).   levalbuterol 0.63 MG/3ML nebulizer solution Commonly known as:  XOPENEX Take 3 mLs (0.63 mg total) by nebulization 3 (three) times daily. What changed:    when to take this  reasons to take this   loperamide 2 MG capsule Commonly known as:  IMODIUM Take 2 mg by mouth as needed for diarrhea or loose stools.   magnesium hydroxide 400 MG/5ML suspension Commonly known as:  MILK OF MAGNESIA Take 30 mLs by mouth daily as needed for mild constipation.   metoprolol tartrate 100 MG tablet Commonly known as:  LOPRESSOR Take 1 tablet (100 mg total) by mouth 2 (two) times daily. What changed:    medication strength  how much to take   MINTOX 200-200-20 MG/5ML suspension Generic drug:  alum & mag hydroxide-simeth Take 30 mLs by mouth every 6 (six) hours as needed for indigestion or heartburn.   omeprazole 20 MG capsule Commonly known as:  PRILOSEC Take 20 mg by mouth daily.   PARoxetine 10 MG tablet Commonly known as:  PAXIL Take 1 tablet (10 mg total) by mouth daily.   TRIPLE ANTIBIOTIC [id number] Oint Apply 1 application topically daily as needed (skin tears/abrasions).   UNABLE TO FIND Take 1 each by mouth 3 (three) times daily. [employer]        Okey DupreCrawford, Lazaro ArmsVanessa Bradley, LCSW

## 2017-10-09 NOTE — Clinical Social Work Note (Signed)
Clinical Social Work Assessment  Patient Details  Name: Brooke Phelps MRN: [id number] Date of Birth: Nov 04, 1927  Date of referral:  10/09/17               Reason for consult:  [facility]                Permission sought to share information with:  Family Supports Permission granted to share information::  No(Patient oriented to self only)  Name::     Barrett Henleerry Linskey  Agency::     Relationship::  Son  Contact Information:  [phone]  Housing/Transportation Living arrangements for the past 2 months:  Assisted Living Facility([facility]) Source of Information:  Adult Children Patient Interpreter Needed:  None Criminal Activity/Legal Involvement Pertinent to Current Situation/Hospitalization:  No - Comment as needed Significant Relationships:  Adult Children Lives with:  Facility Resident([facility]) Do you feel safe going back to the place where you live?  Yes(Son expressed agreement to patient's return to ALF) Need for family participation in patient care:  Yes (Comment)  Care giving concerns: Son did not express any concerns regarding patient's care at ALF.  Social Worker assessment / plan:  CSW talked with patient's son, Barrett Henleerry Guzek by phone and confirmed that patient will discharge back to ALF. CSW and son talked later today  regarding facility's concern regarding oxygen and this was followed up by CSW in call to nurse case manager.  CSW advised by nurse case manager that Morrie SheldonAshley that room air sats will be needed prior to patient's discharge and then oxygen will be taken to ALF around 7:30 pm. Patient can then discharge. Morrie SheldonAshley contacted floor nurse to update her on what needs to be done before patient can discharge.    Employment status:  Retired Health and safety inspectornsurance information:  Armed forces operational officerMedicare, [place] PT Recommendations:  Not assessed at this time Information / Referral to community resources:  Other (Comment Required)(None needed  or requested as patient from ALF)  Patient/Family's Response to care: No concerns expressed by son regarding care during hospitalization.  Patient/Family's Understanding of and Emotional Response to Diagnosis, Current Treatment, and Prognosis: Son expressed concern regarding patient's need for oxygen and ALF informing him that they could not take patient as they had no O2 at facility. Son assured by CSW that contact would be made with ALF.   Emotional Assessment Appearance:  Other (Comment Required(Did not visit with patient. Talked with son by phone) Attitude/Demeanor/Rapport:  Unable to Assess Affect (typically observed):  Unable to Assess Orientation:  Oriented to Self Alcohol / Substance use:  Never Used Psych involvement (Current and /or in the community):  No (Comment)  Discharge Needs  Concerns to be addressed:  Other (Comment Required(Oxygen for patient at ALF as she was not on oxygen when admitted to hospital. Nurse case management assisted with setting up O2 at ALF) Readmission within the last 30 days:  No Current discharge risk:  None Barriers to Discharge:  Barriers Resolved(Oxygen for patient at ALF taken care of by nurse case maanger)   Cristobal Goldmannrawford, Hobie Kohles Bradley, LCSW 10/09/2017, 5:13 PM

## 2017-10-09 NOTE — Progress Notes (Addendum)
Report called to Janelle FloorArnetta, [facility] 15:00. Questions, concerns denied.  Patient is awaiting discharge. Will discharge  to facility after arrival of oxygen. [phone]

## 2017-10-09 NOTE — Progress Notes (Signed)
Report called to [facility]netta med [facility]. Questions, concerns denied

## 2017-10-09 NOTE — Discharge Summary (Addendum)
Physician Discharge Summary  [name] ZOX:[id number] DOB: Jul 01, 1928 DOA: 09/30/2017  PCP: [name], [name], [name]  Admit date: 09/30/2017 Discharge date: 10/09/2017  Admitted From: ALF Disposition:  Alf  Recommendations for Outpatient Follow-up:  1. Follow up with PCP in 1-2 weeks 2. Please obtain BMP/CBC in one week 3. Please follow up WITH PALLIATIVE CARE as recommended.  4. Please follow up with SLP at the facility.   Home Health:yes Equipment/Devices: oxygen.   Discharge Condition:guarded.  CODE STATUS:DNR. Diet recommendation: Dysphagia 1 diet.   Brief/Interim Summary: Brooke Savannahvelyn R Milleris a 83 y.o.femalewith known HTN, dementia, presented via EMS [facility] with main concern of ongoing fevers, exertional dyspnea, productive cough of clear sputum. She was admitted for health care associated pneumonia.  Despite 7 days of treatment patient continues to decline, require up to 4-5 L of nasal cannula oxygen to keep sats greater than 90%.  She failed swallow evaluation and currently n.p.o. except for meds.  Palliative care consult requested for goals of care meeting.  She received 6 days of IV cefepime followed by  3 days of Unasyn.    Discharge Diagnoses:  Principal Problem:   HCAP (healthcare-associated pneumonia) Active Problems:   Hypoxia   Dementia with behavioral disturbance   ILD (interstitial lung disease) (HCC)   Atrial flutter (HCC)   Acute respiratory failure with hypoxia (HCC)  Acute respiratory failure with hypoxia secondary to right lower lobe pneumonia/healthcare associated pneumonia/ aspiration pneumonia.  Started on vancomycin and cefepime later on vancomycin discontinued.  Completed 5 days of cefepime and changed to unasyn.  Respiratory culture showed Moraxella.  Urine for streptococcal pneumonia antigen and Legionella are negative. Blood cultures have been negative so far. Respiratory virus panel positive for rhinovirus. Since  patient continues to have fevers, cough and requiring about 6 L of nasal cannula oxygen, we have repeated chest x-ray, showed worsening of the pneumonia, possibly aspiration.  Swallow evaluation recommended n.p.o. except for meds.  Patient's more awake alert today and repeat swallow evaluation, dysphagia 1 diet. Continue with duo nebs, respiratory consult for pulmonary hygiene. Patient completed about 9 days of IV antibiotics.  Hypertension Appears to be well controlled.  Atrial fibrillation with RVR Rate better with cardizem. WE COULD not given her capsule as she has trouble swallowing, so we chaged the 180 mg tab to 60 mg tab three times a day.   Functional quadriplegia patient is bedbound and wheelchair-bound. No changes.    Abnormal UA Urine cultures show multiple bacteria. Patient currently asymptomatic 9 days of IV antibiotics.   Hypokalemia replaced, repeat in am is normal   In view of the multiple medical problems and clinically not improving, with  Worsening aspiration pneumonia, a CT head without contrast ordered to evaluate for stroke and requested palliative care consult for goals of care discussion.    As per the family the would like the patient to have comfort feeds and if she continues to aspirate and and if she clinically deteriorate then  the patient to be transitioned to full comfort care.  But if she continues to improve and does not immediately aspirate they would like her to be discharged to the ALF.    Loose bowel movements Probably from IV antibiotics/Colace and MiraLAX.  Stop Unasyn, Colace, MiraLAX and monitor. No loose diarrhea.     Discharge Instructions  Discharge Instructions    Diet - low sodium heart healthy   Complete by:  As directed    Discharge instructions  Complete by:  As directed    Please follow up with palliative care services as recommended.     Allergies as of 10/09/2017   No Known Allergies     Medication List     STOP taking these medications   furosemide 20 MG tablet Commonly known as:  LASIX   nitrofurantoin (macrocrystal-monohydrate) 100 MG capsule Commonly known as:  MACROBID     TAKE these medications   acetaminophen 500 MG tablet Commonly known as:  TYLENOL Take 2 tablets (1,000 mg total) by mouth 3 (three) times daily. What changed:    how much to take  when to take this  reasons to take this   ALPRAZolam 0.5 MG tablet Commonly known as:  XANAX Take 1 tablet (0.5 mg total) by mouth every 8 (eight) hours as needed for anxiety. What changed:  when to take this   aspirin 325 MG tablet Take 1 tablet (325 mg total) by mouth daily.   benzonatate 100 MG capsule Commonly known as:  TESSALON Take 100 mg by mouth daily.   diltiazem 60 MG tablet Commonly known as:  CARDIZEM Take 1 tablet (60 mg total) by mouth every 8 (eight) hours.   DSS 100 MG Caps Take 100 mg by mouth 2 (two) times daily. What changed:  when to take this   FLUZONE HIGH-DOSE 0.5 ML injection Generic drug:  Influenza vac split quadrivalent PF Inject 0.5 mLs into the skin once. 07/14/17   guaiFENesin 600 MG 12 hr tablet Commonly known as:  MUCINEX Take 600 mg by mouth 2 (two) times daily. What changed:  Another medication with the same name was removed. Continue taking this medication, and follow the directions you see here.   hydrALAZINE 50 MG tablet Commonly known as:  APRESOLINE Take 1 tablet (50 mg total) by mouth every 6 (six) hours.   ipratropium 0.02 % nebulizer solution Commonly known as:  ATROVENT Take 2.5 mLs (0.5 mg total) by nebulization every 4 (four) hours as needed for wheezing or shortness of breath (if refractory to Xopenex).   levalbuterol 0.63 MG/3ML nebulizer solution Commonly known as:  XOPENEX Take 3 mLs (0.63 mg total) by nebulization 3 (three) times daily. What changed:    when to take this  reasons to take this   loperamide 2 MG capsule Commonly known as:   IMODIUM Take 2 mg by mouth as needed for diarrhea or loose stools.   magnesium hydroxide 400 MG/5ML suspension Commonly known as:  MILK OF MAGNESIA Take 30 mLs by mouth daily as needed for mild constipation.   metoprolol tartrate 100 MG tablet Commonly known as:  LOPRESSOR Take 1 tablet (100 mg total) by mouth 2 (two) times daily. What changed:    medication strength  how much to take   MINTOX 200-200-20 MG/5ML suspension Generic drug:  alum & mag hydroxide-simeth Take 30 mLs by mouth every 6 (six) hours as needed for indigestion or heartburn.   omeprazole 20 MG capsule Commonly known as:  PRILOSEC Take 20 mg by mouth daily.   PARoxetine 10 MG tablet Commonly known as:  PAXIL Take 1 tablet (10 mg total) by mouth daily.   TRIPLE ANTIBIOTIC [phone] Oint Apply 1 application topically daily as needed (skin tears/abrasions).   UNABLE TO FIND Take 1 each by mouth 3 (three) times daily. Systems developer  (From admission, onward)        Start  Ordered   10/09/17 1624  For home use only DME oxygen  Once    Question Answer Comment  Mode or (Route) Nasal cannula   Liters per Minute 3   Frequency Continuous (stationary and portable oxygen unit needed)   Oxygen conserving device Yes   Oxygen delivery system Gas      10/09/17 1624     Follow-up Information    Elizabeth Palau, [name]. Schedule an appointment as soon as possible for a visit in 1 week(s).   Specialty:  Nurse Practitioner Contact information: [address] [place] [address] [phone]        [facility] - Dme Follow up.   Why:  Oxygen supplier Contact information: [address] [place] [address] [phone]        [facility], [facility]-Home Follow up.   Specialty:  Home Health Services Why:  RN and Physical therapist will contact you to set up first appointments. Contact information: [address] [place] [address] [phone]          No Known Allergies  Consultations:  Palliative care.    Procedures/Studies: Dg Chest 2 View  Result Date: 09/30/2017 CLINICAL DATA:  Suspected sepsis. EXAM: CHEST  2 VIEW COMPARISON:  05/05/2016 FINDINGS: The cardiomediastinal silhouette is unchanged allowing for rightward patient rotation. Chronic interstitial coarsening is unchanged. There are new patchy airspace opacities in the lateral right mid lung and right lung base. No sizable pleural effusion or pneumothorax is identified. No acute osseous abnormality is seen. IMPRESSION: 1. Patchy right lung opacities concerning for pneumonia. 2. Chronic interstitial lung disease. Electronically Signed   By: Sebastian Ache M.D.   On: 09/30/2017 16:04   Ct Head Wo Contrast  Result Date: 10/06/2017 CLINICAL DATA:  Altered level of consciousness. Hypertension and dementia. Fever. EXAM: CT HEAD WITHOUT CONTRAST TECHNIQUE: Contiguous axial images were obtained from the base of the skull through the vertex without intravenous contrast. COMPARISON:  01/17/2017 FINDINGS: Brain: Chronic generalized atrophy particularly affecting the temporal lobes. No focal brainstem or cerebellar infarction. Old infarction in the right PCA territory affecting the right occipital lobe. Chronic small-vessel ischemic changes affecting the cerebral hemispheric white matter. No sign of acute infarction, mass lesion, hemorrhage, hydrocephalus or extra-axial collection. Vascular: There is atherosclerotic calcification of the major vessels at the base of the brain. Skull: Negative Sinuses/Orbits: Inflammatory changes of the paranasal sinuses. Other: None IMPRESSION: No acute intracranial finding. Generalized atrophy, temporal lobe predominant. Chronic small-vessel ischemic changes. Old right PCA territory infarction, which was acute on 03/11/2016. Sinusitis. Electronically Signed   By: Paulina Fusi M.D.   On: 10/06/2017 16:34   Dg  Chest Port 1 View  Result Date: 10/05/2017 CLINICAL DATA:  Fever over the past several days with weakness and lethargy. History of COPD. EXAM: PORTABLE CHEST 1 VIEW COMPARISON:  PA and lateral chest 09/30/2017. Single-view of the chest 05/15/2016. FINDINGS: Airspace disease in the right mid and lower lung zones has worsened since the prior examination. There is also new airspace disease in the medial aspect of the left lung base. No pneumothorax. There is likely a small right pleural effusion. Heart size is upper normal. IMPRESSION: Bibasilar airspace disease is worse on the right and has progressed since the most recent examination most consistent with worsened pneumonia. Electronically Signed   By: Drusilla Kanner M.D.   On: 10/05/2017 10:08      Subjective: No new complaints.  Discharge Exam: Vitals:  10/09/17 1235 10/09/17 1451  BP: (!) 134/52   Pulse: 60   Resp: 18   Temp: 98 F (36.7 C)   SpO2: 96% 95%   Vitals:   10/09/17 1040 10/09/17 1230 10/09/17 1235 10/09/17 1451  BP: (!) 150/56  (!) 134/52   Pulse: 61  60   Resp:   18   Temp:   98 F (36.7 C)   TempSrc:   Oral   SpO2:  90% 96% 95%  Weight:      Height:        General: Pt is alert, awake, not in acute distress Cardiovascular: RRR, S1/S2 +, no rubs, no gallops Respiratory: CTA bilaterally, no wheezing, no rhonchi Abdominal: Soft, NT, ND, bowel sounds + Extremities: no edema, no cyanosis    The results of significant diagnostics from this hospitalization (including imaging, microbiology, ancillary and laboratory) are listed below for reference.     Microbiology: Recent Results (from the past 240 hour(s))  Culture, blood (Routine x 2)     Status: None   Collection Time: 09/30/17  2:00 PM  Result Value Ref Range Status   Specimen Description BLOOD RIGHT FOREARM  Final   Special Requests   Final    BOTTLES DRAWN AEROBIC AND ANAEROBIC Blood Culture results may not be optimal due to an inadequate volume of  blood received in culture bottles   Culture   Final    NO GROWTH 5 DAYS Performed at [facility] Lab, [address] [place], [place] [address]    Report Status 10/05/2017 FINAL  Final  Culture, blood (Routine x 2)     Status: None   Collection Time: 09/30/17  2:05 PM  Result Value Ref Range Status   Specimen Description BLOOD LEFT ANTECUBITAL  Final   Special Requests   Final    BOTTLES DRAWN AEROBIC AND ANAEROBIC Blood Culture adequate volume   Culture   Final    NO GROWTH 5 DAYS Performed at [facility], [address], [place], [place] [address]    Report Status 10/05/2017 FINAL  Final  Urine culture     Status: Abnormal   Collection Time: 09/30/17  5:30 PM  Result Value Ref Range Status   Specimen Description URINE, RANDOM  Final   Special Requests NONE  Final   Culture MULTIPLE SPECIES PRESENT, SUGGEST RECOLLECTION (A)  Final   Report Status 10/02/2017 FINAL  Final  Culture, sputum-assessment     Status: None   Collection Time: 09/30/17  7:05 PM  Result Value Ref Range Status   Specimen Description SPUTUM  Final   Special Requests NONE  Final   Sputum evaluation THIS SPECIMEN IS ACCEPTABLE FOR SPUTUM CULTURE  Final   Report Status 09/30/2017 FINAL  Final  Culture, respiratory (NON-Expectorated)     Status: None   Collection Time: 09/30/17  7:05 PM  Result Value Ref Range Status   Specimen Description SPUTUM  Final   Special Requests NONE Reflexed from [id number]  Final   Gram Stain   Final    ABUNDANT WBC PRESENT, PREDOMINANTLY PMN FEW GRAM POSITIVE COCCI IN PAIRS    Culture   Final    RARE MORAXELLA CATARRHALIS(BRANHAMELLA) BETA LACTAMASE POSITIVE Performed at [facility], [address], [place], [place] [address]    Report Status 10/03/2017 FINAL  Final  Respiratory Panel by PCR     Status: Abnormal   Collection Time: 09/30/17  7:21 PM  Result Value Ref Range Status  Adenovirus NOT DETECTED NOT DETECTED Final   Coronavirus 229E NOT DETECTED NOT  DETECTED Final   Coronavirus HKU1 NOT DETECTED NOT DETECTED Final   Coronavirus NL63 NOT DETECTED NOT DETECTED Final   Coronavirus OC43 NOT DETECTED NOT DETECTED Final   Metapneumovirus NOT DETECTED NOT DETECTED Final   Rhinovirus / Enterovirus DETECTED (A) NOT DETECTED Final   Influenza A NOT DETECTED NOT DETECTED Final   Influenza B NOT DETECTED NOT DETECTED Final   Parainfluenza Virus 1 NOT DETECTED NOT DETECTED Final   Parainfluenza Virus 2 NOT DETECTED NOT DETECTED Final   Parainfluenza Virus 3 NOT DETECTED NOT DETECTED Final   Parainfluenza Virus 4 NOT DETECTED NOT DETECTED Final   Respiratory Syncytial Virus NOT DETECTED NOT DETECTED Final   Bordetella pertussis NOT DETECTED NOT DETECTED Final   Chlamydophila pneumoniae NOT DETECTED NOT DETECTED Final   Mycoplasma pneumoniae NOT DETECTED NOT DETECTED Final    Comment: Performed at [facility], [address] [place], [place] [address]  MRSA PCR Screening     Status: None   Collection Time: 09/30/17  7:36 PM  Result Value Ref Range Status   MRSA by PCR NEGATIVE NEGATIVE Final    Comment:        The GeneXpert MRSA Assay (FDA approved for NASAL specimens only), is one component of a comprehensive MRSA colonization surveillance program. It is not intended to diagnose MRSA infection nor to guide or monitor treatment for MRSA infections.      Labs: BNP (last 3 results) No results for input(s): BNP in the last 8760 hours. Basic Metabolic Panel: Recent Labs  Lab 10/04/17 0318 10/05/17 0300 10/06/17 0311 10/07/17 0254 10/08/17 0305  NA 136 139 135 137 136  K 3.1* 3.2* 3.1* 3.1* 3.9  CL 101 103 100* 98* 99*  CO2 27 28 28 30 29   GLUCOSE 134* 127* 127* 115* 115*  BUN 5* <5* <5* <5* <5*  CREATININE 0.60 0.57 0.54 0.54 0.57  CALCIUM 8.3* 8.4* 8.2* 8.1* 8.2*   Liver Function Tests: No results for input(s): AST, ALT, ALKPHOS, BILITOT, PROT, ALBUMIN in the last 168 hours. No results for input(s): LIPASE, AMYLASE  in the last 168 hours. No results for input(s): AMMONIA in the last 168 hours. CBC: Recent Labs  Lab 10/04/17 0318 10/05/17 0300 10/06/17 0311 10/07/17 0254 10/08/17 0305  WBC 7.7 6.5 7.6 8.5 7.5  HGB 11.9* 12.6 12.5 12.7 12.5  HCT 37.2 39.7 39.0 39.2 39.7  MCV 93.2 93.4 93.1 92.9 94.1  PLT 303 339 364 400 399   Cardiac Enzymes: No results for input(s): CKTOTAL, CKMB, CKMBINDEX, TROPONINI in the last 168 hours. BNP: Invalid input(s): POCBNP CBG: No results for input(s): GLUCAP in the last 168 hours. D-Dimer No results for input(s): DDIMER in the last 72 hours. Hgb A1c No results for input(s): HGBA1C in the last 72 hours. Lipid Profile No results for input(s): CHOL, HDL, LDLCALC, TRIG, CHOLHDL, LDLDIRECT in the last 72 hours. Thyroid function studies No results for input(s): TSH, T4TOTAL, T3FREE, THYROIDAB in the last 72 hours.  Invalid input(s): FREET3 Anemia work up No results for input(s): VITAMINB12, FOLATE, FERRITIN, TIBC, IRON, RETICCTPCT in the last 72 hours. Urinalysis    Component Value Date/Time   COLORURINE AMBER (A) 09/30/2017 1730   APPEARANCEUR CLOUDY (A) 09/30/2017 1730   LABSPEC 1.032 (H) 09/30/2017 1730   PHURINE 5.0 09/30/2017 1730   GLUCOSEU NEGATIVE 09/30/2017 1730   HGBUR NEGATIVE 09/30/2017 1730   BILIRUBINUR SMALL (A) 09/30/2017  1730   KETONESUR NEGATIVE 09/30/2017 1730   PROTEINUR 30 (A) 09/30/2017 1730   UROBILINOGEN 1.0 11/10/2013 2130   NITRITE NEGATIVE 09/30/2017 1730   LEUKOCYTESUR LARGE (A) 09/30/2017 1730   Sepsis Labs Invalid input(s): PROCALCITONIN,  WBC,  LACTICIDVEN Microbiology Recent Results (from the past 240 hour(s))  Culture, blood (Routine x 2)     Status: None   Collection Time: 09/30/17  2:00 PM  Result Value Ref Range Status   Specimen Description BLOOD RIGHT FOREARM  Final   Special Requests   Final    BOTTLES DRAWN AEROBIC AND ANAEROBIC Blood Culture results may not be optimal due to an inadequate volume of blood  received in culture bottles   Culture   Final    NO GROWTH 5 DAYS Performed at [facility], [address], [place], [place] [address]    Report Status 10/05/2017 FINAL  Final  Culture, blood (Routine x 2)     Status: None   Collection Time: 09/30/17  2:05 PM  Result Value Ref Range Status   Specimen Description BLOOD LEFT ANTECUBITAL  Final   Special Requests   Final    BOTTLES DRAWN AEROBIC AND ANAEROBIC Blood Culture adequate volume   Culture   Final    NO GROWTH 5 DAYS Performed at [facility], [address] [place], [place] [address]    Report Status 10/05/2017 FINAL  Final  Urine culture     Status: Abnormal   Collection Time: 09/30/17  5:30 PM  Result Value Ref Range Status   Specimen Description URINE, RANDOM  Final   Special Requests NONE  Final   Culture MULTIPLE SPECIES PRESENT, SUGGEST RECOLLECTION (A)  Final   Report Status 10/02/2017 FINAL  Final  Culture, sputum-assessment     Status: None   Collection Time: 09/30/17  7:05 PM  Result Value Ref Range Status   Specimen Description SPUTUM  Final   Special Requests NONE  Final   Sputum evaluation THIS SPECIMEN IS ACCEPTABLE FOR SPUTUM CULTURE  Final   Report Status 09/30/2017 FINAL  Final  Culture, respiratory (NON-Expectorated)     Status: None   Collection Time: 09/30/17  7:05 PM  Result Value Ref Range Status   Specimen Description SPUTUM  Final   Special Requests NONE Reflexed from [id number]  Final   Gram Stain   Final    ABUNDANT WBC PRESENT, PREDOMINANTLY PMN FEW GRAM POSITIVE COCCI IN PAIRS    Culture   Final    RARE MORAXELLA CATARRHALIS(BRANHAMELLA) BETA LACTAMASE POSITIVE Performed at [facility], [address], [place], [place] [address]    Report Status 10/03/2017 FINAL  Final  Respiratory Panel by PCR     Status: Abnormal   Collection Time: 09/30/17  7:21 PM  Result Value Ref Range Status   Adenovirus NOT DETECTED NOT DETECTED Final   Coronavirus 229E NOT DETECTED NOT  DETECTED Final   Coronavirus HKU1 NOT DETECTED NOT DETECTED Final   Coronavirus NL63 NOT DETECTED NOT DETECTED Final   Coronavirus OC43 NOT DETECTED NOT DETECTED Final   Metapneumovirus NOT DETECTED NOT DETECTED Final   Rhinovirus / Enterovirus DETECTED (A) NOT DETECTED Final   Influenza A NOT DETECTED NOT DETECTED Final   Influenza B NOT DETECTED NOT DETECTED Final   Parainfluenza Virus 1 NOT DETECTED NOT DETECTED Final   Parainfluenza Virus 2 NOT DETECTED NOT DETECTED Final   Parainfluenza Virus 3 NOT DETECTED NOT DETECTED Final   Parainfluenza Virus  4 NOT DETECTED NOT DETECTED Final   Respiratory Syncytial Virus NOT DETECTED NOT DETECTED Final   Bordetella pertussis NOT DETECTED NOT DETECTED Final   Chlamydophila pneumoniae NOT DETECTED NOT DETECTED Final   Mycoplasma pneumoniae NOT DETECTED NOT DETECTED Final    Comment: Performed at [facility], [address] [place] [address], [place], [place] [address]  MRSA PCR Screening     Status: None   Collection Time: 09/30/17  7:36 PM  Result Value Ref Range Status   MRSA by PCR NEGATIVE NEGATIVE Final    Comment:        The GeneXpert MRSA Assay (FDA approved for NASAL specimens only), is one component of a comprehensive MRSA colonization surveillance program. It is not intended to diagnose MRSA infection nor to guide or monitor treatment for MRSA infections.      Time coordinating discharge: Over 30 minutes  SIGNED:   Kathlen Mody, MD  [facility] 10/09/2017, 7:14 PM Pager   If 7PM-7AM, please contact night-coverage [email or web address] Password [email or web address]

## 2017-10-09 NOTE — Progress Notes (Signed)
Per nurse Haywood LassoLynette at [facility], oxygen has been delivered and set up correctly for patient. Son at bedside and made aware. [facility] has been called. SW unable to be reached to fax updated MAR to facility. [facility] OK for this RN to fax. Test paper was faxed and [facility] was called- Materials engineerJocelyn secretary verbalized that test fax was received. Updated MAR and discharge summary was then faxed. [facility] verified they received this information also. Patient alert, VSS, awaiting transport.
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
## 2017-10-09 NOTE — Progress Notes (Addendum)
SATURATION QUALIFICATIONS:   Patient Saturations on Room Air at Rest = 86-87 %  Patient Saturations on Room Air while Ambulating = pt is non ambulatory  Patient Saturations on 2 Liters of oxygen while Ambulating = 95 %  Please briefly explain why patient needs home oxygen: Pt oxygen saturation assessed at rest without oxygen. Sat is now 86-87%.

## 2017-10-09 NOTE — Clinical Social Work Note (Signed)
Patient will discharge this evening back to [facility], transported by ambulance with 2 liters oxygen. Son [name] contacted and advised that his mom will discharge this evening once ALF gets oxygen. Nurse informed that ambulance form with phone number for transport on front of chart. CSW signing off.  Genelle BalVanessa Melek Pownall, MSW, LCSW Licensed Clinical Social Worker Clinical Social Work Department [facility] [phone]

## 2017-10-10 NOTE — Progress Notes (Signed)
Patient discharged via [facility]. Son [name] called and made aware. Pt's pillow sent with patient.

## 2017-10-12 DIAGNOSIS — Z9981 Dependence on supplemental oxygen: Secondary | ICD-10-CM | POA: Diagnosis not present

## 2017-10-12 DIAGNOSIS — R531 Weakness: Secondary | ICD-10-CM | POA: Diagnosis not present

## 2017-10-12 DIAGNOSIS — E8881 Metabolic syndrome: Secondary | ICD-10-CM | POA: Diagnosis not present

## 2017-10-12 DIAGNOSIS — J188 Other pneumonia, unspecified organism: Secondary | ICD-10-CM | POA: Diagnosis not present

## 2017-10-12 DIAGNOSIS — I10 Essential (primary) hypertension: Secondary | ICD-10-CM | POA: Diagnosis not present

## 2017-10-12 DIAGNOSIS — F39 Unspecified mood [affective] disorder: Secondary | ICD-10-CM | POA: Diagnosis not present

## 2017-10-12 DIAGNOSIS — J449 Chronic obstructive pulmonary disease, unspecified: Secondary | ICD-10-CM | POA: Diagnosis not present

## 2017-10-12 DIAGNOSIS — G308 Other Alzheimer's disease: Secondary | ICD-10-CM | POA: Diagnosis not present

## 2017-10-13 DIAGNOSIS — I4891 Unspecified atrial fibrillation: Secondary | ICD-10-CM | POA: Diagnosis not present

## 2017-10-13 DIAGNOSIS — I10 Essential (primary) hypertension: Secondary | ICD-10-CM | POA: Diagnosis not present

## 2017-10-13 DIAGNOSIS — J45909 Unspecified asthma, uncomplicated: Secondary | ICD-10-CM | POA: Diagnosis not present

## 2017-10-13 DIAGNOSIS — J9601 Acute respiratory failure with hypoxia: Secondary | ICD-10-CM | POA: Diagnosis not present

## 2017-10-13 DIAGNOSIS — F0390 Unspecified dementia without behavioral disturbance: Secondary | ICD-10-CM | POA: Diagnosis not present

## 2017-10-13 DIAGNOSIS — J181 Lobar pneumonia, unspecified organism: Secondary | ICD-10-CM | POA: Diagnosis not present

## 2017-10-13 DIAGNOSIS — Z9981 Dependence on supplemental oxygen: Secondary | ICD-10-CM | POA: Diagnosis not present

## 2017-10-13 DIAGNOSIS — Z7982 Long term (current) use of aspirin: Secondary | ICD-10-CM | POA: Diagnosis not present

## 2017-10-13 DIAGNOSIS — J440 Chronic obstructive pulmonary disease with acute lower respiratory infection: Secondary | ICD-10-CM | POA: Diagnosis not present

## 2017-10-18 DIAGNOSIS — J181 Lobar pneumonia, unspecified organism: Secondary | ICD-10-CM | POA: Diagnosis not present

## 2017-10-18 DIAGNOSIS — J440 Chronic obstructive pulmonary disease with acute lower respiratory infection: Secondary | ICD-10-CM | POA: Diagnosis not present

## 2017-10-18 DIAGNOSIS — I4891 Unspecified atrial fibrillation: Secondary | ICD-10-CM | POA: Diagnosis not present

## 2017-10-18 DIAGNOSIS — I10 Essential (primary) hypertension: Secondary | ICD-10-CM | POA: Diagnosis not present

## 2017-10-18 DIAGNOSIS — J9601 Acute respiratory failure with hypoxia: Secondary | ICD-10-CM | POA: Diagnosis not present

## 2017-10-18 DIAGNOSIS — F0390 Unspecified dementia without behavioral disturbance: Secondary | ICD-10-CM | POA: Diagnosis not present

## 2017-10-19 DIAGNOSIS — J181 Lobar pneumonia, unspecified organism: Secondary | ICD-10-CM | POA: Diagnosis not present

## 2017-10-19 DIAGNOSIS — F39 Unspecified mood [affective] disorder: Secondary | ICD-10-CM | POA: Diagnosis not present

## 2017-10-19 DIAGNOSIS — F0390 Unspecified dementia without behavioral disturbance: Secondary | ICD-10-CM | POA: Diagnosis not present

## 2017-10-19 DIAGNOSIS — I4891 Unspecified atrial fibrillation: Secondary | ICD-10-CM | POA: Diagnosis not present

## 2017-10-19 DIAGNOSIS — J440 Chronic obstructive pulmonary disease with acute lower respiratory infection: Secondary | ICD-10-CM | POA: Diagnosis not present

## 2017-10-19 DIAGNOSIS — J449 Chronic obstructive pulmonary disease, unspecified: Secondary | ICD-10-CM | POA: Diagnosis not present

## 2017-10-19 DIAGNOSIS — J9601 Acute respiratory failure with hypoxia: Secondary | ICD-10-CM | POA: Diagnosis not present

## 2017-10-19 DIAGNOSIS — J188 Other pneumonia, unspecified organism: Secondary | ICD-10-CM | POA: Diagnosis not present

## 2017-10-19 DIAGNOSIS — I10 Essential (primary) hypertension: Secondary | ICD-10-CM | POA: Diagnosis not present

## 2017-10-20 DIAGNOSIS — I10 Essential (primary) hypertension: Secondary | ICD-10-CM | POA: Diagnosis not present

## 2017-10-20 DIAGNOSIS — D649 Anemia, unspecified: Secondary | ICD-10-CM | POA: Diagnosis not present

## 2017-10-25 ENCOUNTER — Emergency Department (HOSPITAL_COMMUNITY)
Admission: EM | Admit: 2017-10-25 | Discharge: 2017-10-25 | Disposition: A | Discharge status: Home / Self Care | Payer: Medicare Other | Attending: Emergency Medicine | Admitting: Emergency Medicine

## 2017-10-25 ENCOUNTER — Other Ambulatory Visit: Payer: Self-pay

## 2017-10-25 DIAGNOSIS — Z23 Encounter for immunization: Secondary | ICD-10-CM | POA: Diagnosis not present

## 2017-10-25 DIAGNOSIS — Y93E1 Activity, personal bathing and showering: Secondary | ICD-10-CM | POA: Diagnosis not present

## 2017-10-25 DIAGNOSIS — Z79899 Other long term (current) drug therapy: Secondary | ICD-10-CM | POA: Diagnosis not present

## 2017-10-25 DIAGNOSIS — Y999 Unspecified external cause status: Secondary | ICD-10-CM | POA: Diagnosis not present

## 2017-10-25 DIAGNOSIS — S6992XA Unspecified injury of left wrist, hand and finger(s), initial encounter: Secondary | ICD-10-CM | POA: Diagnosis not present

## 2017-10-25 DIAGNOSIS — G309 Alzheimer's disease, unspecified: Secondary | ICD-10-CM | POA: Insufficient documentation

## 2017-10-25 DIAGNOSIS — Y92002 Bathroom of unspecified non-institutional (private) residence single-family (private) house as the place of occurrence of the external cause: Secondary | ICD-10-CM | POA: Diagnosis not present

## 2017-10-25 DIAGNOSIS — J449 Chronic obstructive pulmonary disease, unspecified: Secondary | ICD-10-CM | POA: Insufficient documentation

## 2017-10-25 DIAGNOSIS — G8911 Acute pain due to trauma: Secondary | ICD-10-CM | POA: Diagnosis not present

## 2017-10-25 DIAGNOSIS — F0280 Dementia in other diseases classified elsewhere without behavioral disturbance: Secondary | ICD-10-CM | POA: Insufficient documentation

## 2017-10-25 DIAGNOSIS — T148XXA Other injury of unspecified body region, initial encounter: Secondary | ICD-10-CM | POA: Diagnosis not present

## 2017-10-25 DIAGNOSIS — Z7982 Long term (current) use of aspirin: Secondary | ICD-10-CM | POA: Diagnosis not present

## 2017-10-25 DIAGNOSIS — J45909 Unspecified asthma, uncomplicated: Secondary | ICD-10-CM | POA: Insufficient documentation

## 2017-10-25 DIAGNOSIS — S61412A Laceration without foreign body of left hand, initial encounter: Secondary | ICD-10-CM | POA: Insufficient documentation

## 2017-10-25 DIAGNOSIS — W2209XA Striking against other stationary object, initial encounter: Secondary | ICD-10-CM | POA: Diagnosis not present

## 2017-10-25 MED ORDER — TETANUS-DIPHTH-ACELL PERTUSSIS 5-2.5-18.5 LF-MCG/0.5 IM SUSP
0.5000 mL | Freq: Once | INTRAMUSCULAR | Status: AC
  Administered 2017-10-25: 0.5 mL via INTRAMUSCULAR
  Filled 2017-10-25: qty 0.5
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
## 2017-10-25 NOTE — Discharge Instructions (Signed)
Please read instructions below.  Keep your wound clean and covered. The steri-strips will fall off on their own. You can apply antibiotic ointment once they do, such as bacitracin or neosporin.  Follow up with your primary care for wound recheck in 3 days.  Return to the ER for fever, pus draining from wound, redness, or new or worsening symptoms.

## 2017-10-25 NOTE — ED Triage Notes (Signed)
PT was brought by [facility] for a right hand laceration. Pt has history of Alzheimer and is on Cardizem with a pulse of 49. Pt presents with no complaints and with a right hand bandage.

## 2017-10-25 NOTE — ED Provider Notes (Signed)
[facility]-EMERGENCY DEPT Provider Note   CSN: [id number] Arrival date & time: 10/25/17  1934     History   Chief Complaint Chief Complaint  Patient presents with  . Hand Injury    HPI Brooke Phelps is a 83 y.o. female with past medical history of Alzheimer's dementia, interstitial lung disease, atrial fibrillation, presenting to the ED with worsening skin tear to left hand since Friday.  Per family, patient bumped her hand in the shower on Friday and there was a small skin tear, however the wound opened further since then.  No fall or head trauma, denies pus or increased redness.  Family reports she is at her mental baseline.  She has not been complaining of pain in the hand.  She is not on anticoagulation.  Tetanus status unknown.  The history is provided by a relative. The history is limited by the condition of the patient.    Past Medical History:  Diagnosis Date  . Asthma   . Atrial fibrillation (HCC)   . COPD (chronic obstructive pulmonary disease) (HCC)   . Dementia     Patient Active Problem List   Diagnosis Date Noted  . HCAP (healthcare-associated pneumonia) 09/30/2017  . Acute encephalopathy 03/12/2016  . UTI (lower urinary tract infection) 03/12/2016  . Acute respiratory failure with hypoxia (HCC) 03/12/2016  . Altered mental status 03/11/2016  . Atrial flutter (HCC) 11/12/2013  . Hypoxia 11/11/2013  . Dementia with behavioral disturbance 11/11/2013  . ILD (interstitial lung disease) (HCC) 11/11/2013  . Bimalleolar ankle fracture 11/10/2013    Past Surgical History:  Procedure Laterality Date  . ORIF ANKLE FRACTURE Left 11/11/2013   Procedure: OPEN REDUCTION INTERNAL FIXATION (ORIF) ANKLE FRACTURE;  Surgeon: Nadara MustardMarcus V Duda, MD;  Location: [facility] OR;  Service: Orthopedics;  Laterality: Left;    OB History    No data available       Home Medications    Prior to Admission medications   Medication Sig Start Date End Date Taking?  Authorizing Provider  acetaminophen (TYLENOL) 500 MG tablet Take 2 tablets (1,000 mg total) by mouth 3 (three) times daily. Patient taking differently: Take 500 mg by mouth every 4 (four) hours as needed for mild pain.  11/14/13   Ghimire, Werner LeanShanker M, MD  ALPRAZolam Prudy Feeler(XANAX) 0.5 MG tablet Take 1 tablet (0.5 mg total) by mouth every 8 (eight) hours as needed for anxiety. Patient taking differently: Take 0.5 mg by mouth 2 (two) times daily.  11/14/13   Ghimire, Werner LeanShanker M, MD  alum & mag hydroxide-simeth (MINTOX) 200-200-20 MG/5ML suspension Take 30 mLs by mouth every 6 (six) hours as needed for indigestion or heartburn.    [provider]  aspirin 325 MG tablet Take 1 tablet (325 mg total) by mouth daily. 11/14/13   Ghimire, Werner LeanShanker M, MD  benzonatate (TESSALON) 100 MG capsule Take 100 mg by mouth daily.    [provider]  diltiazem (CARDIZEM) 60 MG tablet Take 1 tablet (60 mg total) by mouth every 8 (eight) hours. 10/09/17   Kathlen ModyAkula, Vijaya, MD  docusate sodium 100 MG CAPS Take 100 mg by mouth 2 (two) times daily. Patient taking differently: Take 100 mg by mouth daily.  11/14/13   Ghimire, Werner LeanShanker M, MD  FLUZONE HIGH-DOSE 0.5 ML injection Inject 0.5 mLs into the skin once. 07/14/17 07/14/17   [provider]  guaiFENesin (MUCINEX) 600 MG 12 hr tablet Take 600 mg by mouth 2 (two) times daily.    [provider]  hydrALAZINE (APRESOLINE) 50 MG tablet Take 1 tablet (50 mg total) by mouth every 6 (six) hours. 10/09/17   Kathlen Mody, MD  ipratropium (ATROVENT) 0.02 % nebulizer solution Take 2.5 mLs (0.5 mg total) by nebulization every 4 (four) hours as needed for wheezing or shortness of breath (if refractory to Xopenex). 10/09/17   Kathlen Mody, MD  levalbuterol (XOPENEX) 0.63 MG/3ML nebulizer solution Take 3 mLs (0.63 mg total) by nebulization 3 (three) times daily. Patient taking differently: Take 0.63 mg by nebulization every 4 (four) hours as needed for wheezing.  11/14/13    Ghimire, Werner Lean, MD  loperamide (IMODIUM) 2 MG capsule Take 2 mg by mouth as needed for diarrhea or loose stools.    [provider]  magnesium hydroxide (MILK OF MAGNESIA) 400 MG/5ML suspension Take 30 mLs by mouth daily as needed for mild constipation.    [provider]  metoprolol tartrate (LOPRESSOR) 100 MG tablet Take 1 tablet (100 mg total) by mouth 2 (two) times daily. 10/09/17   Kathlen Mody, MD  Neomycin-Bacitracin-Polymyxin (TRIPLE ANTIBIOTIC) [phone] OINT Apply 1 application topically daily as needed (skin tears/abrasions).     [provider]  omeprazole (PRILOSEC) 20 MG capsule Take 20 mg by mouth daily.    [provider]  PARoxetine (PAXIL) 10 MG tablet Take 1 tablet (10 mg total) by mouth daily. 11/14/13   Ghimire, Werner Lean, MD  UNABLE TO FIND Take 1 each by mouth 3 (three) times daily. Mighty Shakes    [provider]    Family History Family History  Problem Relation Age of Onset  . Dementia Sister   . Diabetes Mellitus II Neg Hx   . Hypertension Neg Hx     Social History Social History   Tobacco Use  . Smoking status: Never Smoker  . Smokeless tobacco: Never Used  Substance Use Topics  . Alcohol use: No  . Drug use: No     Allergies   Patient has no known allergies.   Review of Systems Review of Systems  Unable to perform ROS: Dementia  Constitutional: Negative for fever.  Skin: Positive for wound.     Physical Exam Updated Vital Signs BP (!) 163/58 (BP Location: Right Arm)   Pulse (!) 49   Temp 98.6 F (37 C) (Oral)   Resp 16   SpO2 100%   Physical Exam  Constitutional: She appears well-developed and well-nourished. No distress.  HENT:  Head: Normocephalic and atraumatic.  Eyes: Conjunctivae are normal.  Cardiovascular: Normal rate and intact distal pulses.  Pulmonary/Chest: Effort normal.  Abdominal: Soft.  Musculoskeletal:  3 cm laceration to dorsal left hand overlying the first  metacarpal.  Some surrounding ecchymosis, however no redness or purulent drainage.  Not actively bleeding.  Not grossly contaminated.  No surrounding tenderness, hand with normal range of motion.  Neurological: She is alert.  Skin: Skin is warm.  Psychiatric: She has a normal mood and affect. Her behavior is normal.  Nursing note and vitals reviewed.    ED Treatments / Results  Labs (all labs ordered are listed, but only abnormal results are displayed) Labs Reviewed - No data to display  EKG  EKG Interpretation None       Radiology No results found.  Procedures Procedures (including critical care time)  Medications Ordered in ED Medications  Tdap (BOOSTRIX) injection 0.5 mL (0.5 mLs Intramuscular Given 10/25/17 2129)     Initial Impression / Assessment and Plan / ED Course  I have reviewed the triage vital signs and the nursing notes.  Pertinent labs & imaging results that were available during my care of the patient were reviewed by me and considered in my medical decision making (see chart for details).     Patient with skin tear since Friday, that is opened further since then.  No signs of infection. Normal ROM. No other injuries noted or evident on exam.  Wound irrigated in the ED, and closed loosely with Steri-Strips to allow for drainage.  Tdap updated. Will discharge with wound care instructions and PCP follow up. Safe for discharge  Patient discussed with and seen by Dr. Rush Landmarkegeler.  Discussed results, findings, treatment and follow up. Patient advised of return precautions. Patient verbalized understanding and agreed with plan.  Final Clinical Impressions(s) / ED Diagnoses   Final diagnoses:  Laceration of left hand without foreign body, initial encounter    ED Discharge Orders    None       Medardo Hassing, [place] N, PA-C 10/25/17 2228    Tegeler, Canary Brimhristopher J, MD 10/27/17 [phone]

## 2017-10-28 DIAGNOSIS — J9601 Acute respiratory failure with hypoxia: Secondary | ICD-10-CM | POA: Diagnosis not present

## 2017-10-28 DIAGNOSIS — F0390 Unspecified dementia without behavioral disturbance: Secondary | ICD-10-CM | POA: Diagnosis not present

## 2017-10-28 DIAGNOSIS — I4891 Unspecified atrial fibrillation: Secondary | ICD-10-CM | POA: Diagnosis not present

## 2017-10-28 DIAGNOSIS — J440 Chronic obstructive pulmonary disease with acute lower respiratory infection: Secondary | ICD-10-CM | POA: Diagnosis not present

## 2017-10-28 DIAGNOSIS — J181 Lobar pneumonia, unspecified organism: Secondary | ICD-10-CM | POA: Diagnosis not present

## 2017-10-28 DIAGNOSIS — I10 Essential (primary) hypertension: Secondary | ICD-10-CM | POA: Diagnosis not present

## 2017-11-02 DIAGNOSIS — F39 Unspecified mood [affective] disorder: Secondary | ICD-10-CM | POA: Diagnosis not present

## 2017-11-02 DIAGNOSIS — I10 Essential (primary) hypertension: Secondary | ICD-10-CM | POA: Diagnosis not present

## 2017-11-02 DIAGNOSIS — G301 Alzheimer's disease with late onset: Secondary | ICD-10-CM | POA: Diagnosis not present

## 2017-11-02 DIAGNOSIS — I4891 Unspecified atrial fibrillation: Secondary | ICD-10-CM | POA: Diagnosis not present

## 2017-11-02 DIAGNOSIS — J449 Chronic obstructive pulmonary disease, unspecified: Secondary | ICD-10-CM | POA: Diagnosis not present

## 2017-11-02 DIAGNOSIS — R4181 Age-related cognitive decline: Secondary | ICD-10-CM | POA: Diagnosis not present

## 2017-11-05 DIAGNOSIS — J440 Chronic obstructive pulmonary disease with acute lower respiratory infection: Secondary | ICD-10-CM | POA: Diagnosis not present

## 2017-11-05 DIAGNOSIS — F0390 Unspecified dementia without behavioral disturbance: Secondary | ICD-10-CM | POA: Diagnosis not present

## 2017-11-05 DIAGNOSIS — J181 Lobar pneumonia, unspecified organism: Secondary | ICD-10-CM | POA: Diagnosis not present

## 2017-11-05 DIAGNOSIS — J9601 Acute respiratory failure with hypoxia: Secondary | ICD-10-CM | POA: Diagnosis not present

## 2017-11-05 DIAGNOSIS — I10 Essential (primary) hypertension: Secondary | ICD-10-CM | POA: Diagnosis not present

## 2017-11-05 DIAGNOSIS — I4891 Unspecified atrial fibrillation: Secondary | ICD-10-CM | POA: Diagnosis not present

## 2017-11-10 DIAGNOSIS — B351 Tinea unguium: Secondary | ICD-10-CM | POA: Diagnosis not present

## 2017-11-10 DIAGNOSIS — I739 Peripheral vascular disease, unspecified: Secondary | ICD-10-CM | POA: Diagnosis not present

## 2017-11-11 DIAGNOSIS — J181 Lobar pneumonia, unspecified organism: Secondary | ICD-10-CM | POA: Diagnosis not present

## 2017-11-11 DIAGNOSIS — I10 Essential (primary) hypertension: Secondary | ICD-10-CM | POA: Diagnosis not present

## 2017-11-11 DIAGNOSIS — I4891 Unspecified atrial fibrillation: Secondary | ICD-10-CM | POA: Diagnosis not present

## 2017-11-11 DIAGNOSIS — F0390 Unspecified dementia without behavioral disturbance: Secondary | ICD-10-CM | POA: Diagnosis not present

## 2017-11-11 DIAGNOSIS — J9601 Acute respiratory failure with hypoxia: Secondary | ICD-10-CM | POA: Diagnosis not present

## 2017-11-11 DIAGNOSIS — J440 Chronic obstructive pulmonary disease with acute lower respiratory infection: Secondary | ICD-10-CM | POA: Diagnosis not present

## 2017-11-16 DIAGNOSIS — J449 Chronic obstructive pulmonary disease, unspecified: Secondary | ICD-10-CM | POA: Diagnosis not present

## 2017-11-16 DIAGNOSIS — F39 Unspecified mood [affective] disorder: Secondary | ICD-10-CM | POA: Diagnosis not present

## 2017-11-16 DIAGNOSIS — J440 Chronic obstructive pulmonary disease with acute lower respiratory infection: Secondary | ICD-10-CM | POA: Diagnosis not present

## 2017-11-16 DIAGNOSIS — I10 Essential (primary) hypertension: Secondary | ICD-10-CM | POA: Diagnosis not present

## 2017-11-16 DIAGNOSIS — J181 Lobar pneumonia, unspecified organism: Secondary | ICD-10-CM | POA: Diagnosis not present

## 2017-11-16 DIAGNOSIS — J188 Other pneumonia, unspecified organism: Secondary | ICD-10-CM | POA: Diagnosis not present

## 2017-11-16 DIAGNOSIS — F0390 Unspecified dementia without behavioral disturbance: Secondary | ICD-10-CM | POA: Diagnosis not present

## 2017-11-16 DIAGNOSIS — G301 Alzheimer's disease with late onset: Secondary | ICD-10-CM | POA: Diagnosis not present

## 2017-11-16 DIAGNOSIS — J9601 Acute respiratory failure with hypoxia: Secondary | ICD-10-CM | POA: Diagnosis not present

## 2017-11-16 DIAGNOSIS — Z9181 History of falling: Secondary | ICD-10-CM | POA: Diagnosis not present

## 2017-11-16 DIAGNOSIS — I4891 Unspecified atrial fibrillation: Secondary | ICD-10-CM | POA: Diagnosis not present

## 2017-11-25 DIAGNOSIS — J181 Lobar pneumonia, unspecified organism: Secondary | ICD-10-CM | POA: Diagnosis not present

## 2017-11-25 DIAGNOSIS — J440 Chronic obstructive pulmonary disease with acute lower respiratory infection: Secondary | ICD-10-CM | POA: Diagnosis not present

## 2017-11-25 DIAGNOSIS — I4891 Unspecified atrial fibrillation: Secondary | ICD-10-CM | POA: Diagnosis not present

## 2017-11-25 DIAGNOSIS — F0390 Unspecified dementia without behavioral disturbance: Secondary | ICD-10-CM | POA: Diagnosis not present

## 2017-11-25 DIAGNOSIS — J9601 Acute respiratory failure with hypoxia: Secondary | ICD-10-CM | POA: Diagnosis not present

## 2017-11-25 DIAGNOSIS — I10 Essential (primary) hypertension: Secondary | ICD-10-CM | POA: Diagnosis not present

## 2017-11-30 DIAGNOSIS — I10 Essential (primary) hypertension: Secondary | ICD-10-CM | POA: Diagnosis not present

## 2017-11-30 DIAGNOSIS — I4891 Unspecified atrial fibrillation: Secondary | ICD-10-CM | POA: Diagnosis not present

## 2017-11-30 DIAGNOSIS — J9601 Acute respiratory failure with hypoxia: Secondary | ICD-10-CM | POA: Diagnosis not present

## 2017-11-30 DIAGNOSIS — E8881 Metabolic syndrome: Secondary | ICD-10-CM | POA: Diagnosis not present

## 2017-11-30 DIAGNOSIS — J449 Chronic obstructive pulmonary disease, unspecified: Secondary | ICD-10-CM | POA: Diagnosis not present

## 2017-11-30 DIAGNOSIS — R54 Age-related physical debility: Secondary | ICD-10-CM | POA: Diagnosis not present

## 2017-11-30 DIAGNOSIS — Z5189 Encounter for other specified aftercare: Secondary | ICD-10-CM | POA: Diagnosis not present

## 2017-11-30 DIAGNOSIS — J181 Lobar pneumonia, unspecified organism: Secondary | ICD-10-CM | POA: Diagnosis not present

## 2017-11-30 DIAGNOSIS — J1289 Other viral pneumonia: Secondary | ICD-10-CM | POA: Diagnosis not present

## 2017-11-30 DIAGNOSIS — F0390 Unspecified dementia without behavioral disturbance: Secondary | ICD-10-CM | POA: Diagnosis not present

## 2017-11-30 DIAGNOSIS — J440 Chronic obstructive pulmonary disease with acute lower respiratory infection: Secondary | ICD-10-CM | POA: Diagnosis not present

## 2017-11-30 DIAGNOSIS — F3489 Other specified persistent mood disorders: Secondary | ICD-10-CM | POA: Diagnosis not present

## 2017-11-30 DIAGNOSIS — G4719 Other hypersomnia: Secondary | ICD-10-CM | POA: Diagnosis not present

## 2017-12-09 DIAGNOSIS — J181 Lobar pneumonia, unspecified organism: Secondary | ICD-10-CM | POA: Diagnosis not present

## 2017-12-09 DIAGNOSIS — I10 Essential (primary) hypertension: Secondary | ICD-10-CM | POA: Diagnosis not present

## 2017-12-09 DIAGNOSIS — J440 Chronic obstructive pulmonary disease with acute lower respiratory infection: Secondary | ICD-10-CM | POA: Diagnosis not present

## 2017-12-09 DIAGNOSIS — F0390 Unspecified dementia without behavioral disturbance: Secondary | ICD-10-CM | POA: Diagnosis not present

## 2017-12-09 DIAGNOSIS — I4891 Unspecified atrial fibrillation: Secondary | ICD-10-CM | POA: Diagnosis not present

## 2017-12-09 DIAGNOSIS — J9601 Acute respiratory failure with hypoxia: Secondary | ICD-10-CM | POA: Diagnosis not present

## 2017-12-14 DIAGNOSIS — Z5189 Encounter for other specified aftercare: Secondary | ICD-10-CM | POA: Diagnosis not present

## 2017-12-14 DIAGNOSIS — E639 Nutritional deficiency, unspecified: Secondary | ICD-10-CM | POA: Diagnosis not present

## 2017-12-14 DIAGNOSIS — E8889 Other specified metabolic disorders: Secondary | ICD-10-CM | POA: Diagnosis not present

## 2017-12-14 DIAGNOSIS — I10 Essential (primary) hypertension: Secondary | ICD-10-CM | POA: Diagnosis not present

## 2017-12-14 DIAGNOSIS — F39 Unspecified mood [affective] disorder: Secondary | ICD-10-CM | POA: Diagnosis not present

## 2017-12-14 DIAGNOSIS — J449 Chronic obstructive pulmonary disease, unspecified: Secondary | ICD-10-CM | POA: Diagnosis not present

## 2017-12-14 DIAGNOSIS — R2689 Other abnormalities of gait and mobility: Secondary | ICD-10-CM | POA: Diagnosis not present

## 2017-12-14 DIAGNOSIS — M6281 Muscle weakness (generalized): Secondary | ICD-10-CM | POA: Diagnosis not present

## 2017-12-28 DIAGNOSIS — M79675 Pain in left toe(s): Secondary | ICD-10-CM | POA: Diagnosis not present

## 2017-12-28 DIAGNOSIS — M79674 Pain in right toe(s): Secondary | ICD-10-CM | POA: Diagnosis not present

## 2017-12-28 DIAGNOSIS — B351 Tinea unguium: Secondary | ICD-10-CM | POA: Diagnosis not present

## 2018-01-11 DIAGNOSIS — R531 Weakness: Secondary | ICD-10-CM | POA: Diagnosis not present

## 2018-01-11 DIAGNOSIS — I739 Peripheral vascular disease, unspecified: Secondary | ICD-10-CM | POA: Diagnosis not present

## 2018-01-11 DIAGNOSIS — B351 Tinea unguium: Secondary | ICD-10-CM | POA: Diagnosis not present

## 2018-03-01 DIAGNOSIS — B351 Tinea unguium: Secondary | ICD-10-CM | POA: Diagnosis not present

## 2018-03-01 DIAGNOSIS — M79675 Pain in left toe(s): Secondary | ICD-10-CM | POA: Diagnosis not present

## 2018-03-01 DIAGNOSIS — M79674 Pain in right toe(s): Secondary | ICD-10-CM | POA: Diagnosis not present

## 2018-03-08 DIAGNOSIS — I10 Essential (primary) hypertension: Secondary | ICD-10-CM | POA: Diagnosis not present

## 2018-03-08 DIAGNOSIS — J449 Chronic obstructive pulmonary disease, unspecified: Secondary | ICD-10-CM | POA: Diagnosis not present

## 2018-03-08 DIAGNOSIS — M6281 Muscle weakness (generalized): Secondary | ICD-10-CM | POA: Diagnosis not present

## 2018-03-08 DIAGNOSIS — F39 Unspecified mood [affective] disorder: Secondary | ICD-10-CM | POA: Diagnosis not present

## 2018-03-10 DIAGNOSIS — G309 Alzheimer's disease, unspecified: Secondary | ICD-10-CM | POA: Diagnosis not present

## 2018-03-10 DIAGNOSIS — F0281 Dementia in other diseases classified elsewhere with behavioral disturbance: Secondary | ICD-10-CM | POA: Diagnosis not present

## 2018-03-11 DIAGNOSIS — F0281 Dementia in other diseases classified elsewhere with behavioral disturbance: Secondary | ICD-10-CM | POA: Diagnosis not present

## 2018-03-11 DIAGNOSIS — G309 Alzheimer's disease, unspecified: Secondary | ICD-10-CM | POA: Diagnosis not present

## 2018-03-14 DIAGNOSIS — F0281 Dementia in other diseases classified elsewhere with behavioral disturbance: Secondary | ICD-10-CM | POA: Diagnosis not present

## 2018-03-14 DIAGNOSIS — G309 Alzheimer's disease, unspecified: Secondary | ICD-10-CM | POA: Diagnosis not present

## 2018-03-17 DIAGNOSIS — G309 Alzheimer's disease, unspecified: Secondary | ICD-10-CM | POA: Diagnosis not present

## 2018-03-17 DIAGNOSIS — F0281 Dementia in other diseases classified elsewhere with behavioral disturbance: Secondary | ICD-10-CM | POA: Diagnosis not present

## 2018-03-18 DIAGNOSIS — G309 Alzheimer's disease, unspecified: Secondary | ICD-10-CM | POA: Diagnosis not present

## 2018-03-18 DIAGNOSIS — F0281 Dementia in other diseases classified elsewhere with behavioral disturbance: Secondary | ICD-10-CM | POA: Diagnosis not present

## 2018-03-22 DIAGNOSIS — G309 Alzheimer's disease, unspecified: Secondary | ICD-10-CM | POA: Diagnosis not present

## 2018-03-22 DIAGNOSIS — F0281 Dementia in other diseases classified elsewhere with behavioral disturbance: Secondary | ICD-10-CM | POA: Diagnosis not present

## 2018-03-24 DIAGNOSIS — G309 Alzheimer's disease, unspecified: Secondary | ICD-10-CM | POA: Diagnosis not present

## 2018-03-24 DIAGNOSIS — F0281 Dementia in other diseases classified elsewhere with behavioral disturbance: Secondary | ICD-10-CM | POA: Diagnosis not present

## 2018-03-29 DIAGNOSIS — G309 Alzheimer's disease, unspecified: Secondary | ICD-10-CM | POA: Diagnosis not present

## 2018-03-29 DIAGNOSIS — F0281 Dementia in other diseases classified elsewhere with behavioral disturbance: Secondary | ICD-10-CM | POA: Diagnosis not present

## 2018-03-31 ENCOUNTER — Non-Acute Institutional Stay: Payer: Medicare Other | Admitting: Hospice and Palliative Medicine

## 2018-03-31 DIAGNOSIS — G309 Alzheimer's disease, unspecified: Secondary | ICD-10-CM | POA: Diagnosis not present

## 2018-03-31 DIAGNOSIS — F0281 Dementia in other diseases classified elsewhere with behavioral disturbance: Secondary | ICD-10-CM | POA: Diagnosis not present

## 2018-03-31 DIAGNOSIS — Z515 Encounter for palliative care: Secondary | ICD-10-CM | POA: Diagnosis not present

## 2018-03-31 NOTE — Progress Notes (Signed)
PALLIATIVE CARE CONSULT VISIT   PATIENT NAME: Brooke Phelps DOB: [birth year] MRN: [id number]  PRIMARY CARE PROVIDER: Florentina Jenny, MD  REFERRING PROVIDER: Florentina Jenny, MD [phone] [place], [place] [address]  RESPONSIBLE PARTY:   son   RECOMMENDATIONS and PLAN:  1.Weakness: secondary to age, chronic conditions with worsening dementia. She has gotten weaker as expected from disease trajectory. She now is eating less due to her pureed textured diet. She has been getting PT/OT ordered  by Dr. Redmond School, her PCP. She is also getting protein shakes to maintain her protein intake. She continues to reside in the memory care unit, getting assistance with adl's. I have no recommendations to make other than to continue monitoring from a palliative care perspective, and when she becomes hospice eligible, to inform family and coordinate this process.  2. Memory loss: secondary to dementia. She is approximately a 7c on FAST scale. She needs prompting to eat with set up. She can speak several words but word searches.She can stand with assistance and walks, but requires assistance. She does aspirate and requires pureed foods. She continues to maintain weight, most likely secondary to the protein shakes. She is currently getting [facility] PT/OT. Again, will discuss goals of care again with son. 3. ACP: DNR form on chart. MOST form on chart from April 2018. Have reached out to son. Will update MOST form and goals of care when we connect..    I spent 25  minutes providing this consultation,  from 10:30 am  to 10:55am. More than 50% of the time in this consultation was spent interviewing staff, reviewing records and coordinating communication.   HISTORY OF PRESENT ILLNESS:  Brooke RODRIGES is an 84 y.o. female with multiple medical problems including Afib, dementia and aspiration. Palliative Care was asked to help address symptom management and goals of care.Today's visit scheduled as a routine  visit.  CODE STATUS: DNR  PPS: 30 to weak 40% HOSPICE ELIGIBILITY/DIAGNOSIS: TBD  PAST MEDICAL HISTORY:  Past Medical History:  Diagnosis Date  . Asthma   . Atrial fibrillation (HCC)   . COPD (chronic obstructive pulmonary disease) (HCC)   . Dementia     SOCIAL HX:  Social History   Tobacco Use  . Smoking status: Never Smoker  . Smokeless tobacco: Never Used  Substance Use Topics  . Alcohol use: No    ALLERGIES: No Known Allergies   PERTINENT MEDICATIONS:  Outpatient Encounter Medications as of 03/31/2018  Medication Sig  . acetaminophen (TYLENOL) 500 MG tablet Take 2 tablets (1,000 mg total) by mouth 3 (three) times daily. (Patient taking differently: Take 500 mg by mouth every 4 (four) hours as needed for mild pain. )  . ALPRAZolam (XANAX) 0.5 MG tablet Take 1 tablet (0.5 mg total) by mouth every 8 (eight) hours as needed for anxiety. (Patient taking differently: Take 0.5 mg by mouth 2 (two) times daily. )  . alum & mag hydroxide-simeth (MINTOX) 200-200-20 MG/5ML suspension Take 30 mLs by mouth every 6 (six) hours as needed for indigestion or heartburn.  Marland Kitchen aspirin 325 MG tablet Take 1 tablet (325 mg total) by mouth daily.  . benzonatate (TESSALON) 100 MG capsule Take 100 mg by mouth daily.  Marland Kitchen diltiazem (CARDIZEM) 60 MG tablet Take 1 tablet (60 mg total) by mouth every 8 (eight) hours.  . docusate sodium 100 MG CAPS Take 100 mg by mouth 2 (two) times daily. (Patient taking differently: Take 100 mg by mouth daily. )  .  FLUZONE HIGH-DOSE 0.5 ML injection Inject 0.5 mLs into the skin once. 07/14/17  . guaiFENesin (MUCINEX) 600 MG 12 hr tablet Take 600 mg by mouth 2 (two) times daily.  . hydrALAZINE (APRESOLINE) 50 MG tablet Take 1 tablet (50 mg total) by mouth every 6 (six) hours.  Marland Kitchen ipratropium (ATROVENT) 0.02 % nebulizer solution Take 2.5 mLs (0.5 mg total) by nebulization every 4 (four) hours as needed for wheezing or shortness of breath (if refractory to Xopenex).  Marland Kitchen  levalbuterol (XOPENEX) 0.63 MG/3ML nebulizer solution Take 3 mLs (0.63 mg total) by nebulization 3 (three) times daily. (Patient taking differently: Take 0.63 mg by nebulization every 4 (four) hours as needed for wheezing. )  . loperamide (IMODIUM) 2 MG capsule Take 2 mg by mouth as needed for diarrhea or loose stools.  . magnesium hydroxide (MILK OF MAGNESIA) 400 MG/5ML suspension Take 30 mLs by mouth daily as needed for mild constipation.  . metoprolol tartrate (LOPRESSOR) 100 MG tablet Take 1 tablet (100 mg total) by mouth 2 (two) times daily.  Marland Kitchen Neomycin-Bacitracin-Polymyxin (TRIPLE ANTIBIOTIC) [phone] OINT Apply 1 application topically daily as needed (skin tears/abrasions).   Marland Kitchen omeprazole (PRILOSEC) 20 MG capsule Take 20 mg by mouth daily.  Marland Kitchen PARoxetine (PAXIL) 10 MG tablet Take 1 tablet (10 mg total) by mouth daily.  Marland Kitchen UNABLE TO FIND Take 1 each by mouth 3 (three) times daily. Mighty Shakes   No facility-administered encounter medications on file as of 03/31/2018.     PHYSICAL EXAM:   General: Sleepy, Caucasian female sitting in WC in activities sleeping Cardiovascular: irreg rhythm; reg rate Pulmonary: breathing easily; no cough or wheeze Abdomen: soft, active BS, NTTP Extremities: no edema; legs weak upon standing Skin: thin and fragile Neurological: easily arouses but goes back to sleep. +generalized weakness; word searches  Truett Perna, NP

## 2018-04-07 DIAGNOSIS — G309 Alzheimer's disease, unspecified: Secondary | ICD-10-CM | POA: Diagnosis not present

## 2018-04-07 DIAGNOSIS — F0281 Dementia in other diseases classified elsewhere with behavioral disturbance: Secondary | ICD-10-CM | POA: Diagnosis not present

## 2018-05-04 DIAGNOSIS — B351 Tinea unguium: Secondary | ICD-10-CM | POA: Diagnosis not present

## 2018-05-04 DIAGNOSIS — M79674 Pain in right toe(s): Secondary | ICD-10-CM | POA: Diagnosis not present

## 2018-05-04 DIAGNOSIS — M79675 Pain in left toe(s): Secondary | ICD-10-CM | POA: Diagnosis not present

## 2018-05-22 DIAGNOSIS — G308 Other Alzheimer's disease: Secondary | ICD-10-CM | POA: Diagnosis not present

## 2018-05-22 DIAGNOSIS — R2689 Other abnormalities of gait and mobility: Secondary | ICD-10-CM | POA: Diagnosis not present

## 2018-05-22 DIAGNOSIS — Z207 Contact with and (suspected) exposure to pediculosis, acariasis and other infestations: Secondary | ICD-10-CM | POA: Diagnosis not present

## 2018-05-26 DIAGNOSIS — I4891 Unspecified atrial fibrillation: Secondary | ICD-10-CM | POA: Diagnosis not present

## 2018-05-26 DIAGNOSIS — M1990 Unspecified osteoarthritis, unspecified site: Secondary | ICD-10-CM | POA: Diagnosis not present

## 2018-05-26 DIAGNOSIS — G308 Other Alzheimer's disease: Secondary | ICD-10-CM | POA: Diagnosis not present

## 2018-05-26 DIAGNOSIS — R2689 Other abnormalities of gait and mobility: Secondary | ICD-10-CM | POA: Diagnosis not present

## 2018-05-26 DIAGNOSIS — Z79899 Other long term (current) drug therapy: Secondary | ICD-10-CM | POA: Diagnosis not present

## 2018-07-20 DIAGNOSIS — Z23 Encounter for immunization: Secondary | ICD-10-CM | POA: Diagnosis not present

## 2018-07-26 DIAGNOSIS — K5909 Other constipation: Secondary | ICD-10-CM | POA: Diagnosis not present

## 2018-07-26 DIAGNOSIS — M79675 Pain in left toe(s): Secondary | ICD-10-CM | POA: Diagnosis not present

## 2018-07-26 DIAGNOSIS — Z79899 Other long term (current) drug therapy: Secondary | ICD-10-CM | POA: Diagnosis not present

## 2018-07-26 DIAGNOSIS — R4189 Other symptoms and signs involving cognitive functions and awareness: Secondary | ICD-10-CM | POA: Diagnosis not present

## 2018-07-26 DIAGNOSIS — B351 Tinea unguium: Secondary | ICD-10-CM | POA: Diagnosis not present

## 2018-07-26 DIAGNOSIS — K219 Gastro-esophageal reflux disease without esophagitis: Secondary | ICD-10-CM | POA: Diagnosis not present

## 2018-07-26 DIAGNOSIS — I10 Essential (primary) hypertension: Secondary | ICD-10-CM | POA: Diagnosis not present

## 2018-07-26 DIAGNOSIS — M79674 Pain in right toe(s): Secondary | ICD-10-CM | POA: Diagnosis not present

## 2018-10-05 ENCOUNTER — Emergency Department (HOSPITAL_COMMUNITY)
Admission: EM | Admit: 2018-10-05 | Discharge: 2018-10-06 | Disposition: A | Discharge status: Home / Self Care | Payer: Medicare Other | Attending: Emergency Medicine | Admitting: Emergency Medicine

## 2018-10-05 ENCOUNTER — Other Ambulatory Visit: Payer: Self-pay

## 2018-10-05 ENCOUNTER — Encounter (HOSPITAL_COMMUNITY): Payer: Self-pay

## 2018-10-05 ENCOUNTER — Emergency Department (HOSPITAL_COMMUNITY): Payer: Medicare Other

## 2018-10-05 DIAGNOSIS — J449 Chronic obstructive pulmonary disease, unspecified: Secondary | ICD-10-CM | POA: Insufficient documentation

## 2018-10-05 DIAGNOSIS — Z79899 Other long term (current) drug therapy: Secondary | ICD-10-CM | POA: Insufficient documentation

## 2018-10-05 DIAGNOSIS — E860 Dehydration: Secondary | ICD-10-CM | POA: Diagnosis not present

## 2018-10-05 DIAGNOSIS — E870 Hyperosmolality and hypernatremia: Secondary | ICD-10-CM | POA: Diagnosis not present

## 2018-10-05 DIAGNOSIS — R0902 Hypoxemia: Secondary | ICD-10-CM | POA: Diagnosis not present

## 2018-10-05 DIAGNOSIS — Z7901 Long term (current) use of anticoagulants: Secondary | ICD-10-CM | POA: Diagnosis not present

## 2018-10-05 DIAGNOSIS — F0390 Unspecified dementia without behavioral disturbance: Secondary | ICD-10-CM | POA: Diagnosis not present

## 2018-10-05 DIAGNOSIS — R4182 Altered mental status, unspecified: Secondary | ICD-10-CM | POA: Diagnosis not present

## 2018-10-05 DIAGNOSIS — I4891 Unspecified atrial fibrillation: Secondary | ICD-10-CM | POA: Diagnosis not present

## 2018-10-05 DIAGNOSIS — R918 Other nonspecific abnormal finding of lung field: Secondary | ICD-10-CM | POA: Diagnosis not present

## 2018-10-05 DIAGNOSIS — R29810 Facial weakness: Secondary | ICD-10-CM | POA: Diagnosis not present

## 2018-10-05 DIAGNOSIS — R4781 Slurred speech: Secondary | ICD-10-CM | POA: Diagnosis not present

## 2018-10-05 LAB — CBC WITH DIFFERENTIAL/PLATELET
Abs Immature Granulocytes: 0.04 10*3/uL (ref 0.00–0.07)
Basophils Absolute: 0 10*3/uL (ref 0.0–0.1)
Basophils Relative: 0 %
EOS ABS: 0 10*3/uL (ref 0.0–0.5)
EOS PCT: 0 %
HCT: 53.4 % — ABNORMAL HIGH (ref 36.0–46.0)
Hemoglobin: 14.8 g/dL (ref 12.0–15.0)
Immature Granulocytes: 0 %
Lymphocytes Relative: 13 %
Lymphs Abs: 1.4 10*3/uL (ref 0.7–4.0)
MCH: 27 pg (ref 26.0–34.0)
MCHC: 27.7 g/dL — ABNORMAL LOW (ref 30.0–36.0)
MCV: 97.3 fL (ref 80.0–100.0)
Monocytes Absolute: 0.6 10*3/uL (ref 0.1–1.0)
Monocytes Relative: 5 %
Neutro Abs: 9.1 10*3/uL — ABNORMAL HIGH (ref 1.7–7.7)
Neutrophils Relative %: 82 %
Platelets: 347 10*3/uL (ref 150–400)
RBC: 5.49 MIL/uL — ABNORMAL HIGH (ref 3.87–5.11)
RDW: 15.5 % (ref 11.5–15.5)
WBC: 11.3 10*3/uL — ABNORMAL HIGH (ref 4.0–10.5)
nRBC: 0 % (ref 0.0–0.2)

## 2018-10-05 LAB — COMPREHENSIVE METABOLIC PANEL
ALT: 74 U/L — ABNORMAL HIGH (ref 0–44)
AST: 71 U/L — ABNORMAL HIGH (ref 15–41)
Albumin: 3.3 g/dL — ABNORMAL LOW (ref 3.5–5.0)
Alkaline Phosphatase: 110 U/L (ref 38–126)
Anion gap: 9 (ref 5–15)
BUN: 42 mg/dL — ABNORMAL HIGH (ref 8–23)
CO2: 28 mmol/L (ref 22–32)
Calcium: 9.6 mg/dL (ref 8.9–10.3)
Chloride: 124 mmol/L — ABNORMAL HIGH (ref 98–111)
Creatinine, Ser: 2.16 mg/dL — ABNORMAL HIGH (ref 0.44–1.00)
GFR calc Af Amer: 23 mL/min — ABNORMAL LOW (ref >60)
GFR calc non Af Amer: 20 mL/min — ABNORMAL LOW (ref >60)
Glucose, Bld: 125 mg/dL — ABNORMAL HIGH (ref 70–99)
Potassium: 4.5 mmol/L (ref 3.5–5.1)
Sodium: 161 mmol/L (ref 135–145)
TOTAL PROTEIN: 8 g/dL (ref 6.5–8.1)
Total Bilirubin: 1.4 mg/dL — ABNORMAL HIGH (ref 0.3–1.2)

## 2018-10-05 LAB — I-STAT CHEM 8, ED
BUN: 55 mg/dL — AB (ref 8–23)
Calcium, Ion: 1.13 mmol/L — ABNORMAL LOW (ref 1.15–1.40)
Chloride: 128 mmol/L — ABNORMAL HIGH (ref 98–111)
Creatinine, Ser: 2 mg/dL — ABNORMAL HIGH (ref 0.44–1.00)
Glucose, Bld: 123 mg/dL — ABNORMAL HIGH (ref 70–99)
HCT: 47 % — ABNORMAL HIGH (ref 36.0–46.0)
Hemoglobin: 16 g/dL — ABNORMAL HIGH (ref 12.0–15.0)
Potassium: 4.3 mmol/L (ref 3.5–5.1)
Sodium: 163 mmol/L (ref 135–145)
TCO2: 30 mmol/L (ref 22–32)

## 2018-10-05 LAB — I-STAT VENOUS BLOOD GAS, ED
Acid-Base Excess: 4 mmol/L — ABNORMAL HIGH (ref 0.0–2.0)
Bicarbonate: 30.5 mmol/L — ABNORMAL HIGH (ref 20.0–28.0)
O2 Saturation: 35 %
TCO2: 32 mmol/L (ref 22–32)
pCO2, Ven: 55.4 mmHg (ref 44.0–60.0)
pH, Ven: 7.349 (ref 7.250–7.430)
pO2, Ven: 23 mmHg — CL (ref 32.0–45.0)

## 2018-10-05 LAB — URINALYSIS, ROUTINE W REFLEX MICROSCOPIC
Bilirubin Urine: NEGATIVE
Glucose, UA: NEGATIVE mg/dL
Hgb urine dipstick: NEGATIVE
Ketones, ur: NEGATIVE mg/dL
Nitrite: NEGATIVE
Protein, ur: NEGATIVE mg/dL
Specific Gravity, Urine: 1.021 (ref 1.005–1.030)
pH: 5 (ref 5.0–8.0)

## 2018-10-05 LAB — CBG MONITORING, ED: Glucose-Capillary: 107 mg/dL — ABNORMAL HIGH (ref 70–99)

## 2018-10-05 LAB — ETHANOL

## 2018-10-05 LAB — I-STAT CG4 LACTIC ACID, ED: LACTIC ACID, VENOUS: 1.93 mmol/L — AB (ref 0.5–1.9)

## 2018-10-05 LAB — AMMONIA: Ammonia: 32 umol/L (ref 9–35)

## 2018-10-05 MED ORDER — FOSFOMYCIN TROMETHAMINE 3 G PO PACK
3.0000 g | PACK | Freq: Once | ORAL | Status: DC
  Filled 2018-10-05: qty 3

## 2018-10-05 MED ORDER — SODIUM CHLORIDE 0.9 % IV BOLUS
1000.0000 mL | Freq: Once | INTRAVENOUS | Status: AC
  Administered 2018-10-05: 1000 mL via INTRAVENOUS

## 2018-10-05 NOTE — ED Provider Notes (Signed)
[facility] Provider Note   CSN: [id number] Arrival date & time: 10/06/2018  1712     History   Chief Complaint Chief Complaint  Patient presents with  . Altered Mental Status    HPI Brooke Phelps is a 84 y.o. female.  84 yo F with a chief complaint of altered mental status.  Per her nursing home she is not acting herself and more agitated than normal.  No infectious symptoms were noted.  Patient is able to tell me her name but is unable to provide any other information.  She repeats back whatever I say to her.  She denies anything that hurts her.  Denies cough congestion fever abdominal pain.  Level 5 caveat dementia  The history is provided by the patient and the EMS personnel.  Altered Mental Status   This is a new problem. The current episode started yesterday. The problem has not changed since onset.Associated symptoms include confusion and agitation. Her past medical history is significant for dementia.    Past Medical History:  Diagnosis Date  . Asthma   . Atrial fibrillation (HCC)   . COPD (chronic obstructive pulmonary disease) (HCC)   . Dementia [facility](HCC)     Patient Active Problem List   Diagnosis Date Noted  . HCAP (healthcare-associated pneumonia) 09/30/2017  . Acute encephalopathy 03/12/2016  . UTI (lower urinary tract infection) 03/12/2016  . Acute respiratory failure with hypoxia (HCC) 03/12/2016  . Altered mental status 03/11/2016  . Atrial flutter (HCC) 11/12/2013  . Hypoxia 11/11/2013  . Dementia with behavioral disturbance (HCC) 11/11/2013  . ILD (interstitial lung disease) (HCC) 11/11/2013  . Bimalleolar ankle fracture 11/10/2013    Past Surgical History:  Procedure Laterality Date  . ORIF ANKLE FRACTURE Left 11/11/2013   Procedure: OPEN REDUCTION INTERNAL FIXATION (ORIF) ANKLE FRACTURE;  Surgeon: Nadara MustardMarcus V Duda, MD;  Location: [facility] OR;  Service: Orthopedics;  Laterality: Left;     OB History   None       Home Medications    Prior to Admission medications   Medication Sig Start Date End Date Taking? Authorizing Provider  acetaminophen (TYLENOL) 500 MG tablet Take 2 tablets (1,000 mg total) by mouth 3 (three) times daily. Patient taking differently: Take 500 mg by mouth every 4 (four) hours as needed for mild pain.  11/14/13   Ghimire, Werner LeanShanker M, MD  ALPRAZolam Prudy Feeler(XANAX) 0.5 MG tablet Take 1 tablet (0.5 mg total) by mouth every 8 (eight) hours as needed for anxiety. Patient taking differently: Take 0.5 mg by mouth 2 (two) times daily.  11/14/13   Ghimire, Werner LeanShanker M, MD  alum & mag hydroxide-simeth (MINTOX) 200-200-20 MG/5ML suspension Take 30 mLs by mouth every 6 (six) hours as needed for indigestion or heartburn.    [provider]  aspirin 325 MG tablet Take 1 tablet (325 mg total) by mouth daily. 11/14/13   Ghimire, Werner LeanShanker M, MD  benzonatate (TESSALON) 100 MG capsule Take 100 mg by mouth daily.    [provider]  diltiazem (CARDIZEM) 60 MG tablet Take 1 tablet (60 mg total) by mouth every 8 (eight) hours. 10/09/17   Kathlen ModyAkula, Vijaya, MD  docusate sodium 100 MG CAPS Take 100 mg by mouth 2 (two) times daily. Patient taking differently: Take 100 mg by mouth daily.  11/14/13   Ghimire, Werner LeanShanker M, MD  FLUZONE HIGH-DOSE 0.5 ML injection Inject 0.5 mLs into the skin once. 07/14/17 07/14/17   [provider]  guaiFENesin (MUCINEX) 600  MG 12 hr tablet Take 600 mg by mouth 2 (two) times daily.    [provider]  hydrALAZINE (APRESOLINE) 50 MG tablet Take 1 tablet (50 mg total) by mouth every 6 (six) hours. 10/09/17   Kathlen Mody, MD  ipratropium (ATROVENT) 0.02 % nebulizer solution Take 2.5 mLs (0.5 mg total) by nebulization every 4 (four) hours as needed for wheezing or shortness of breath (if refractory to Xopenex). 10/09/17   Kathlen Mody, MD  levalbuterol (XOPENEX) 0.63 MG/3ML nebulizer solution Take 3 mLs (0.63 mg total) by nebulization 3 (three) times  daily. Patient taking differently: Take 0.63 mg by nebulization every 4 (four) hours as needed for wheezing.  11/14/13   Ghimire, Werner Lean, MD  loperamide (IMODIUM) 2 MG capsule Take 2 mg by mouth as needed for diarrhea or loose stools.    [provider]  magnesium hydroxide (MILK OF MAGNESIA) 400 MG/5ML suspension Take 30 mLs by mouth daily as needed for mild constipation.    [provider]  metoprolol tartrate (LOPRESSOR) 100 MG tablet Take 1 tablet (100 mg total) by mouth 2 (two) times daily. 10/09/17   Kathlen Mody, MD  Neomycin-Bacitracin-Polymyxin (TRIPLE ANTIBIOTIC) [phone] OINT Apply 1 application topically daily as needed (skin tears/abrasions).     [provider]  omeprazole (PRILOSEC) 20 MG capsule Take 20 mg by mouth daily.    [provider]  PARoxetine (PAXIL) 10 MG tablet Take 1 tablet (10 mg total) by mouth daily. 11/14/13   Ghimire, Werner Lean, MD  UNABLE TO FIND Take 1 each by mouth 3 (three) times daily. Mighty Shakes    [provider]    Family History Family History  Problem Relation Age of Onset  . Dementia Sister   . Diabetes Mellitus II Neg Hx   . Hypertension Neg Hx     Social History Social History   Tobacco Use  . Smoking status: Never Smoker  . Smokeless tobacco: Never Used  Substance Use Topics  . Alcohol use: No  . Drug use: No     Allergies   Patient has no known allergies.   Review of Systems Review of Systems  Unable to perform ROS: Dementia  Constitutional: Negative for chills and fever.  HENT: Negative for congestion and rhinorrhea.   Eyes: Negative for redness and visual disturbance.  Respiratory: Negative for shortness of breath and wheezing.   Cardiovascular: Negative for chest pain and palpitations.  Gastrointestinal: Negative for nausea and vomiting.  Genitourinary: Negative for dysuria and urgency.  Musculoskeletal: Negative for arthralgias and myalgias.  Skin: Negative for  pallor and wound.  Neurological: Negative for dizziness and headaches.  Psychiatric/Behavioral: Positive for agitation and confusion.     Physical Exam Updated Vital Signs BP 110/71   Pulse 71   Temp 98.5 F (36.9 C) (Rectal)   Resp 19   Ht 5\' 1"  (1.549 m)   Wt 80.8 kg   SpO2 97%   BMI 33.66 kg/m   Physical Exam  Constitutional: She appears well-developed and well-nourished. No distress.  HENT:  Head: Normocephalic and atraumatic.  Eyes: Pupils are equal, round, and reactive to light. EOM are normal.  Neck: Normal range of motion. Neck supple.  Cardiovascular: Normal rate and regular rhythm. Exam reveals no gallop and no friction rub.  No murmur heard. Pulmonary/Chest: Effort normal. She has no wheezes. She has no rales.  Abdominal: Soft. She exhibits no distension. There is no tenderness.  Musculoskeletal: She exhibits no edema or tenderness.  Neurological: She is alert.  Patient repeats back what ever I say to her  Skin: Skin is warm and dry. She is not diaphoretic.  Psychiatric: She has a normal mood and affect. Her behavior is normal.  Nursing note and vitals reviewed.    ED Treatments / Results  Labs (all labs ordered are listed, but only abnormal results are displayed) Labs Reviewed  COMPREHENSIVE METABOLIC PANEL - Abnormal; Notable for the following components:      Result Value   Sodium 161 (*)    Chloride 124 (*)    Glucose, Bld 125 (*)    BUN 42 (*)    Creatinine, Ser 2.16 (*)    Albumin 3.3 (*)    AST 71 (*)    ALT 74 (*)    Total Bilirubin 1.4 (*)    GFR calc non Af Amer 20 (*)    GFR calc Af Amer 23 (*)    All other components within normal limits  CBC WITH DIFFERENTIAL/PLATELET - Abnormal; Notable for the following components:   WBC 11.3 (*)    RBC 5.49 (*)    HCT 53.4 (*)    MCHC 27.7 (*)    Neutro Abs 9.1 (*)    All other components within normal limits  URINALYSIS, ROUTINE W REFLEX MICROSCOPIC - Abnormal; Notable for the following  components:   Color, Urine AMBER (*)    APPearance CLOUDY (*)    Leukocytes, UA LARGE (*)    Bacteria, UA MANY (*)    All other components within normal limits  CBG MONITORING, ED - Abnormal; Notable for the following components:   Glucose-Capillary 107 (*)    All other components within normal limits  I-STAT CHEM 8, ED - Abnormal; Notable for the following components:   Sodium 163 (*)    Chloride 128 (*)    BUN 55 (*)    Creatinine, Ser 2.00 (*)    Glucose, Bld 123 (*)    Calcium, Ion 1.13 (*)    Hemoglobin 16.0 (*)    HCT 47.0 (*)    All other components within normal limits  I-STAT CG4 LACTIC ACID, ED - Abnormal; Notable for the following components:   Lactic Acid, Venous 1.93 (*)    All other components within normal limits  I-STAT VENOUS BLOOD GAS, ED - Abnormal; Notable for the following components:   pO2, Ven 23.0 (*)    Bicarbonate 30.5 (*)    Acid-Base Excess 4.0 (*)    All other components within normal limits  URINE CULTURE  AMMONIA  ETHANOL    EKG EKG Interpretation  Date/Time:  Wednesday 21-Oct-2018 17:23:48 EST Ventricular Rate:  70 PR Interval:    QRS Duration: 88 QT Interval:  445 QTC Calculation: 481 R Axis:   -3 Text Interpretation:  Sinus rhythm No significant change since last tracing Confirmed by Melene Plan [phone]) on 2018/10/21 6:16:55 PM   Radiology Dg Chest 2 View  Result Date: 10/21/2018 CLINICAL DATA:  Altered level of consciousness EXAM: CHEST - 2 VIEW COMPARISON:  10/05/2017 FINDINGS: Normal heart size. Mild aortic atherosclerosis. Slightly low lung volumes. Granuloma in the right mid lung. Subtle opacity in the left upper lobe suspicious for small focus of pneumonia and/or atelectasis. Summation of overlapping ribs and pulmonary vasculature may also account for some of this appearance. Degenerative changes are present along the dorsal spine. IMPRESSION: Subtle opacity in the left upper lobe may be secondary to a small focus of pneumonia  or atelectasis.  Summation of overlapping ribs and pulmonary vessels may also be contributing to this appearance. Minimal aortic atherosclerosis without aneurysm. Electronically Signed   By: Tollie Eth M.D.   On: 11-04-2018 18:56   Ct Head Wo Contrast  Result Date: 11-04-2018 CLINICAL DATA:  Acute mental status change. EXAM: CT HEAD WITHOUT CONTRAST TECHNIQUE: Contiguous axial images were obtained from the base of the skull through the vertex without intravenous contrast. COMPARISON:  October 06, 2017 FINDINGS: Brain: No subdural, epidural, or subarachnoid hemorrhage. Infarct is seen in the right a septal lobe, unchanged with encephalomalacia. No other infarcts. Moderate to severe white matter changes noted. Ventricles are dilated and stable. Cerebellum, brainstem, and basal cisterns are unremarkable. No acute cortical ischemia or infarct. No mass effect or midline shift. Vascular: Calcified atherosclerosis in the intracranial carotids. Skull: Normal. Negative for fracture or focal lesion. Sinuses/Orbits: Fluid is seen in the sphenoid sinuses. Paranasal sinuses, mastoid air cells, and middle ears otherwise normal. Other: None. IMPRESSION: No acute intracranial abnormalities. Chronic right occipital infarct and white matter changes. Electronically Signed   By: Gerome Sam III M.D   On: 2018/11/04 19:25    Procedures Procedures (including critical care time)  Medications Ordered in ED Medications  fosfomycin (MONUROL) packet 3 g (has no administration in time range)  sodium chloride 0.9 % bolus 1,000 mL (0 mLs Intravenous Stopped 11/04/2018 2145)     Initial Impression / Assessment and Plan / ED Course  I have reviewed the triage vital signs and the nursing notes.  Pertinent labs & imaging results that were available during my care of the patient were reviewed by me and considered in my medical decision making (see chart for details).     84 yo F with a chief complaint of altered mental  status.  More agitated than normal, patient is pleasantly demented on my exam.  Found to have a sodium of 163 on i-STAT, she does appear significantly dehydrated we will give a bolus of fluids.  Discussed with the family.  They understand the patient may be at the end of her life.  They feel that she would like to go home. UA contaminated, TNT bacteria and large leukocytes.  Will give a dose of fosfomycin.  D/c home.  CRITICAL CARE Performed by: Rae Roam   Total critical care time: 35 minutes  Critical care time was exclusive of separately billable procedures and treating other patients.  Critical care was necessary to treat or prevent imminent or life-threatening deterioration.  Critical care was time spent personally by me on the following activities: development of treatment plan with patient and/or surrogate as well as nursing, discussions with consultants, evaluation of patient's response to treatment, examination of patient, obtaining history from patient or surrogate, ordering and performing treatments and interventions, ordering and review of laboratory studies, ordering and review of radiographic studies, pulse oximetry and re-evaluation of patient's condition.   10:12 PM:  I have discussed the diagnosis/risks/treatment options with the patient and family and believe the pt to be eligible for discharge home to follow-up with PCP. We also discussed returning to the ED immediately if new or worsening sx occur. We discussed the sx which are most concerning (e.g., sudden worsening pain, fever, inability to tolerate by mouth) that necessitate immediate return. Medications administered to the patient during their visit and any new prescriptions provided to the patient are listed below.  Medications given during this visit Medications  fosfomycin (MONUROL) packet 3 g (has no administration in time  range)  sodium chloride 0.9 % bolus 1,000 mL (0 mLs Intravenous Stopped 10/30/2018 2145)       The patient appears reasonably screen and/or stabilized for discharge and I doubt any other medical condition or other [facility] requiring further screening, evaluation, or treatment in the ED at this time prior to discharge.    Final Clinical Impressions(s) / ED Diagnoses   Final diagnoses:  Hypernatremia  Dehydration    ED Discharge Orders    None       Melene Plan, DO 10/07/2018 2212

## 2018-10-05 NOTE — ED Notes (Signed)
Dr.Floyd notified that sodium resulted at 161

## 2018-10-05 NOTE — Discharge Instructions (Signed)
Return for any worsening concern.

## 2018-10-05 NOTE — ED Notes (Signed)
Patient transported to X-ray 

## 2018-10-05 NOTE — ED Triage Notes (Signed)
Pt brought in by EMS due to AMS. Per facility staff, pt was not acting her normal self. Pt was agitated with EMS. Pt has hx of dementia. Pt a&ox1.

## 2018-10-06 ENCOUNTER — Encounter: Payer: Medicare Other | Admitting: Internal Medicine

## 2018-10-06 ENCOUNTER — Non-Acute Institutional Stay: Payer: Medicare Other | Admitting: Internal Medicine

## 2018-10-06 ENCOUNTER — Encounter: Payer: Self-pay | Admitting: Internal Medicine

## 2018-10-06 VITALS — BP 110/60 | HR 76 | Resp 20 | Ht 60.0 in | Wt 125.0 lb

## 2018-10-06 DIAGNOSIS — F0391 Unspecified dementia with behavioral disturbance: Secondary | ICD-10-CM

## 2018-10-06 NOTE — ED Notes (Signed)
Never dc by prior rn

## 2018-10-06 NOTE — Progress Notes (Signed)
[facility] Telephone: [phone] Fax: [phone]  PATIENT NAME: Brooke Phelps DOB: 11-13-1927 MRN: [id number]  PRIMARY CARE PROVIDER:   Florentina Jennyripp, Henry, MD  REFERRING PROVIDER:  Florentina Jennyripp, Henry, MD [phone] [address] [place] [address]  RESPONSIBLE PARTY:   Wynne DustKathy Cid (D-I-L) [phone], Barrett Henleerry Fitting (son) [phone]  ASSESSMENT:      PCP: Merceda Elksonnie Kurth 1 [phone] , Dr. Orson SlickBowman PCP. [facility] Redmond Schoolripp,  Lawanna KobusAngel Sheets  NP [phone]) [facility] [phone]   IMPRESSIONS / RECOMMENDATIONS:    1. Progressive dementia, FTT, and dehydration: Patient's PPS is 20%. FAST: 7c. She is dependent for hygiene, dressing, transfers, and now for feeding. Patient is constantly confused. She recognizes her D-I-L's face but not name. She has episodic agitation with yelling out. She dozes in wheelchair much of the day. Staff report that over the last few weeks, patient is increasingly lethargic and fatigued. D-I-L notes decreased verbalizations. Patient's  speech is more garbled. Over the last 4 days, she has been unable to weight bear to transfer. Previously was a 1 person pivot transfer. Two weeks ago was able to feed herself 50% of meals. Now needs to be fed her pureed diet, and will take in only a few bites and sips. She pockets or spits out her food, and staff note coughing when drinking fluids suspicious for aspiration. D-I-L notes progressive weight loss so that her dentures no longer fit, and that her clothing is fitting more loosely. Her weight is125lbs, which is a loss of 42 lbs over the last 2 years. Height 5', for a BMI of 24.4kg/m2.  - Recommend Hospice Referral. I discussed hospice services, eligibility criteria and  scope of services with D-I-L Wynne DustKathy Crites, and she wishes to proceed. Dr. Redmond Schoolripp consulted; he is agreeable with referral and with continuing to serve as attending. I will call in referral tomorrow am.   2. Possible UTI: urine culture form yesterday ER  visit is pending. UA thought contaminated.  3. Advanced care directives: DNR on chart.  I spent 75 minutes providing this consultation,  from 3pm to 4:15pm. More than 50% of this time was spent coordinating communication.   HISTORY OF PRESENT ILLNESS:  Brooke Phelps is a 84 y.o. female with h/o asthma, atrial fibrillation (paroxysmal), COPD, and dementia. She is s/p an ER visit yesterday for MS changes, and found to have a NA level of 163. Her BUN/Creatine was 55/2. WBC elevated at 11.3. Preliminary UA was positve for leukocytes and many bacteria. She was given a dose of fosfomycin (Monurol). Urine culture is pending. She was given IVFs then, upon consultation with patient's family members, [facility]. Palliative Care was asked to help address goals of care, with an eye for referral to Hospice Services.   CODE STATUS: DNR  PPS: 20% HOSPICE ELIGIBILITY/DIAGNOSIS: yes, referral to be sent  PAST MEDICAL HISTORY:  Past Medical History:  Diagnosis Date  . Asthma   . Atrial fibrillation (HCC)   . COPD (chronic obstructive pulmonary disease) (HCC)   . Dementia (HCC)     SOCIAL HX:  Social History   Tobacco Use  . Smoking status: Never Smoker  . Smokeless tobacco: Never Used  Substance Use Topics  . Alcohol use: No    ALLERGIES: No Known Allergies   PERTINENT MEDICATIONS:  Outpatient Encounter Medications as of 10/06/2018  Medication Sig  . acetaminophen (TYLENOL) 500 MG tablet Take  2 tablets (1,000 mg total) by mouth 3 (three) times daily. (Patient taking differently: Take 500 mg by mouth every 4 (four) hours as needed for mild pain. )  . alum & mag hydroxide-simeth (MINTOX) 200-200-20 MG/5ML suspension Take 30 mLs by mouth every 6 (six) hours as needed for indigestion or heartburn.  Marland Kitchen aspirin 325 MG tablet Take 1 tablet (325 mg total) by mouth daily.  Marland Kitchen diltiazem (CARDIZEM) 60 MG tablet Take 1 tablet (60 mg total) by  mouth every 8 (eight) hours.  . docusate sodium 100 MG CAPS Take 100 mg by mouth 2 (two) times daily.  Marland Kitchen FLUZONE HIGH-DOSE 0.5 ML injection Inject 0.5 mLs into the skin once. 07/14/17  . hydrALAZINE (APRESOLINE) 50 MG tablet Take 1 tablet (50 mg total) by mouth every 6 (six) hours.  Marland Kitchen ipratropium (ATROVENT) 0.02 % nebulizer solution Take 2.5 mLs (0.5 mg total) by nebulization every 4 (four) hours as needed for wheezing or shortness of breath (if refractory to Xopenex).  Marland Kitchen loperamide (IMODIUM) 2 MG capsule Take 2 mg by mouth as needed for diarrhea or loose stools.  . magnesium hydroxide (MILK OF MAGNESIA) 400 MG/5ML suspension Take 30 mLs by mouth daily as needed for mild constipation.  . metoprolol tartrate (LOPRESSOR) 50 MG tablet Take 50 mg by mouth 2 (two) times daily.  Marland Kitchen Neomycin-Bacitracin-Polymyxin (TRIPLE ANTIBIOTIC) [phone] OINT Apply 1 application topically daily as needed (skin tears/abrasions).   Marland Kitchen omeprazole (PRILOSEC) 20 MG capsule Take 20 mg by mouth daily.  Marland Kitchen PARoxetine (PAXIL) 10 MG tablet Take 1 tablet (10 mg total) by mouth daily.  Marland Kitchen UNABLE TO FIND Take 1 each by mouth 3 (three) times daily. Mighty Shakes  . [DISCONTINUED] levalbuterol (XOPENEX) 0.63 MG/3ML nebulizer solution Take 3 mLs (0.63 mg total) by nebulization 3 (three) times daily. (Patient taking differently: Take 0.63 mg by nebulization every 4 (four) hours as needed for wheezing. )  . [DISCONTINUED] ALPRAZolam (XANAX) 0.5 MG tablet Take 1 tablet (0.5 mg total) by mouth every 8 (eight) hours as needed for anxiety. (Patient taking differently: Take 0.5 mg by mouth 2 (two) times daily. )  . [DISCONTINUED] benzonatate (TESSALON) 100 MG capsule Take 100 mg by mouth daily.  . [DISCONTINUED] guaiFENesin (MUCINEX) 600 MG 12 hr tablet Take 600 mg by mouth 2 (two) times daily.  . [DISCONTINUED] metoprolol tartrate (LOPRESSOR) 100 MG tablet Take 1 tablet (100 mg total) by mouth 2 (two) times daily. (Patient taking differently:  Take 50 mg by mouth 2 (two) times daily. )  . [DISCONTINUED] fosfomycin (MONUROL) packet 3 g    No facility-administered encounter medications on file as of 10/06/2018.     PHYSICAL EXAM:  VS 110/62, HR 76 regular, RR 20 Elderly, frail, slender Caucasian female sitting up in the wheelchair with rhythmic movements of head, UEs and LEs.  Her D-I-L Brooke Phelps is attempting to spoon feed her some applesauce. Patient lethargic, but will alert and maintain some brief eye contact and speak a few soft words. Cardiovascular: regular rate and rhythm Pulmonary: clear ant fields Abdomen: soft, nontender, + bowel sounds Extremities: no edema, no joint deformities Skin: no rashes Neurological: Weakness but otherwise nonfocal  Anselm Lis, NP

## 2018-10-07 DIAGNOSIS — Z8673 Personal history of transient ischemic attack (TIA), and cerebral infarction without residual deficits: Secondary | ICD-10-CM | POA: Diagnosis not present

## 2018-10-07 DIAGNOSIS — J4520 Mild intermittent asthma, uncomplicated: Secondary | ICD-10-CM | POA: Diagnosis not present

## 2018-10-07 DIAGNOSIS — K219 Gastro-esophageal reflux disease without esophagitis: Secondary | ICD-10-CM | POA: Diagnosis not present

## 2018-10-07 DIAGNOSIS — I10 Essential (primary) hypertension: Secondary | ICD-10-CM | POA: Diagnosis not present

## 2018-10-07 DIAGNOSIS — G309 Alzheimer's disease, unspecified: Secondary | ICD-10-CM | POA: Diagnosis not present

## 2018-10-07 DIAGNOSIS — I4891 Unspecified atrial fibrillation: Secondary | ICD-10-CM | POA: Diagnosis not present

## 2018-10-07 DIAGNOSIS — N179 Acute kidney failure, unspecified: Secondary | ICD-10-CM | POA: Diagnosis not present

## 2018-10-07 DIAGNOSIS — E860 Dehydration: Secondary | ICD-10-CM | POA: Diagnosis not present

## 2018-10-07 DIAGNOSIS — F329 Major depressive disorder, single episode, unspecified: Secondary | ICD-10-CM | POA: Diagnosis not present

## 2018-10-07 DIAGNOSIS — F0281 Dementia in other diseases classified elsewhere with behavioral disturbance: Secondary | ICD-10-CM | POA: Diagnosis not present

## 2018-10-07 DIAGNOSIS — E870 Hyperosmolality and hypernatremia: Secondary | ICD-10-CM | POA: Diagnosis not present

## 2018-10-07 DIAGNOSIS — J42 Unspecified chronic bronchitis: Secondary | ICD-10-CM | POA: Diagnosis not present

## 2018-10-07 DIAGNOSIS — J449 Chronic obstructive pulmonary disease, unspecified: Secondary | ICD-10-CM | POA: Diagnosis not present

## 2018-10-07 LAB — URINE CULTURE: Culture: 80000 — AB

## 2018-10-08 ENCOUNTER — Telehealth: Payer: Self-pay

## 2018-10-08 DIAGNOSIS — N179 Acute kidney failure, unspecified: Secondary | ICD-10-CM | POA: Diagnosis not present

## 2018-10-08 DIAGNOSIS — E860 Dehydration: Secondary | ICD-10-CM | POA: Diagnosis not present

## 2018-10-08 DIAGNOSIS — E870 Hyperosmolality and hypernatremia: Secondary | ICD-10-CM | POA: Diagnosis not present

## 2018-10-08 DIAGNOSIS — F0281 Dementia in other diseases classified elsewhere with behavioral disturbance: Secondary | ICD-10-CM | POA: Diagnosis not present

## 2018-10-08 DIAGNOSIS — F329 Major depressive disorder, single episode, unspecified: Secondary | ICD-10-CM | POA: Diagnosis not present

## 2018-10-08 DIAGNOSIS — G309 Alzheimer's disease, unspecified: Secondary | ICD-10-CM | POA: Diagnosis not present

## 2018-10-08 NOTE — Telephone Encounter (Signed)
No treatment for UC 10/06/18 [facility]

## 2018-10-09 DIAGNOSIS — F0281 Dementia in other diseases classified elsewhere with behavioral disturbance: Secondary | ICD-10-CM | POA: Diagnosis not present

## 2018-10-09 DIAGNOSIS — N179 Acute kidney failure, unspecified: Secondary | ICD-10-CM | POA: Diagnosis not present

## 2018-10-09 DIAGNOSIS — E870 Hyperosmolality and hypernatremia: Secondary | ICD-10-CM | POA: Diagnosis not present

## 2018-10-09 DIAGNOSIS — G309 Alzheimer's disease, unspecified: Secondary | ICD-10-CM | POA: Diagnosis not present

## 2018-10-09 DIAGNOSIS — F329 Major depressive disorder, single episode, unspecified: Secondary | ICD-10-CM | POA: Diagnosis not present

## 2018-10-09 DIAGNOSIS — E860 Dehydration: Secondary | ICD-10-CM | POA: Diagnosis not present

## 2018-10-10 DIAGNOSIS — F0281 Dementia in other diseases classified elsewhere with behavioral disturbance: Secondary | ICD-10-CM | POA: Diagnosis not present

## 2018-10-10 DIAGNOSIS — G309 Alzheimer's disease, unspecified: Secondary | ICD-10-CM | POA: Diagnosis not present

## 2018-10-10 DIAGNOSIS — E870 Hyperosmolality and hypernatremia: Secondary | ICD-10-CM | POA: Diagnosis not present

## 2018-10-10 DIAGNOSIS — E860 Dehydration: Secondary | ICD-10-CM | POA: Diagnosis not present

## 2018-10-10 DIAGNOSIS — F329 Major depressive disorder, single episode, unspecified: Secondary | ICD-10-CM | POA: Diagnosis not present

## 2018-10-10 DIAGNOSIS — N179 Acute kidney failure, unspecified: Secondary | ICD-10-CM | POA: Diagnosis not present

## 2018-10-11 DIAGNOSIS — B351 Tinea unguium: Secondary | ICD-10-CM | POA: Diagnosis not present

## 2018-10-11 DIAGNOSIS — M79674 Pain in right toe(s): Secondary | ICD-10-CM | POA: Diagnosis not present

## 2018-10-11 DIAGNOSIS — M79675 Pain in left toe(s): Secondary | ICD-10-CM | POA: Diagnosis not present

## 2018-10-12 DIAGNOSIS — Z79899 Other long term (current) drug therapy: Secondary | ICD-10-CM | POA: Diagnosis not present

## 2018-10-12 DIAGNOSIS — G308 Other Alzheimer's disease: Secondary | ICD-10-CM | POA: Diagnosis not present

## 2018-10-12 DIAGNOSIS — R634 Abnormal weight loss: Secondary | ICD-10-CM | POA: Diagnosis not present

## 2018-10-13 DIAGNOSIS — F0281 Dementia in other diseases classified elsewhere with behavioral disturbance: Secondary | ICD-10-CM | POA: Diagnosis not present

## 2018-10-13 DIAGNOSIS — G309 Alzheimer's disease, unspecified: Secondary | ICD-10-CM | POA: Diagnosis not present

## 2018-10-13 DIAGNOSIS — N179 Acute kidney failure, unspecified: Secondary | ICD-10-CM | POA: Diagnosis not present

## 2018-10-13 DIAGNOSIS — E860 Dehydration: Secondary | ICD-10-CM | POA: Diagnosis not present

## 2018-10-13 DIAGNOSIS — F329 Major depressive disorder, single episode, unspecified: Secondary | ICD-10-CM | POA: Diagnosis not present

## 2018-10-13 DIAGNOSIS — E870 Hyperosmolality and hypernatremia: Secondary | ICD-10-CM | POA: Diagnosis not present

## 2018-10-19 DIAGNOSIS — G309 Alzheimer's disease, unspecified: Secondary | ICD-10-CM | POA: Diagnosis not present

## 2018-10-19 DIAGNOSIS — F329 Major depressive disorder, single episode, unspecified: Secondary | ICD-10-CM | POA: Diagnosis not present

## 2018-10-19 DIAGNOSIS — F0281 Dementia in other diseases classified elsewhere with behavioral disturbance: Secondary | ICD-10-CM | POA: Diagnosis not present

## 2018-10-19 DIAGNOSIS — N179 Acute kidney failure, unspecified: Secondary | ICD-10-CM | POA: Diagnosis not present

## 2018-10-19 DIAGNOSIS — E860 Dehydration: Secondary | ICD-10-CM | POA: Diagnosis not present

## 2018-10-19 DIAGNOSIS — E870 Hyperosmolality and hypernatremia: Secondary | ICD-10-CM | POA: Diagnosis not present

## 2018-10-20 DIAGNOSIS — F329 Major depressive disorder, single episode, unspecified: Secondary | ICD-10-CM | POA: Diagnosis not present

## 2018-10-20 DIAGNOSIS — G309 Alzheimer's disease, unspecified: Secondary | ICD-10-CM | POA: Diagnosis not present

## 2018-10-20 DIAGNOSIS — E870 Hyperosmolality and hypernatremia: Secondary | ICD-10-CM | POA: Diagnosis not present

## 2018-10-20 DIAGNOSIS — F0281 Dementia in other diseases classified elsewhere with behavioral disturbance: Secondary | ICD-10-CM | POA: Diagnosis not present

## 2018-10-20 DIAGNOSIS — E860 Dehydration: Secondary | ICD-10-CM | POA: Diagnosis not present

## 2018-10-20 DIAGNOSIS — N179 Acute kidney failure, unspecified: Secondary | ICD-10-CM | POA: Diagnosis not present

## 2018-10-21 DIAGNOSIS — F0281 Dementia in other diseases classified elsewhere with behavioral disturbance: Secondary | ICD-10-CM | POA: Diagnosis not present

## 2018-10-21 DIAGNOSIS — E860 Dehydration: Secondary | ICD-10-CM | POA: Diagnosis not present

## 2018-10-21 DIAGNOSIS — E870 Hyperosmolality and hypernatremia: Secondary | ICD-10-CM | POA: Diagnosis not present

## 2018-10-21 DIAGNOSIS — F329 Major depressive disorder, single episode, unspecified: Secondary | ICD-10-CM | POA: Diagnosis not present

## 2018-10-21 DIAGNOSIS — G309 Alzheimer's disease, unspecified: Secondary | ICD-10-CM | POA: Diagnosis not present

## 2018-10-21 DIAGNOSIS — N179 Acute kidney failure, unspecified: Secondary | ICD-10-CM | POA: Diagnosis not present

## 2018-10-23 DIAGNOSIS — F329 Major depressive disorder, single episode, unspecified: Secondary | ICD-10-CM | POA: Diagnosis not present

## 2018-10-23 DIAGNOSIS — N179 Acute kidney failure, unspecified: Secondary | ICD-10-CM | POA: Diagnosis not present

## 2018-10-23 DIAGNOSIS — G309 Alzheimer's disease, unspecified: Secondary | ICD-10-CM | POA: Diagnosis not present

## 2018-10-23 DIAGNOSIS — E870 Hyperosmolality and hypernatremia: Secondary | ICD-10-CM | POA: Diagnosis not present

## 2018-10-23 DIAGNOSIS — F0281 Dementia in other diseases classified elsewhere with behavioral disturbance: Secondary | ICD-10-CM | POA: Diagnosis not present

## 2018-10-23 DIAGNOSIS — E860 Dehydration: Secondary | ICD-10-CM | POA: Diagnosis not present

## 2018-10-24 DIAGNOSIS — F329 Major depressive disorder, single episode, unspecified: Secondary | ICD-10-CM | POA: Diagnosis not present

## 2018-10-24 DIAGNOSIS — F0281 Dementia in other diseases classified elsewhere with behavioral disturbance: Secondary | ICD-10-CM | POA: Diagnosis not present

## 2018-10-24 DIAGNOSIS — E870 Hyperosmolality and hypernatremia: Secondary | ICD-10-CM | POA: Diagnosis not present

## 2018-10-24 DIAGNOSIS — N179 Acute kidney failure, unspecified: Secondary | ICD-10-CM | POA: Diagnosis not present

## 2018-10-24 DIAGNOSIS — G309 Alzheimer's disease, unspecified: Secondary | ICD-10-CM | POA: Diagnosis not present

## 2018-10-24 DIAGNOSIS — E860 Dehydration: Secondary | ICD-10-CM | POA: Diagnosis not present

## 2018-10-28 DIAGNOSIS — F329 Major depressive disorder, single episode, unspecified: Secondary | ICD-10-CM | POA: Diagnosis not present

## 2018-10-28 DIAGNOSIS — E860 Dehydration: Secondary | ICD-10-CM | POA: Diagnosis not present

## 2018-10-28 DIAGNOSIS — G309 Alzheimer's disease, unspecified: Secondary | ICD-10-CM | POA: Diagnosis not present

## 2018-10-28 DIAGNOSIS — N179 Acute kidney failure, unspecified: Secondary | ICD-10-CM | POA: Diagnosis not present

## 2018-10-28 DIAGNOSIS — F0281 Dementia in other diseases classified elsewhere with behavioral disturbance: Secondary | ICD-10-CM | POA: Diagnosis not present

## 2018-10-28 DIAGNOSIS — E870 Hyperosmolality and hypernatremia: Secondary | ICD-10-CM | POA: Diagnosis not present

## 2018-11-02 DIAGNOSIS — 419620001 Death: Secondary | SNOMED CT | POA: Diagnosis not present

## 2018-11-02 DEATH — deceased

## 2018-12-03 DEATH — deceased

## 2019-09-06 IMAGING — CT CT HEAD W/O CM
4 of 5 series · 17 of 47 positions shown, 18 images · non-contrast
Comparison: October 06, 2017

CLINICAL DATA: Acute mental status change.

EXAM:
CT HEAD WITHOUT CONTRAST
TECHNIQUE: Contiguous axial images were obtained from the base of the skull
through the vertex without intravenous contrast.

[Series 5: head bone · axial · 0.43mm/px · z∈[-199,-51]mm · 8 of 90 slices shown]
[im 8/90  bone]
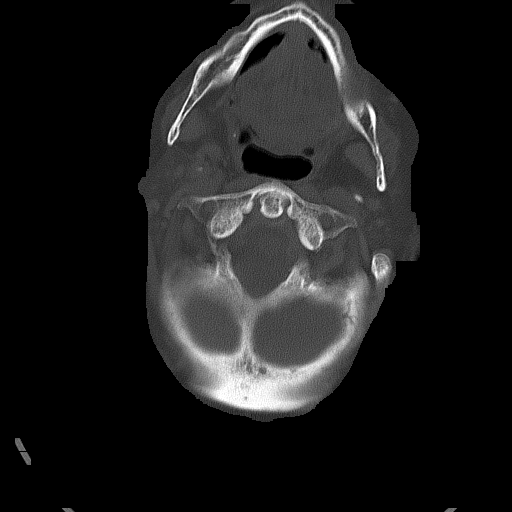
[im 23/90  bone]
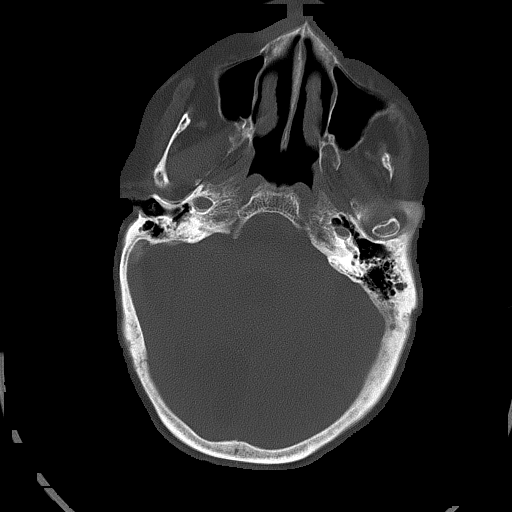
[im 30/90  bone]
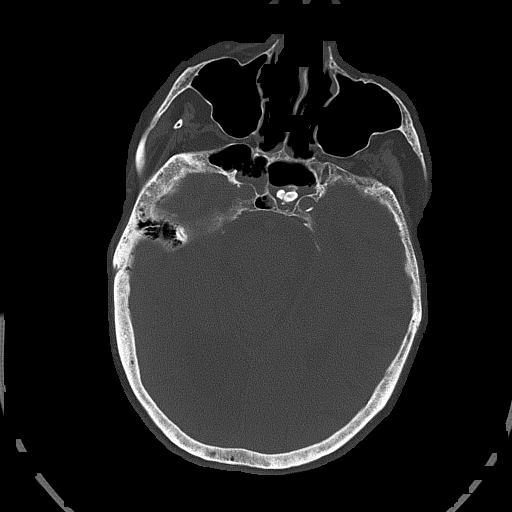
[im 38/90  bone]
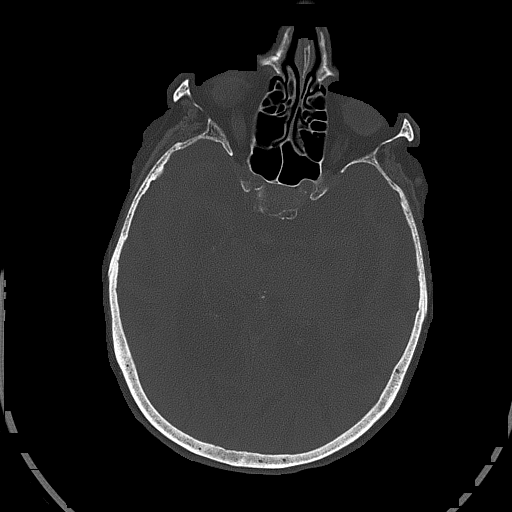
[im 52/90  bone]
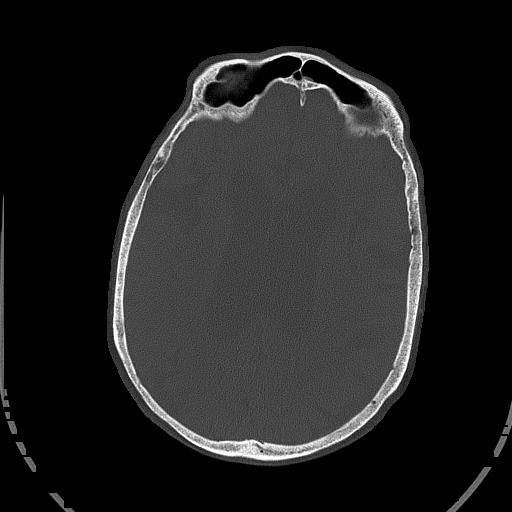
[im 60/90  bone]
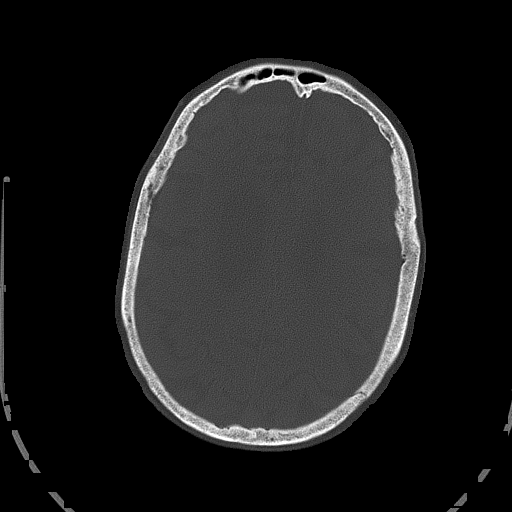
[im 67/90  bone]
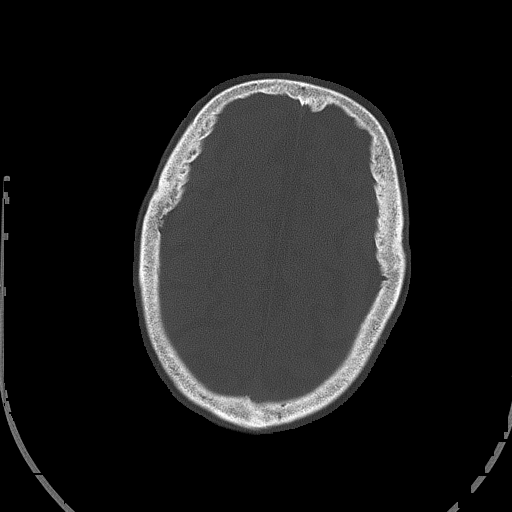
[im 82/90  bone]
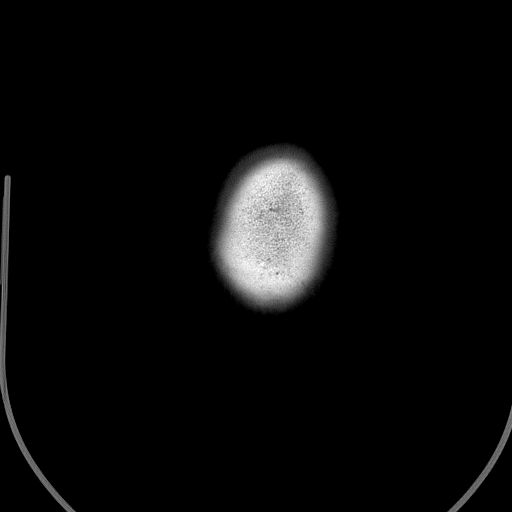

[Series 6: head without · axial · non-contrast · 0.43mm/px · z∈[-173,-83]mm · 3 of 36 slices shown, 4 images]
[im 9/36  brain]
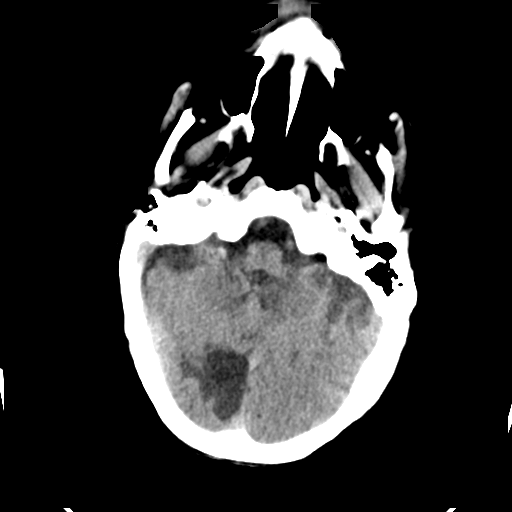
[im 9/36  bone]
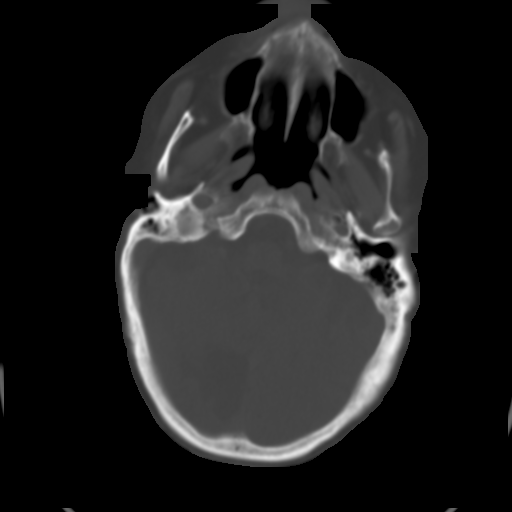
[im 18/36  brain]
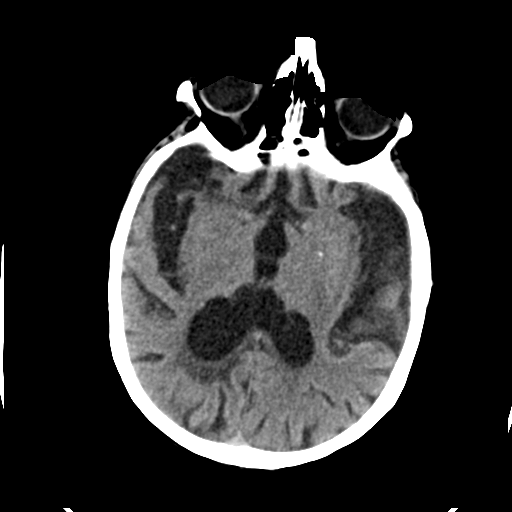
[im 27/36  brain]
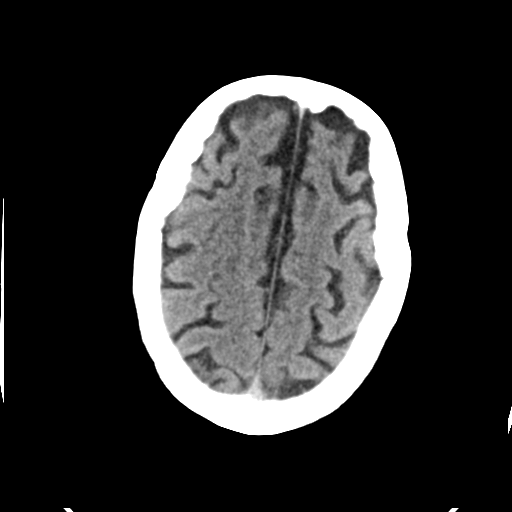

[Series 7: head without cor · coronal · non-contrast · 0.32mm/px · 3 of 68 slices shown]
[im 23/68  brain]
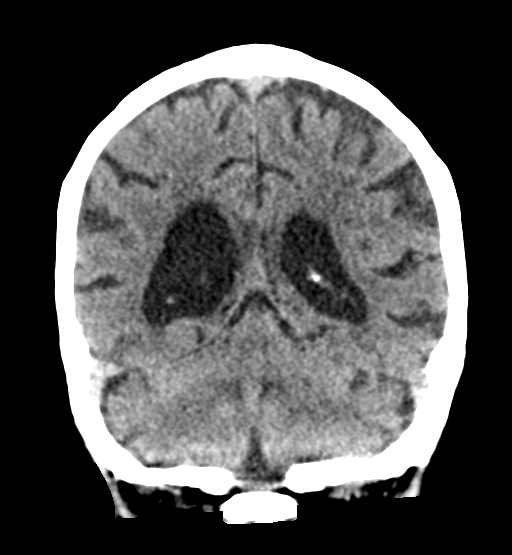
[im 30/68  brain]
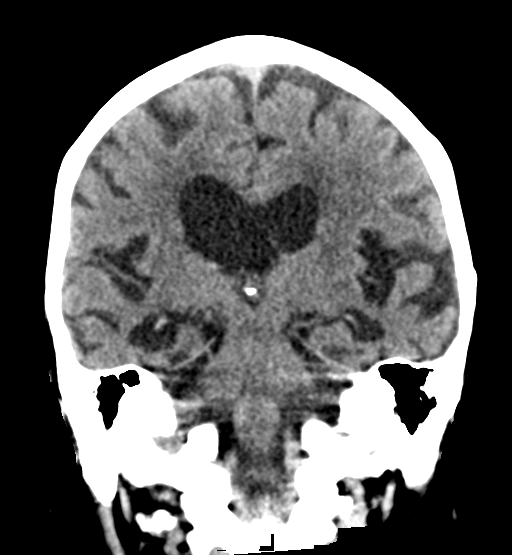
[im 38/68  brain]
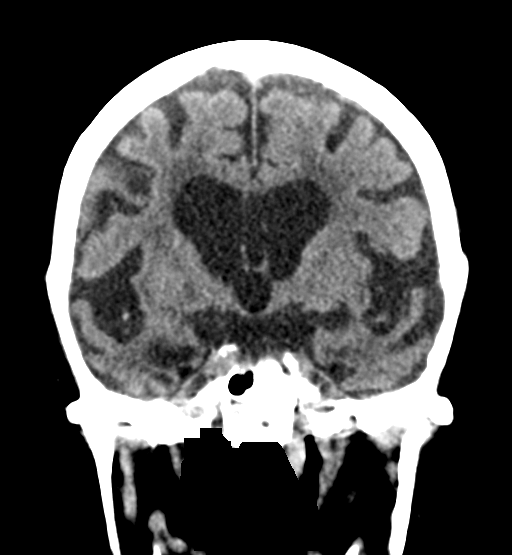

[Series 8: head without sag · sagittal · non-contrast · 0.35mm/px · 3 of 56 slices shown]
[im 19/56  brain]
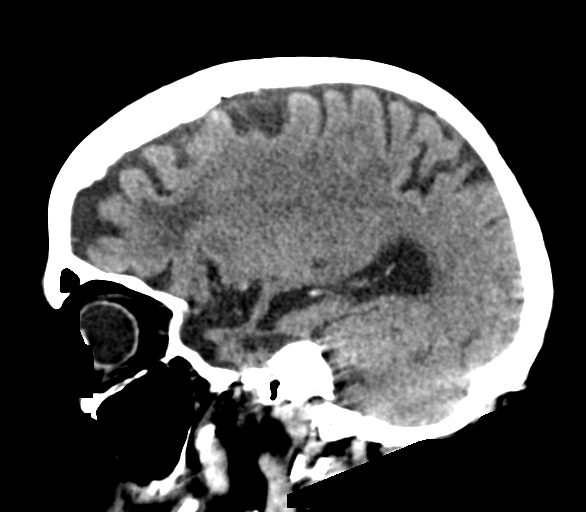
[im 28/56  brain]
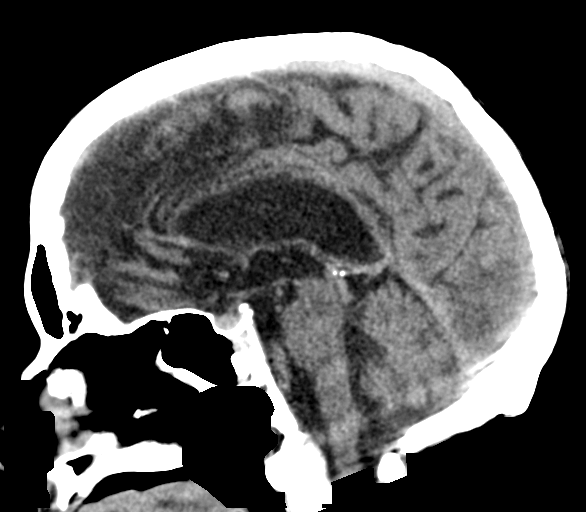
[im 37/56  brain]
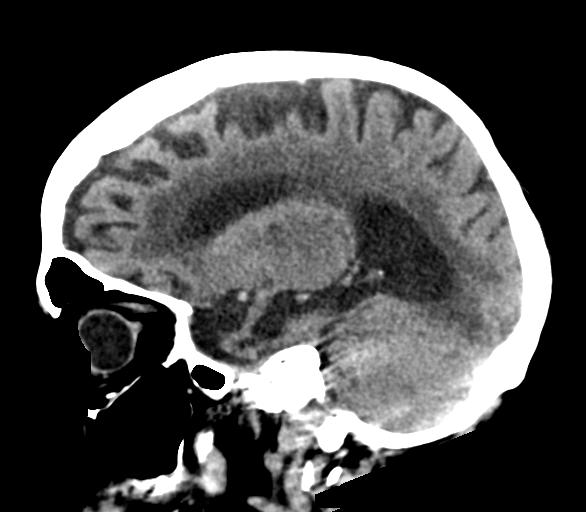

[17 of 47 positions shown; findings below may reference images not displayed]

FINDINGS: Brain: No subdural, epidural, or subarachnoid hemorrhage. Infarct is
seen in the right a septal lobe, unchanged with encephalomalacia. No
other infarcts. Moderate to severe white matter changes noted.
Ventricles are dilated and stable. Cerebellum, brainstem, and basal
cisterns are unremarkable. No acute cortical ischemia or infarct. No
mass effect or midline shift.

Vascular: Calcified atherosclerosis in the intracranial carotids.

Skull: Normal. Negative for fracture or focal lesion.

Sinuses/Orbits: Fluid is seen in the sphenoid sinuses. Paranasal
sinuses, mastoid air cells, and middle ears otherwise normal.

Other: None.
IMPRESSION: No acute intracranial abnormalities. Chronic right occipital infarct
and white matter changes.
